# Patient Record
Sex: Female | Born: 1955
Health system: Southern US, Community
[De-identification: ages and names within clinical notes are randomized; demographics above are authoritative.]

## PROBLEM LIST (undated history)

## (undated) DIAGNOSIS — E119 Type 2 diabetes mellitus without complications: Secondary | ICD-10-CM

## (undated) DIAGNOSIS — R112 Nausea with vomiting, unspecified: Secondary | ICD-10-CM

## (undated) DIAGNOSIS — Z9889 Other specified postprocedural states: Secondary | ICD-10-CM

## (undated) DIAGNOSIS — I10 Essential (primary) hypertension: Secondary | ICD-10-CM

## (undated) DIAGNOSIS — D229 Melanocytic nevi, unspecified: Secondary | ICD-10-CM

## (undated) DIAGNOSIS — C801 Malignant (primary) neoplasm, unspecified: Secondary | ICD-10-CM

## (undated) DIAGNOSIS — E785 Hyperlipidemia, unspecified: Secondary | ICD-10-CM

## (undated) DIAGNOSIS — H269 Unspecified cataract: Secondary | ICD-10-CM

## (undated) DIAGNOSIS — E079 Disorder of thyroid, unspecified: Secondary | ICD-10-CM

## (undated) DIAGNOSIS — T7840XA Allergy, unspecified, initial encounter: Secondary | ICD-10-CM

## (undated) HISTORY — DX: Unspecified cataract: H26.9

## (undated) HISTORY — DX: Allergy, unspecified, initial encounter: T78.40XA

## (undated) HISTORY — DX: Essential (primary) hypertension: I10

## (undated) HISTORY — DX: Malignant (primary) neoplasm, unspecified: C80.1

## (undated) HISTORY — DX: Disorder of thyroid, unspecified: E07.9

## (undated) HISTORY — PX: EYE SURGERY: SHX253

---
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
## 1978-04-01 HISTORY — PX: OOPHORECTOMY: SHX86

## 2000-02-05 ENCOUNTER — Other Ambulatory Visit: Admission: RE | Admit: 2000-02-05 | Discharge: 2000-02-05 | Payer: Self-pay | Admitting: Obstetrics & Gynecology

## 2001-04-13 ENCOUNTER — Other Ambulatory Visit: Admission: RE | Admit: 2001-04-13 | Discharge: 2001-04-13 | Payer: Self-pay | Admitting: Obstetrics & Gynecology

## 2002-08-03 ENCOUNTER — Other Ambulatory Visit: Admission: RE | Admit: 2002-08-03 | Discharge: 2002-08-03 | Payer: Self-pay | Admitting: Obstetrics & Gynecology

## 2003-09-07 ENCOUNTER — Other Ambulatory Visit: Admission: RE | Admit: 2003-09-07 | Discharge: 2003-09-07 | Payer: Self-pay | Admitting: Obstetrics & Gynecology

## 2004-02-21 ENCOUNTER — Ambulatory Visit: Payer: Self-pay | Admitting: Family Medicine

## 2004-05-28 ENCOUNTER — Ambulatory Visit: Payer: Self-pay | Admitting: Family Medicine

## 2004-08-07 ENCOUNTER — Ambulatory Visit: Payer: Self-pay | Admitting: Family Medicine

## 2004-11-19 ENCOUNTER — Ambulatory Visit: Payer: Self-pay | Admitting: Family Medicine

## 2004-11-22 ENCOUNTER — Ambulatory Visit: Payer: Self-pay | Admitting: Family Medicine

## 2005-11-08 ENCOUNTER — Ambulatory Visit: Payer: Self-pay | Admitting: Family Medicine

## 2005-12-05 ENCOUNTER — Ambulatory Visit: Payer: Self-pay | Admitting: Gastroenterology

## 2005-12-16 ENCOUNTER — Ambulatory Visit: Payer: Self-pay | Admitting: Gastroenterology

## 2005-12-16 LAB — HM COLONOSCOPY: HM Colonoscopy: NORMAL

## 2006-01-15 LAB — HM MAMMOGRAPHY: HM Mammogram: NORMAL

## 2006-10-20 ENCOUNTER — Ambulatory Visit: Payer: Self-pay | Admitting: Family Medicine

## 2006-10-20 DIAGNOSIS — E039 Hypothyroidism, unspecified: Secondary | ICD-10-CM | POA: Insufficient documentation

## 2006-10-20 DIAGNOSIS — J209 Acute bronchitis, unspecified: Secondary | ICD-10-CM | POA: Insufficient documentation

## 2006-10-27 ENCOUNTER — Telehealth (INDEPENDENT_AMBULATORY_CARE_PROVIDER_SITE_OTHER): Payer: Self-pay | Admitting: *Deleted

## 2006-11-25 ENCOUNTER — Telehealth (INDEPENDENT_AMBULATORY_CARE_PROVIDER_SITE_OTHER): Payer: Self-pay | Admitting: *Deleted

## 2006-12-04 ENCOUNTER — Ambulatory Visit: Payer: Self-pay | Admitting: Family Medicine

## 2006-12-04 ENCOUNTER — Telehealth (INDEPENDENT_AMBULATORY_CARE_PROVIDER_SITE_OTHER): Payer: Self-pay | Admitting: *Deleted

## 2006-12-05 ENCOUNTER — Encounter (INDEPENDENT_AMBULATORY_CARE_PROVIDER_SITE_OTHER): Payer: Self-pay | Admitting: *Deleted

## 2006-12-08 ENCOUNTER — Ambulatory Visit: Payer: Self-pay | Admitting: Family Medicine

## 2006-12-08 DIAGNOSIS — R21 Rash and other nonspecific skin eruption: Secondary | ICD-10-CM | POA: Insufficient documentation

## 2006-12-08 DIAGNOSIS — G43009 Migraine without aura, not intractable, without status migrainosus: Secondary | ICD-10-CM | POA: Insufficient documentation

## 2006-12-11 ENCOUNTER — Encounter (INDEPENDENT_AMBULATORY_CARE_PROVIDER_SITE_OTHER): Payer: Self-pay | Admitting: *Deleted

## 2006-12-16 ENCOUNTER — Encounter (INDEPENDENT_AMBULATORY_CARE_PROVIDER_SITE_OTHER): Payer: Self-pay | Admitting: *Deleted

## 2006-12-16 LAB — CONVERTED CEMR LAB
Alkaline Phosphatase: 74 units/L (ref 39–117)
BUN: 8 mg/dL (ref 6–23)
Basophils Relative: 0.3 % (ref 0.0–1.0)
Bilirubin, Direct: 0.1 mg/dL (ref 0.0–0.3)
CO2: 34 meq/L — ABNORMAL HIGH (ref 19–32)
Cholesterol: 176 mg/dL (ref 0–200)
GFR calc Af Amer: 97 mL/min
HDL: 47.1 mg/dL (ref >39.0)
Hemoglobin: 13.6 g/dL (ref 12.0–15.0)
Lymphocytes Relative: 27.1 % (ref 12.0–46.0)
MCHC: 34.8 g/dL (ref 30.0–36.0)
MCV: 92.8 fL (ref 78.0–100.0)
Monocytes Absolute: 0.8 10*3/uL — ABNORMAL HIGH (ref 0.2–0.7)
Monocytes Relative: 6.5 % (ref 3.0–11.0)
Neutro Abs: 7.7 10*3/uL (ref 1.4–7.7)
Potassium: 3.3 meq/L — ABNORMAL LOW (ref 3.5–5.1)
Total Protein: 7.3 g/dL (ref 6.0–8.3)

## 2006-12-29 ENCOUNTER — Ambulatory Visit: Payer: Self-pay | Admitting: Family Medicine

## 2006-12-31 ENCOUNTER — Encounter (INDEPENDENT_AMBULATORY_CARE_PROVIDER_SITE_OTHER): Payer: Self-pay | Admitting: *Deleted

## 2006-12-31 LAB — CONVERTED CEMR LAB
Basophils Relative: 0 % (ref 0.0–1.0)
Eosinophils Relative: 4.6 % (ref 0.0–5.0)
HCT: 38.6 % (ref 36.0–46.0)
MCV: 91.8 fL (ref 78.0–100.0)
Neutrophils Relative %: 62.4 % (ref 43.0–77.0)
RBC: 4.21 M/uL (ref 3.87–5.11)
RDW: 12.6 % (ref 11.5–14.6)
WBC: 9.6 10*3/uL (ref 4.5–10.5)

## 2007-01-13 LAB — CONVERTED CEMR LAB: Pap Smear: NORMAL

## 2007-02-24 ENCOUNTER — Ambulatory Visit: Payer: Self-pay | Admitting: Internal Medicine

## 2007-02-24 DIAGNOSIS — J019 Acute sinusitis, unspecified: Secondary | ICD-10-CM

## 2007-11-12 ENCOUNTER — Ambulatory Visit: Payer: Self-pay | Admitting: Family Medicine

## 2007-11-12 DIAGNOSIS — J309 Allergic rhinitis, unspecified: Secondary | ICD-10-CM | POA: Insufficient documentation

## 2007-11-13 ENCOUNTER — Encounter (INDEPENDENT_AMBULATORY_CARE_PROVIDER_SITE_OTHER): Payer: Self-pay | Admitting: *Deleted

## 2007-11-13 LAB — CONVERTED CEMR LAB: TSH: 5.11 microintl units/mL (ref 0.35–5.50)

## 2008-11-16 ENCOUNTER — Ambulatory Visit: Payer: Self-pay | Admitting: Family Medicine

## 2008-11-16 LAB — CONVERTED CEMR LAB
Bilirubin Urine: NEGATIVE
Blood in Urine, dipstick: NEGATIVE
Glucose, Urine, Semiquant: NEGATIVE
Ketones, urine, test strip: NEGATIVE
Specific Gravity, Urine: 1.015
pH: 6

## 2008-11-23 ENCOUNTER — Encounter (INDEPENDENT_AMBULATORY_CARE_PROVIDER_SITE_OTHER): Payer: Self-pay | Admitting: *Deleted

## 2008-11-23 LAB — CONVERTED CEMR LAB
AST: 22 units/L (ref 0–37)
Albumin: 4.4 g/dL (ref 3.5–5.2)
BUN: 9 mg/dL (ref 6–23)
Basophils Absolute: 0 10*3/uL (ref 0.0–0.1)
CO2: 32 meq/L (ref 19–32)
Calcium: 9.1 mg/dL (ref 8.4–10.5)
Direct LDL: 117.9 mg/dL
Eosinophils Absolute: 0.4 10*3/uL (ref 0.0–0.7)
Free T4: 0.8 ng/dL (ref 0.6–1.6)
GFR calc non Af Amer: 110.44 mL/min (ref >60)
Glucose, Bld: 91 mg/dL (ref 70–99)
HCT: 41.2 % (ref 36.0–46.0)
HDL: 38.4 mg/dL — ABNORMAL LOW (ref >39.00)
Lymphocytes Relative: 44.9 % (ref 12.0–46.0)
Lymphs Abs: 3 10*3/uL (ref 0.7–4.0)
MCHC: 34.3 g/dL (ref 30.0–36.0)
Monocytes Relative: 6.2 % (ref 3.0–12.0)
Platelets: 194 10*3/uL (ref 150.0–400.0)
RDW: 12.4 % (ref 11.5–14.6)
T3, Free: 2.7 pg/mL (ref 2.3–4.2)
TSH: 4.82 microintl units/mL (ref 0.35–5.50)
Total Bilirubin: 0.9 mg/dL (ref 0.3–1.2)
Triglycerides: 204 mg/dL — ABNORMAL HIGH (ref 0.0–149.0)

## 2008-12-26 ENCOUNTER — Ambulatory Visit: Payer: Self-pay | Admitting: Family Medicine

## 2008-12-26 DIAGNOSIS — I10 Essential (primary) hypertension: Secondary | ICD-10-CM

## 2009-01-19 ENCOUNTER — Ambulatory Visit: Payer: Self-pay | Admitting: Family Medicine

## 2009-02-05 IMAGING — CR DG CHEST 2V
2 series · 2 of 2 positions shown · non-contrast
Comparison: None.

CLINICAL DATA: Bronchitis.

CHEST - 2 VIEW  12/04/2006:

[view not recorded (1 of 2)]
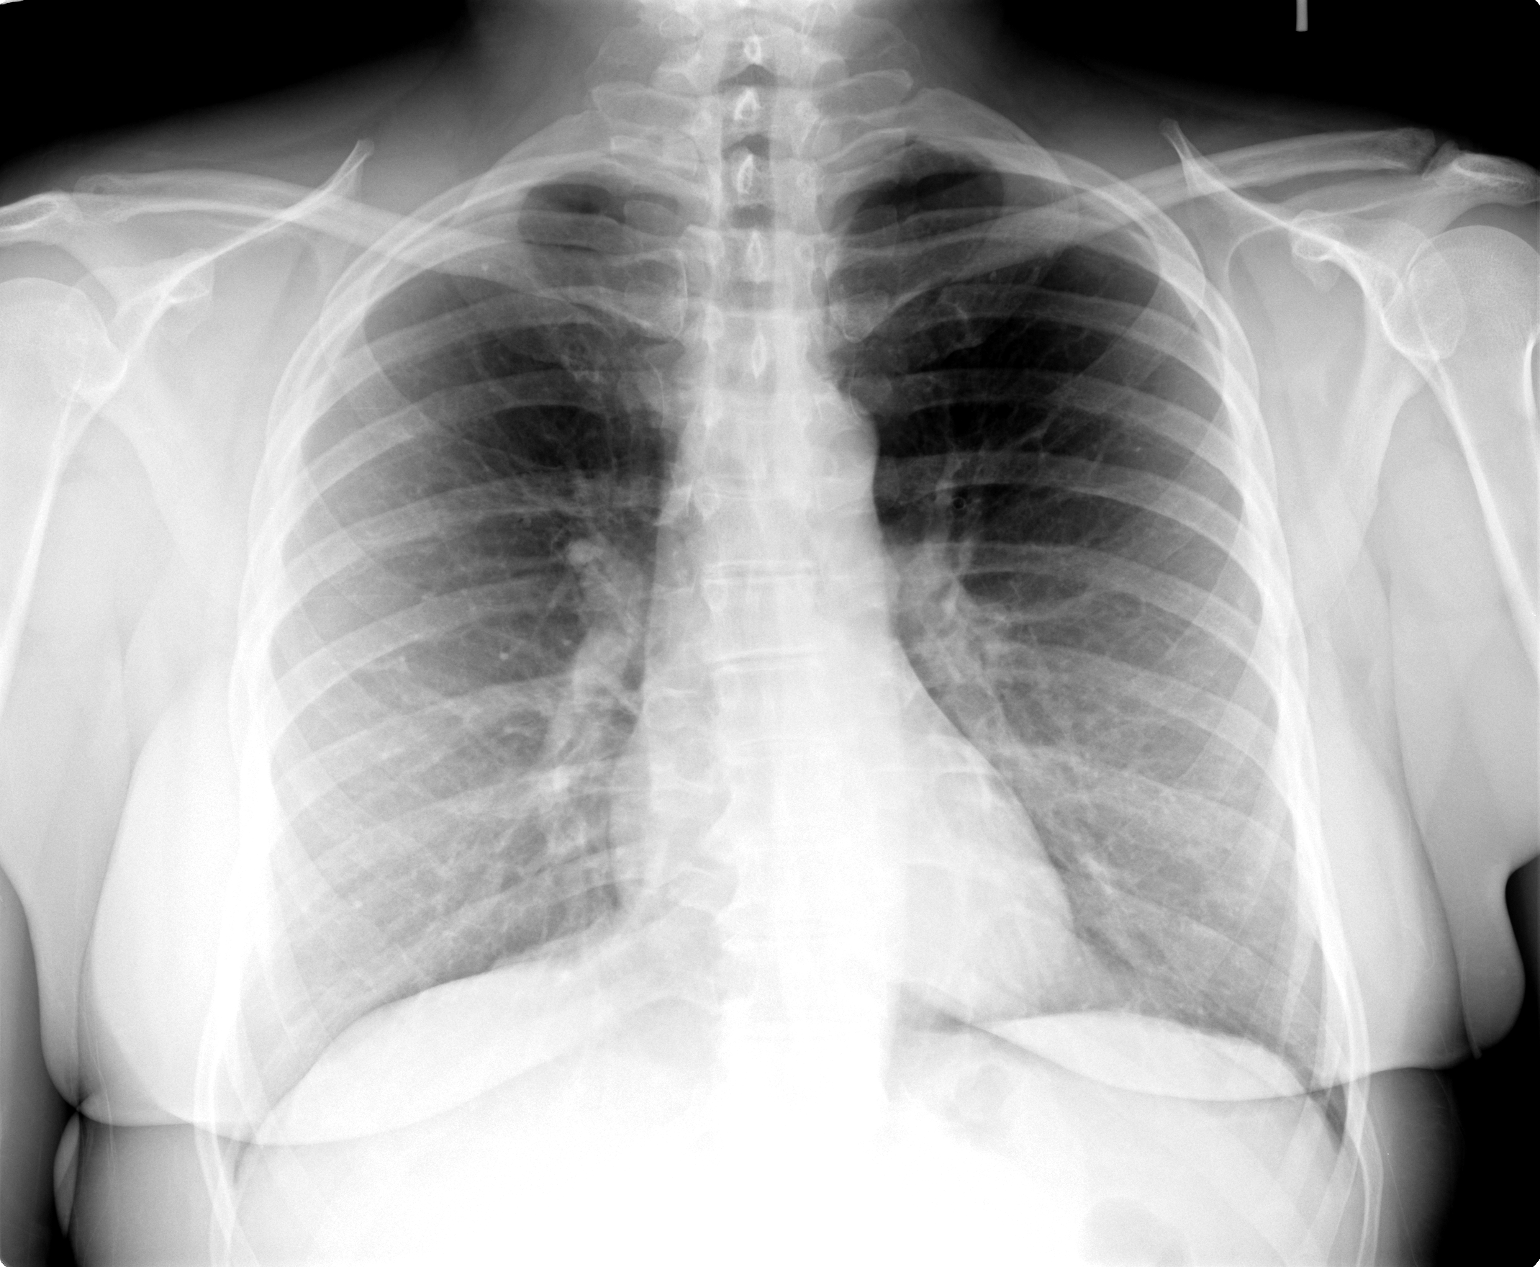

[view not recorded (2 of 2)]
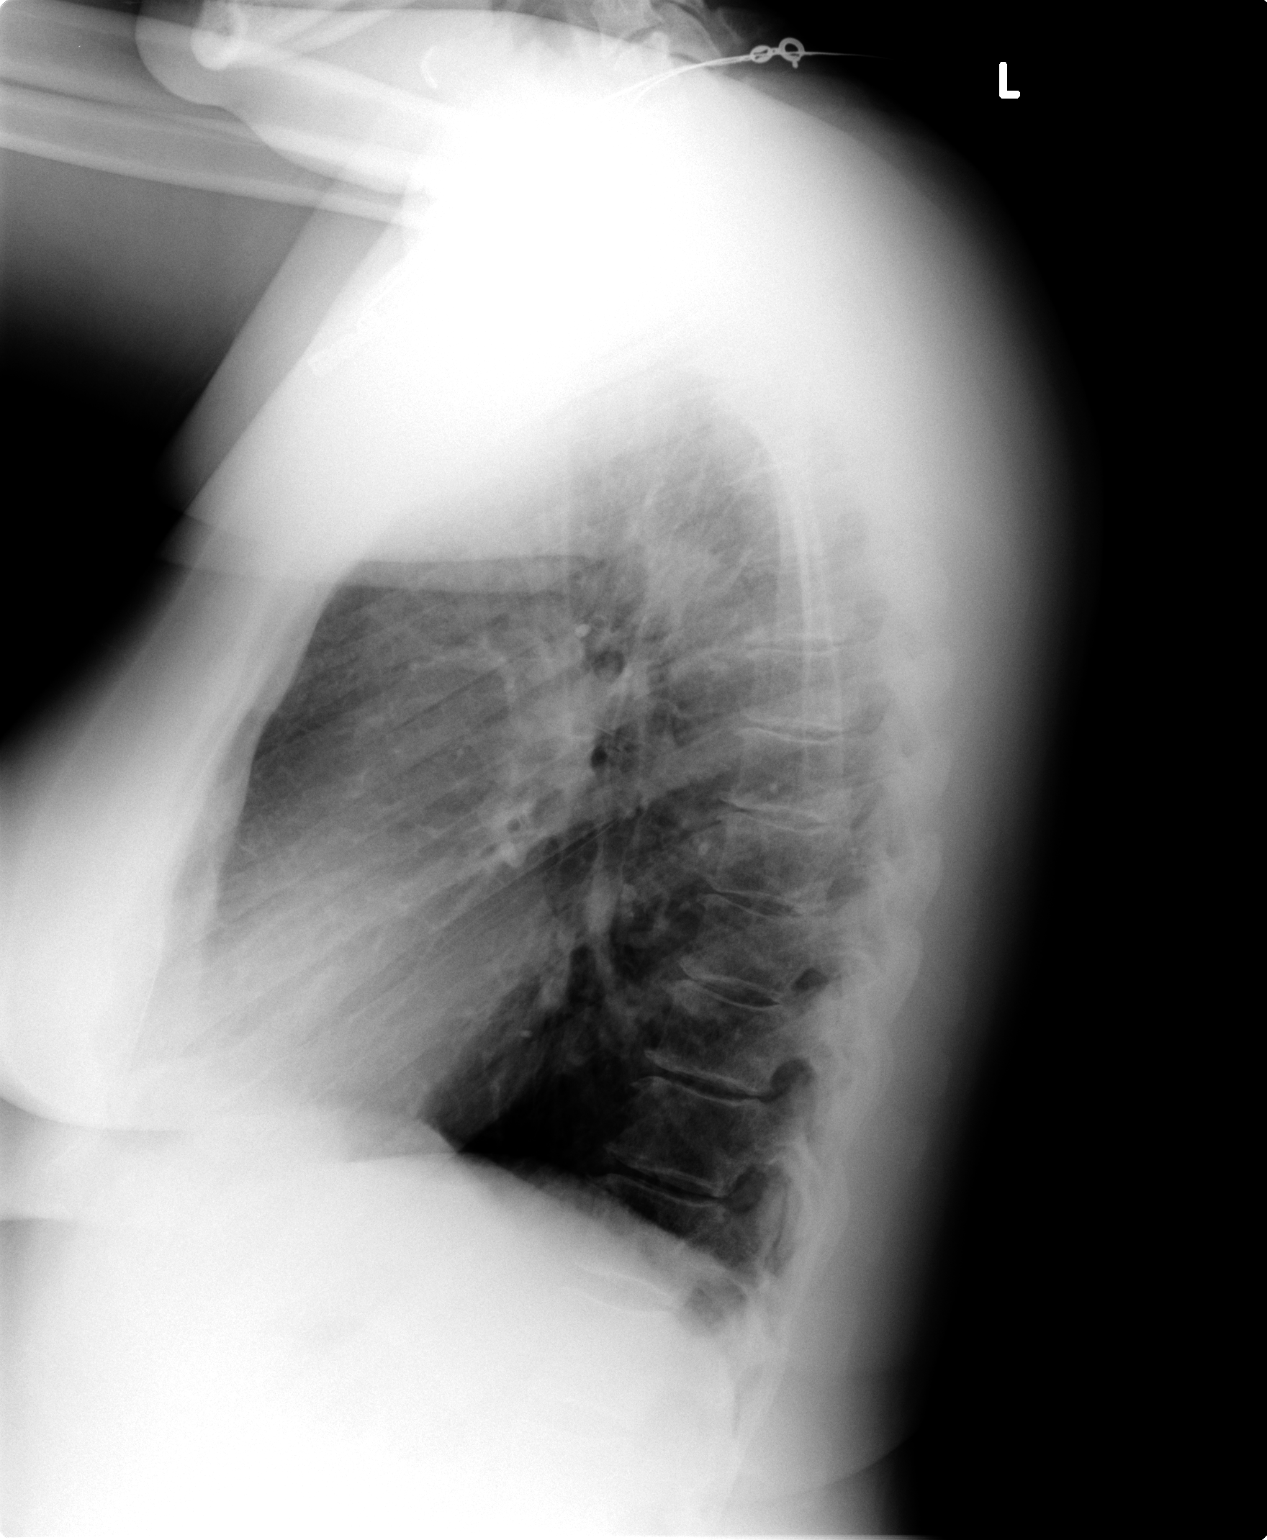

[2 of 2 positions shown; findings below may reference images not displayed]

FINDINGS: Cardiomediastinal silhouette unremarkable. Lungs clear.
Bronchovascular markings normal. No pleural effusions. Mild degenerative changes
throughout the thoracic spine.
IMPRESSION: No acute cardiopulmonary disease.

## 2009-02-17 ENCOUNTER — Ambulatory Visit: Payer: Self-pay | Admitting: Family Medicine

## 2009-03-01 ENCOUNTER — Telehealth (INDEPENDENT_AMBULATORY_CARE_PROVIDER_SITE_OTHER): Payer: Self-pay | Admitting: *Deleted

## 2009-03-01 LAB — CONVERTED CEMR LAB
AST: 20 units/L (ref 0–37)
Albumin: 4.4 g/dL (ref 3.5–5.2)
Cholesterol: 192 mg/dL (ref 0–200)
HDL: 38 mg/dL — ABNORMAL LOW (ref >39.00)
Total Bilirubin: 0.8 mg/dL (ref 0.3–1.2)
Total CHOL/HDL Ratio: 5
Triglycerides: 291 mg/dL — ABNORMAL HIGH (ref 0.0–149.0)
VLDL: 58.2 mg/dL — ABNORMAL HIGH (ref 0.0–40.0)

## 2009-07-05 ENCOUNTER — Telehealth (INDEPENDENT_AMBULATORY_CARE_PROVIDER_SITE_OTHER): Payer: Self-pay | Admitting: *Deleted

## 2009-10-16 ENCOUNTER — Ambulatory Visit: Payer: Self-pay | Admitting: Family Medicine

## 2009-10-16 DIAGNOSIS — E785 Hyperlipidemia, unspecified: Secondary | ICD-10-CM

## 2009-10-23 ENCOUNTER — Ambulatory Visit: Payer: Self-pay | Admitting: Family Medicine

## 2009-10-23 LAB — CONVERTED CEMR LAB
AST: 22 units/L (ref 0–37)
Albumin: 4.6 g/dL (ref 3.5–5.2)
Alkaline Phosphatase: 45 units/L (ref 39–117)
Bilirubin, Direct: 0.1 mg/dL (ref 0.0–0.3)
LDL Cholesterol: 81 mg/dL (ref 0–99)
Total Bilirubin: 0.3 mg/dL (ref 0.3–1.2)
Total CHOL/HDL Ratio: 4

## 2009-10-31 ENCOUNTER — Telehealth: Payer: Self-pay | Admitting: Family Medicine

## 2009-10-31 LAB — CONVERTED CEMR LAB: Free T4: 0.87 ng/dL (ref 0.60–1.60)

## 2009-12-26 ENCOUNTER — Telehealth (INDEPENDENT_AMBULATORY_CARE_PROVIDER_SITE_OTHER): Payer: Self-pay | Admitting: *Deleted

## 2010-02-05 ENCOUNTER — Ambulatory Visit: Payer: Self-pay | Admitting: Family Medicine

## 2010-02-07 ENCOUNTER — Ambulatory Visit: Payer: Self-pay | Admitting: Family Medicine

## 2010-02-13 LAB — CONVERTED CEMR LAB
ALT: 24 units/L (ref 0–35)
AST: 22 units/L (ref 0–37)
Alkaline Phosphatase: 45 units/L (ref 39–117)
BUN: 11 mg/dL (ref 6–23)
Chloride: 107 meq/L (ref 96–112)
Cholesterol: 130 mg/dL (ref 0–200)
GFR calc non Af Amer: 81.22 mL/min (ref >60)
Glucose, Bld: 114 mg/dL — ABNORMAL HIGH (ref 70–99)
LDL Cholesterol: 69 mg/dL (ref 0–99)
Potassium: 4.3 meq/L (ref 3.5–5.1)
Sodium: 142 meq/L (ref 135–145)
TSH: 3.44 microintl units/mL (ref 0.35–5.50)
Total Bilirubin: 0.7 mg/dL (ref 0.3–1.2)

## 2010-05-01 NOTE — Progress Notes (Signed)
Summary: BYSTOLIC REFILL  Phone Note Refill Request Message from:  Patient on December 26, 2009 9:55 AM  Refills Requested: Medication #1:  BYSTOLIC 10 MG TABS 1 by mouth once daily [facility]     HER DAYTIME PHONE IS [phone]  Initial call taken by: Jerolyn Shin,  December 26, 2009 9:55 AM  Follow-up for Phone Call        spoke with pharmacy rx not received on 10-23-09. rx verbally given over the phone per rx on 10-23-09..........Marland KitchenFelecia Deloach CMA  December 26, 2009 10:10 AM     Prescriptions: BYSTOLIC 10 MG TABS (NEBIVOLOL HCL) 1 by mouth once daily  #30 Tablet x 11   Entered by:   Jeremy Johann CMA   Authorized by:   Loreen Freud DO   Signed by:   Jeremy Johann CMA on 12/26/2009   Method used:   Telephoned to ...       [facility] [phone]* (retail)       [address]       [place], [place]  [address]       Ph: [phone]       Fax: [phone]   RxID:   [id number]

## 2010-05-01 NOTE — Progress Notes (Signed)
Summary: Synthroid increase  Phone Note Outgoing Call   Summary of Call: synthroid increase. Initial call taken by: Lucious Groves CMA,  October 31, 2009 10:52 AM    New/Updated Medications: LEVOTHROID 75 MCG TABS (LEVOTHYROXINE SODIUM) 1 by mouth qAM Prescriptions: LEVOTHROID 75 MCG TABS (LEVOTHYROXINE SODIUM) 1 by mouth qAM  #30 x 2   Entered by:   Lucious Groves CMA   Authorized by:   Loreen Freud DO   Signed by:   Lucious Groves CMA on 10/31/2009   Method used:   Electronically to        [facility] [phone]* (retail)       [address]       [place], [place]  [address]       Ph: [phone]       Fax: [phone]   RxID:   [id number]

## 2010-05-01 NOTE — Assessment & Plan Note (Signed)
Summary: follow-up BP,labs//fd   Vital Signs:  Patient profile:   55 year old female Weight:      206.4 pounds Pulse rate:   76 / minute Pulse rhythm:   regular BP sitting:   118 / 70  (right arm) Cuff size:   large  Vitals Entered By: Almeta Monas CMA Duncan Dull) (February 05, 2010 11:05 AM) CC: f/u on BP and meds-- No concerns   History of Present Illness:  Hypertension follow-up      This is a 55 year old woman who presents for Hypertension follow-up.  The patient denies lightheadedness, urinary frequency, headaches, edema, impotence, rash, and fatigue.  The patient denies the following associated symptoms: chest pain, chest pressure, exercise intolerance, dyspnea, palpitations, syncope, leg edema, and pedal edema.  Compliance with medications (by patient report) has been near 100%.  The patient reports that dietary compliance has been good.  The patient reports no exercise.  Adjunctive measures currently used by the patient include salt restriction.    Current Medications (verified): 1)  Bystolic 10 Mg Tabs (Nebivolol Hcl) .Marland Kitchen.. 1 By Mouth Once Daily 2)  Antara 130 Mg Caps (Fenofibrate Micronized) .... Take One At Bedtime 3)  Dexedrin Ephedra Free 4)  Essential Women 5)  Synthroid 75 Mcg Tabs (Levothyroxine Sodium) .Marland Kitchen.. 1 By Mouth Once Daily ** Labs Are Due**  Allergies (verified): 1)  ! Zithromax  Past History:  Past medical, surgical, family and social histories (including risk factors) reviewed for relevance to current acute and chronic problems.  Past Medical History: Reviewed history from 12/26/2008 and no changes required. Hypothyroidism Hypertension  Past Surgical History: Reviewed history from 12/08/2006 and no changes required. Oophorectomy (1980), R  Family History: Reviewed history from 12/08/2006 and no changes required. Family History Hypertension Family History Thyroid disease  Social History: Reviewed history from 12/08/2006 and no changes  required. 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 Lungs are clear to auscultation, no crackles or wheezes. Heart:  normal rate and no murmur.   Extremities:  trace left pedal edema and trace right pedal edema.   Psych:  Cognition and judgment appear intact. Alert and cooperative with normal attention span and concentration. No apparent delusions, illusions, hallucinations   Impression & Recommendations:  Problem # 1:  HYPERLIPIDEMIA (ICD-272.4)  Her updated medication list for this problem includes:    Antara 130 Mg Caps (Fenofibrate micronized) .Marland Kitchen... Take one at bedtime  Labs Reviewed: SGOT: 22 (10/16/2009)   SGPT: 27 (10/16/2009)   HDL:40.30 (10/16/2009), 38.00 (02/17/2009)  LDL:81 (10/16/2009), 93 (12/08/2006)  Chol:155 (10/16/2009), 192 (02/17/2009)  Trig:167.0 (10/16/2009), 291.0 (02/17/2009)  Problem # 2:  HYPERTENSION (ICD-401.9)  Her updated medication list for this problem includes:    Bystolic 10 Mg Tabs (Nebivolol hcl) .Marland Kitchen... 1 by mouth once daily  BP today: 118/70 Prior BP: 130/70 (10/23/2009)  Labs Reviewed: K+: 3.4 (11/16/2008) Creat: : 0.6 (11/16/2008)   Chol: 155 (10/16/2009)   HDL: 40.30 (10/16/2009)   LDL: 81 (10/16/2009)   TG: 167.0 (10/16/2009)  Problem # 3:  HYPOTHYROIDISM (ICD-244.9)  Her updated medication list for this problem includes:    Synthroid 75 Mcg Tabs (Levothyroxine sodium) .Marland Kitchen... 1 by mouth once daily ** labs are due**  Labs Reviewed: TSH: 6.10 (10/23/2009)  Chol: 155 (10/16/2009)   HDL: 40.30 (10/16/2009)   LDL: 81 (10/16/2009)   TG: 167.0 (10/16/2009)  Complete Medication List: 1)  Bystolic 10 Mg Tabs (Nebivolol hcl) .Marland Kitchen.. 1 by  mouth once daily 2)  Antara 130 Mg Caps (Fenofibrate micronized) .... Take one at bedtime 3)  Dexedrin Ephedra Free  4)  Essential Women  5)  Synthroid 75 Mcg Tabs (Levothyroxine sodium) .Marland Kitchen.. 1 by mouth once daily ** labs are due**  Other Orders: Venipuncture ([address]242) TLB-Lipid Panel (80061-LIPID) TLB-BMP (Basic Metabolic Panel-BMET) (80048-METABOL) TLB-Hepatic/Liver Function Pnl (80076-HEPATIC) TLB-TSH (Thyroid Stimulating Hormone) (84443-TSH)  Patient Instructions: 1)  272.4  401.9  244.9   TSH, lipid, bmp, hep-----fasting labs 2)  Please schedule a follow-up appointment in 6 months .  Prescriptions: SYNTHROID 75 MCG TABS (LEVOTHYROXINE SODIUM) 1 by mouth once daily ** Labs are due**  #30 x 11   Entered and Authorized by:   Loreen Freud DO   Signed by:   Loreen Freud DO on 02/05/2010   Method used:   Electronically to        [facility] [phone]* (retail)       [address]       [place], [place]  [address]       Ph: [phone]       Fax: [phone]   RxID:   [id number] Lestine Mount 130 MG CAPS (FENOFIBRATE MICRONIZED) take one at bedtime  #30 x 5   Entered and Authorized by:   Loreen Freud DO   Signed by:   Loreen Freud DO on 02/05/2010   Method used:   Electronically to        [facility] [phone]* (retail)       [address]       [place], [place]  [address]       Ph: [phone]       Fax: [phone]   RxID:   [id number] BYSTOLIC 10 MG TABS (NEBIVOLOL HCL) 1 by mouth once daily  #90 x 3   Entered and Authorized by:   Loreen Freud DO   Signed by:   Loreen Freud DO on 02/05/2010   Method used:   Electronically to        [facility] [phone]* (retail)       [address]       [place], [place]  [address]       Ph: [phone]       Fax: [phone]   RxID:   [id number]    Orders Added: 1)  Venipuncture [70962] 2)  TLB-Lipid Panel [80061-LIPID] 3)  TLB-BMP  (Basic Metabolic Panel-BMET) [80048-METABOL] 4)  TLB-Hepatic/Liver Function Pnl [80076-HEPATIC] 5)  TLB-TSH (Thyroid Stimulating Hormone) [84443-TSH] 6)  Est. Patient Level III [83662]     Flu Vaccine Next Due:  Refused

## 2010-05-01 NOTE — Assessment & Plan Note (Signed)
Summary: followup on lab and bp/alr   Vital Signs:  Patient profile:   55 year old female Height:      65.75 inches Weight:      208 pounds BMI:     33.95 Temp:     98.2 degrees F oral Pulse rate:   76 / minute BP sitting:   130 / 70  (left arm)  Vitals Entered By: Jeremy Johann CMA (October 23, 2009 9:51 AM) CC: F/U LABS, BP CHECK   History of Present Illness:  Hypertension follow-up      This is a 55 year old woman who presents for Hypertension follow-up.  The patient denies lightheadedness, urinary frequency, headaches, edema, impotence, rash, and fatigue.  The patient denies the following associated symptoms: chest pain, chest pressure, exercise intolerance, dyspnea, palpitations, syncope, leg edema, and pedal edema.  Compliance with medications (by patient report) has been near 100%.  The patient reports that dietary compliance has been good.  Adjunctive measures currently used by the patient include salt restriction.    Hyperlipidemia follow-up      The patient also presents for Hyperlipidemia follow-up.  The patient denies muscle aches, GI upset, abdominal pain, flushing, itching, constipation, diarrhea, and fatigue.  The patient denies the following symptoms: chest pain/pressure, exercise intolerance, dypsnea, palpitations, syncope, and pedal edema.  Compliance with medications (by patient report) has been near 100%.  Dietary compliance has been good.  The patient reports no exercise.    Current Medications (verified): 1)  Levothroid 50 Mcg  Tabs (Levothyroxine Sodium) .Marland Kitchen.. 1 By Mouth Once Daily 2)  Bystolic 10 Mg Tabs (Nebivolol Hcl) .Marland Kitchen.. 1 By Mouth Once Daily 3)  Antara 130 Mg Caps (Fenofibrate Micronized) .... Take One At Bedtime 4)  Dexedrin Ephedra Free 5)  Essential Women  Allergies (verified): 1)  ! Zithromax  Past History:  Past medical, surgical, family and social histories (including risk factors) reviewed for relevance to current acute and chronic  problems.  Past Medical History: Reviewed history from 12/26/2008 and no changes required. Hypothyroidism Hypertension  Past Surgical History: Reviewed history from 12/08/2006 and no changes required. Oophorectomy (1980), R  Family History: Reviewed history from 12/08/2006 and no changes required. Family History Hypertension Family History Thyroid disease  Social History: Reviewed history from 12/08/2006 and no changes required. 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 Lungs:  Normal respiratory effort, chest expands symmetrically. Lungs are clear to auscultation, no crackles or wheezes. Heart:  normal rate and no murmur.   Extremities:  No clubbing, cyanosis, edema, or deformity noted with normal full range of motion of all joints.   Psych:  Cognition and judgment appear intact. Alert and cooperative with normal attention span and concentration. No apparent delusions, illusions, hallucinations   Impression & Recommendations:  Problem # 1:  HYPERLIPIDEMIA (ICD-272.4)  Her updated medication list for this problem includes:    Antara 130 Mg Caps (Fenofibrate micronized) .Marland Kitchen... Take one at bedtime  Labs Reviewed: SGOT: 20 (02/17/2009)   SGPT: 24 (02/17/2009)   HDL:38.00 (02/17/2009), 38.40 (11/16/2008)  LDL:93 (12/08/2006)  Chol:192 (02/17/2009), 186 (11/16/2008)  Trig:291.0 (02/17/2009), 204.0 (11/16/2008)  Problem # 2:  HYPERTENSION (ICD-401.9)  Her updated medication list for this problem includes:    Bystolic 10 Mg  Tabs (Nebivolol hcl) .Marland Kitchen... 1 by mouth once daily  BP today: 130/70 Prior BP: 126/86 (02/17/2009)  Labs Reviewed: K+: 3.4 (11/16/2008) Creat: : 0.6 (11/16/2008)    Chol: 192 (02/17/2009)   HDL: 38.00 (02/17/2009)   LDL: 93 (12/08/2006)   TG: 291.0 (02/17/2009)  Problem # 3:  HYPOTHYROIDISM (ICD-244.9)  Her updated medication list for this problem includes:    Levothroid 50 Mcg Tabs (Levothyroxine sodium) .Marland Kitchen... 1 by mouth once daily  Orders: Venipuncture (81191) TLB-TSH (Thyroid Stimulating Hormone) (84443-TSH) TLB-T3, Free (Triiodothyronine) (84481-T3FREE) TLB-T4 (Thyrox), Free [phone]) Specimen Handling (30865)  Labs Reviewed: TSH: 4.82 (11/16/2008)    Chol: 192 (02/17/2009)   HDL: 38.00 (02/17/2009)   LDL: 93 (12/08/2006)   TG: 291.0 (02/17/2009)  Complete Medication List: 1)  Levothroid 50 Mcg Tabs (Levothyroxine sodium) .Marland Kitchen.. 1 by mouth once daily 2)  Bystolic 10 Mg Tabs (Nebivolol hcl) .Marland Kitchen.. 1 by mouth once daily 3)  Antara 130 Mg Caps (Fenofibrate micronized) .... Take one at bedtime 4)  Dexedrin Ephedra Free  5)  Essential Women

## 2010-05-01 NOTE — Progress Notes (Signed)
Summary: WHEN IS PT DUE FOR OV?  /Dr Laury Axon see  Phone Note Call from Patient   Caller: Patient Summary of Call: pt called says she is almost out of her BP med Bystolic. -Mentioned to pt due for 3mos followup on her triglycerides, pt never started her rx for Antara, after finishing her samples never got rx Explained to pt triglycerides were high and needed to be on med, [facility] along with rx for Bystolic DR Lowne since pt did not start her Antara and will pickup today. WHEN DO YOU WANT PT TO COME IN FOR LAB AND FOLLOWUP ON BP? Initial call taken by: Kandice Hams,  July 05, 2009 3:39 PM  Follow-up for Phone Call        3 months --- labs 1 week before ov Follow-up by: Loreen Freud DO,  July 06, 2009 10:52 AM  Additional Follow-up for Phone Call Additional follow up Details #1::        pt informed and lab and ov scheduled .Kandice Hams  July 06, 2009 11:40 AM  Additional Follow-up by: Kandice Hams,  July 06, 2009 11:40 AM    Prescriptions: ANTARA 130 MG CAPS (FENOFIBRATE MICRONIZED) take one at bedtime  #30 x 2   Entered by:   Kandice Hams   Authorized by:   Loreen Freud DO   Signed by:   Kandice Hams on 07/05/2009   Method used:   Faxed to ...       [facility] [phone]* (retail)       [address]       [place], [place]  [address]       Ph: [phone]       Fax: [phone]   RxID:   [id number] BYSTOLIC 10 MG TABS (NEBIVOLOL HCL) 1 by mouth once daily  #30 x 2   Entered by:   Kandice Hams   Authorized by:   Loreen Freud DO   Signed by:   Kandice Hams on 07/05/2009   Method used:   Faxed to ...       [facility] [phone]* (retail)       [address]       [place], [place]  [address]       Ph: [phone]       Fax: [phone]   RxID:   [id number]

## 2010-09-10 ENCOUNTER — Other Ambulatory Visit: Payer: Self-pay | Admitting: Family Medicine

## 2010-09-11 MED ORDER — FENOFIBRATE MICRONIZED 130 MG PO CAPS: 130.0000 mg | ORAL_CAPSULE | Freq: Every day | ORAL | Status: DC

## 2010-10-10 ENCOUNTER — Encounter: Payer: Self-pay | Admitting: Family Medicine

## 2010-10-12 ENCOUNTER — Ambulatory Visit (INDEPENDENT_AMBULATORY_CARE_PROVIDER_SITE_OTHER): Payer: Managed Care, Other (non HMO) | Admitting: Family Medicine

## 2010-10-12 ENCOUNTER — Encounter: Payer: Self-pay | Admitting: Family Medicine

## 2010-10-12 VITALS — BP 124/82 | HR 69 | Temp 98.7°F | Ht 65.5 in | Wt 209.2 lb

## 2010-10-12 DIAGNOSIS — E039 Hypothyroidism, unspecified: Secondary | ICD-10-CM

## 2010-10-12 DIAGNOSIS — I10 Essential (primary) hypertension: Secondary | ICD-10-CM

## 2010-10-12 DIAGNOSIS — E785 Hyperlipidemia, unspecified: Secondary | ICD-10-CM

## 2010-10-12 DIAGNOSIS — Z0000 Encounter for general adult medical examination without abnormal findings: Secondary | ICD-10-CM

## 2010-10-12 MED ORDER — LEVOTHYROXINE SODIUM 75 MCG PO TABS: 75.0000 ug | ORAL_TABLET | Freq: Every day | ORAL | Status: DC

## 2010-10-12 MED ORDER — NEBIVOLOL HCL 10 MG PO TABS: 10.0000 mg | ORAL_TABLET | Freq: Every day | ORAL | Status: DC

## 2010-10-12 MED ORDER — FENOFIBRATE MICRONIZED 130 MG PO CAPS: 130.0000 mg | ORAL_CAPSULE | Freq: Every day | ORAL | Status: DC

## 2010-10-12 NOTE — Patient Instructions (Signed)

## 2010-10-12 NOTE — Progress Notes (Signed)
  Subjective:     Diane Hodges is a 55 y.o. female and is here for a comprehensive physical exam. The patient reports incontinence--stress  History   Social History  . Marital Status: Married    Spouse Name: N/A    Number of Children: N/A  . Years of Education: N/A   Occupational History  . Not on file.   Social History Main Topics  . Smoking status: Never Smoker   . Smokeless tobacco: Never Used  . Alcohol Use: No  . Drug Use: No  . Sexually Active: Yes -- Female partner(s)   Other Topics Concern  . Not on file   Social History Narrative  . No narrative on file   Health Maintenance  Topic Date Due  . Influenza Vaccine  12/31/2010  . Mammogram  04/14/2011  . Tetanus/tdap  08/13/2011  . Pap Smear  04/13/2012  . Colonoscopy  12/17/2015    The following portions of the patient's history were reviewed and updated as appropriate: allergies, current medications, past family history, past medical history, past social history, past surgical history and problem list.  Review of Systems Review of Systems  Constitutional: Negative for activity change, appetite change and fatigue.  HENT: Negative for hearing loss, congestion, tinnitus and ear discharge.  dentist q60m Eyes: Negative for visual disturbance (see optho q1y -- vision corrected to 20/20 with glasses).  Respiratory: Negative for cough, chest tightness and shortness of breath.   Cardiovascular: Negative for chest pain, palpitations and leg swelling.  Gastrointestinal: Negative for abdominal pain, diarrhea, constipation and abdominal distention.  Genitourinary: Negative for urgency, frequency, decreased urine volume and difficulty urinating.  Musculoskeletal: Negative for back pain, arthralgias and gait problem.  Skin: Negative for color change, pallor and rash.  Neurological: Negative for dizziness, light-headedness, numbness and headaches.  Hematological: Negative for adenopathy. Does not bruise/bleed easily.    Psychiatric/Behavioral: Negative for suicidal ideas, confusion, sleep disturbance, self-injury, dysphoric mood, decreased concentration and agitation.       Objective:    BP 124/82  Pulse 69  Temp(Src) 98.7 F (37.1 C) (Oral)  Ht 5' 5.5" (1.664 m)  Wt 209 lb 3.2 oz (94.892 kg)  BMI 34.28 kg/m2  SpO2 98% General appearance: alert, cooperative, appears stated age and no distress Head: Normocephalic, without obvious abnormality, atraumatic Eyes: conjunctivae/corneas clear. PERRL, EOM's intact. Fundi benign. Ears: normal TM's and external ear canals both ears Nose: Nares normal. Septum midline. Mucosa normal. No drainage or sinus tenderness. Throat: lips, mucosa, and tongue normal; teeth and gums normal Neck: no adenopathy, no carotid bruit, no JVD, supple, symmetrical, trachea midline and thyroid not enlarged, symmetric, no tenderness/mass/nodules Lungs: clear to auscultation bilaterally Breasts: gyn Heart: regular rate and rhythm, S1, S2 normal, no murmur, click, rub or gallop Abdomen: soft, non-tender; bowel sounds normal; no masses,  no organomegaly Pelvic: gyn Extremities: extremities normal, atraumatic, no cyanosis or edema Pulses: 2+ and symmetric Skin: Skin color, texture, turgor normal. No rashes or lesions Lymph nodes: Cervical, supraclavicular, and axillary nodes normal. Neurologic: Alert and oriented X 3, normal strength and tone. Normal symmetric reflexes. Normal coordination and gait psych-- no depression, no anxiety    Assessment:    Healthy female exam.  HTN Hypothyroidism hypertriglyceridemia   Plan:     ghm utd Check fasting labs con't med See After Visit Summary for Counseling Recommendations

## 2010-11-12 ENCOUNTER — Other Ambulatory Visit: Payer: Self-pay | Admitting: Family Medicine

## 2010-11-12 DIAGNOSIS — I10 Essential (primary) hypertension: Secondary | ICD-10-CM

## 2010-11-12 DIAGNOSIS — E785 Hyperlipidemia, unspecified: Secondary | ICD-10-CM

## 2010-11-12 DIAGNOSIS — E039 Hypothyroidism, unspecified: Secondary | ICD-10-CM

## 2010-11-12 DIAGNOSIS — Z0000 Encounter for general adult medical examination without abnormal findings: Secondary | ICD-10-CM

## 2010-11-13 ENCOUNTER — Other Ambulatory Visit (INDEPENDENT_AMBULATORY_CARE_PROVIDER_SITE_OTHER): Payer: Managed Care, Other (non HMO)

## 2010-11-13 DIAGNOSIS — I10 Essential (primary) hypertension: Secondary | ICD-10-CM

## 2010-11-13 DIAGNOSIS — Z0000 Encounter for general adult medical examination without abnormal findings: Secondary | ICD-10-CM

## 2010-11-13 DIAGNOSIS — E785 Hyperlipidemia, unspecified: Secondary | ICD-10-CM

## 2010-11-13 DIAGNOSIS — R319 Hematuria, unspecified: Secondary | ICD-10-CM

## 2010-11-13 DIAGNOSIS — E039 Hypothyroidism, unspecified: Secondary | ICD-10-CM

## 2010-11-13 DIAGNOSIS — N390 Urinary tract infection, site not specified: Secondary | ICD-10-CM

## 2010-11-13 LAB — POCT URINALYSIS DIPSTICK
Bilirubin, UA: NEGATIVE
Glucose, UA: NEGATIVE
Ketones, UA: NEGATIVE
Nitrite, UA: NEGATIVE
Protein, UA: NEGATIVE
Spec Grav, UA: 1.015
Urobilinogen, UA: 0.2
pH, UA: 6.5

## 2010-11-13 LAB — CBC WITH DIFFERENTIAL/PLATELET
Basophils Absolute: 0 K/uL (ref 0.0–0.1)
Basophils Relative: 0.6 % (ref 0.0–3.0)
Eosinophils Absolute: 0.2 K/uL (ref 0.0–0.7)
Eosinophils Relative: 2.8 % (ref 0.0–5.0)
HCT: 38.9 % (ref 36.0–46.0)
Hemoglobin: 13.1 g/dL (ref 12.0–15.0)
Lymphocytes Relative: 39.2 % (ref 12.0–46.0)
Lymphs Abs: 2.4 K/uL (ref 0.7–4.0)
MCHC: 33.6 g/dL (ref 30.0–36.0)
MCV: 95.1 fl (ref 78.0–100.0)
Monocytes Absolute: 0.4 K/uL (ref 0.1–1.0)
Monocytes Relative: 6.3 % (ref 3.0–12.0)
Neutro Abs: 3.1 K/uL (ref 1.4–7.7)
Neutrophils Relative %: 51.1 % (ref 43.0–77.0)
Platelets: 188 K/uL (ref 150.0–400.0)
RBC: 4.09 Mil/uL (ref 3.87–5.11)
RDW: 13.2 % (ref 11.5–14.6)
WBC: 6 K/uL (ref 4.5–10.5)

## 2010-11-13 LAB — HEPATIC FUNCTION PANEL
ALT: 23 U/L (ref 0–35)
AST: 21 U/L (ref 0–37)
Albumin: 4.4 g/dL (ref 3.5–5.2)
Alkaline Phosphatase: 40 U/L (ref 39–117)
Bilirubin, Direct: 0 mg/dL (ref 0.0–0.3)
Total Bilirubin: 0.4 mg/dL (ref 0.3–1.2)
Total Protein: 7 g/dL (ref 6.0–8.3)

## 2010-11-13 LAB — BASIC METABOLIC PANEL WITH GFR
BUN: 11 mg/dL (ref 6–23)
CO2: 27 meq/L (ref 19–32)
Calcium: 9.1 mg/dL (ref 8.4–10.5)
Chloride: 107 meq/L (ref 96–112)
Creatinine, Ser: 0.7 mg/dL (ref 0.4–1.2)
GFR: 88.84 mL/min
Glucose, Bld: 122 mg/dL — ABNORMAL HIGH (ref 70–99)
Potassium: 3.8 meq/L (ref 3.5–5.1)
Sodium: 142 meq/L (ref 135–145)

## 2010-11-13 LAB — LIPID PANEL
HDL: 44.3 mg/dL (ref >39.00)
Total CHOL/HDL Ratio: 3

## 2010-11-13 NOTE — Progress Notes (Signed)
Labs only

## 2010-11-13 NOTE — Progress Notes (Signed)
12  

## 2010-11-15 ENCOUNTER — Encounter: Payer: Self-pay | Admitting: Family Medicine

## 2010-11-15 LAB — URINE CULTURE: Colony Count: 40000

## 2011-01-04 ENCOUNTER — Other Ambulatory Visit: Payer: Self-pay | Admitting: Family Medicine

## 2011-01-04 DIAGNOSIS — R7989 Other specified abnormal findings of blood chemistry: Secondary | ICD-10-CM

## 2011-01-07 ENCOUNTER — Other Ambulatory Visit (INDEPENDENT_AMBULATORY_CARE_PROVIDER_SITE_OTHER): Payer: Managed Care, Other (non HMO)

## 2011-01-07 DIAGNOSIS — R7989 Other specified abnormal findings of blood chemistry: Secondary | ICD-10-CM

## 2011-01-07 LAB — HEMOGLOBIN A1C: Hgb A1c MFr Bld: 6.2 % (ref 4.6–6.5)

## 2011-01-07 NOTE — Progress Notes (Signed)
12  

## 2011-02-27 ENCOUNTER — Other Ambulatory Visit: Payer: Self-pay | Admitting: Family Medicine

## 2011-02-27 DIAGNOSIS — E785 Hyperlipidemia, unspecified: Secondary | ICD-10-CM

## 2011-02-27 DIAGNOSIS — R7989 Other specified abnormal findings of blood chemistry: Secondary | ICD-10-CM

## 2011-02-27 DIAGNOSIS — I10 Essential (primary) hypertension: Secondary | ICD-10-CM

## 2011-03-01 ENCOUNTER — Other Ambulatory Visit (INDEPENDENT_AMBULATORY_CARE_PROVIDER_SITE_OTHER): Payer: Managed Care, Other (non HMO)

## 2011-03-01 DIAGNOSIS — R7989 Other specified abnormal findings of blood chemistry: Secondary | ICD-10-CM

## 2011-03-01 DIAGNOSIS — E785 Hyperlipidemia, unspecified: Secondary | ICD-10-CM

## 2011-03-01 DIAGNOSIS — I10 Essential (primary) hypertension: Secondary | ICD-10-CM

## 2011-03-01 LAB — HEPATIC FUNCTION PANEL
Alkaline Phosphatase: 47 U/L (ref 39–117)
Bilirubin, Direct: 0 mg/dL (ref 0.0–0.3)
Total Bilirubin: 0.4 mg/dL (ref 0.3–1.2)

## 2011-03-01 LAB — BASIC METABOLIC PANEL
BUN: 14 mg/dL (ref 6–23)
CO2: 28 mEq/L (ref 19–32)
Chloride: 106 mEq/L (ref 96–112)
Creatinine, Ser: 0.9 mg/dL (ref 0.4–1.2)

## 2011-03-01 LAB — LIPID PANEL
LDL Cholesterol: 74 mg/dL (ref 0–99)
Total CHOL/HDL Ratio: 3

## 2011-03-01 NOTE — Progress Notes (Signed)
12  

## 2011-03-06 ENCOUNTER — Other Ambulatory Visit: Payer: Self-pay

## 2011-03-06 DIAGNOSIS — E039 Hypothyroidism, unspecified: Secondary | ICD-10-CM

## 2011-03-06 DIAGNOSIS — I10 Essential (primary) hypertension: Secondary | ICD-10-CM

## 2011-03-06 DIAGNOSIS — E785 Hyperlipidemia, unspecified: Secondary | ICD-10-CM

## 2011-03-06 MED ORDER — FENOFIBRATE MICRONIZED 130 MG PO CAPS: 130.0000 mg | ORAL_CAPSULE | Freq: Every day | ORAL | Status: DC

## 2011-03-06 MED ORDER — LEVOTHYROXINE SODIUM 75 MCG PO TABS: 75.0000 ug | ORAL_TABLET | Freq: Every day | ORAL | Status: DC

## 2011-03-06 MED ORDER — NEBIVOLOL HCL 10 MG PO TABS: 10.0000 mg | ORAL_TABLET | Freq: Every day | ORAL | Status: DC

## 2011-04-10 LAB — HM MAMMOGRAPHY

## 2011-04-22 ENCOUNTER — Other Ambulatory Visit: Payer: Self-pay | Admitting: Family Medicine

## 2011-04-22 DIAGNOSIS — I10 Essential (primary) hypertension: Secondary | ICD-10-CM

## 2011-04-22 DIAGNOSIS — E785 Hyperlipidemia, unspecified: Secondary | ICD-10-CM

## 2011-04-22 DIAGNOSIS — E039 Hypothyroidism, unspecified: Secondary | ICD-10-CM

## 2011-04-22 MED ORDER — FENOFIBRATE MICRONIZED 130 MG PO CAPS: 130.0000 mg | ORAL_CAPSULE | Freq: Every day | ORAL | Status: DC

## 2011-04-22 MED ORDER — NEBIVOLOL HCL 10 MG PO TABS: 10.0000 mg | ORAL_TABLET | Freq: Every day | ORAL | Status: DC

## 2011-04-22 MED ORDER — LEVOTHYROXINE SODIUM 75 MCG PO TABS: 75.0000 ug | ORAL_TABLET | Freq: Every day | ORAL | Status: DC

## 2011-04-22 NOTE — Telephone Encounter (Signed)
Faxed.   KP 

## 2011-10-23 ENCOUNTER — Other Ambulatory Visit: Payer: Self-pay | Admitting: Family Medicine

## 2011-10-23 DIAGNOSIS — E039 Hypothyroidism, unspecified: Secondary | ICD-10-CM

## 2011-10-23 MED ORDER — LEVOTHYROXINE SODIUM 75 MCG PO TABS: 75.0000 ug | ORAL_TABLET | Freq: Every day | ORAL | Status: DC

## 2011-10-23 NOTE — Telephone Encounter (Signed)
Refill Levothyroxine (Tab) 75 MCG Take 1 tablet (75 mcg total) by mouth daily #90 Last fill 4.23.13 Last OV 7.13.12 (CPX) future appt for CPE 8.15.13

## 2011-11-14 ENCOUNTER — Encounter: Payer: Self-pay | Admitting: Family Medicine

## 2011-11-14 ENCOUNTER — Ambulatory Visit (INDEPENDENT_AMBULATORY_CARE_PROVIDER_SITE_OTHER): Payer: Managed Care, Other (non HMO) | Admitting: Family Medicine

## 2011-11-14 VITALS — BP 124/76 | HR 74 | Temp 98.4°F | Ht 65.25 in | Wt 207.8 lb

## 2011-11-14 DIAGNOSIS — Z23 Encounter for immunization: Secondary | ICD-10-CM

## 2011-11-14 DIAGNOSIS — N390 Urinary tract infection, site not specified: Secondary | ICD-10-CM

## 2011-11-14 DIAGNOSIS — Z0000 Encounter for general adult medical examination without abnormal findings: Secondary | ICD-10-CM

## 2011-11-14 DIAGNOSIS — I10 Essential (primary) hypertension: Secondary | ICD-10-CM

## 2011-11-14 DIAGNOSIS — E039 Hypothyroidism, unspecified: Secondary | ICD-10-CM

## 2011-11-14 DIAGNOSIS — E785 Hyperlipidemia, unspecified: Secondary | ICD-10-CM

## 2011-11-14 LAB — CBC WITH DIFFERENTIAL/PLATELET
Basophils Absolute: 0 10*3/uL (ref 0.0–0.1)
HCT: 39.4 % (ref 36.0–46.0)
Lymphocytes Relative: 40.3 % (ref 12.0–46.0)
Lymphs Abs: 2.4 10*3/uL (ref 0.7–4.0)
Monocytes Relative: 6.2 % (ref 3.0–12.0)
Neutrophils Relative %: 48.3 % (ref 43.0–77.0)
Platelets: 190 10*3/uL (ref 150.0–400.0)
RDW: 13 % (ref 11.5–14.6)

## 2011-11-14 LAB — LIPID PANEL
Cholesterol: 140 mg/dL (ref 0–200)
LDL Cholesterol: 71 mg/dL (ref 0–99)
VLDL: 28.4 mg/dL (ref 0.0–40.0)

## 2011-11-14 LAB — BASIC METABOLIC PANEL
BUN: 11 mg/dL (ref 6–23)
Calcium: 9.2 mg/dL (ref 8.4–10.5)
Creatinine, Ser: 0.7 mg/dL (ref 0.4–1.2)
GFR: 94.56 mL/min (ref >60.00)
Glucose, Bld: 123 mg/dL — ABNORMAL HIGH (ref 70–99)

## 2011-11-14 LAB — POCT URINALYSIS DIPSTICK
Protein, UA: NEGATIVE
Spec Grav, UA: >=1.03
Urobilinogen, UA: 0.2

## 2011-11-14 LAB — HEPATIC FUNCTION PANEL
AST: 22 U/L (ref 0–37)
Total Bilirubin: 0.5 mg/dL (ref 0.3–1.2)

## 2011-11-14 LAB — TSH: TSH: 3.85 u[IU]/mL (ref 0.35–5.50)

## 2011-11-14 NOTE — Patient Instructions (Addendum)
Preventive Care for Adults, Female A healthy lifestyle and preventive care can promote health and wellness. Preventive health guidelines for women include the following key practices.  A routine yearly physical is a good way to check with your caregiver about your health and preventive screening. It is a chance to share any concerns and updates on your health, and to receive a thorough exam.   Visit your dentist for a routine exam and preventive care every 6 months. Brush your teeth twice a day and floss once a day. Good oral hygiene prevents tooth decay and gum disease.   The frequency of eye exams is based on your age, health, family medical history, use of contact lenses, and other factors. Follow your caregiver's recommendations for frequency of eye exams.   Eat a healthy diet. Foods like vegetables, fruits, whole grains, low-fat dairy products, and lean protein foods contain the nutrients you need without too many calories. Decrease your intake of foods high in solid fats, added sugars, and salt. Eat the right amount of calories for you.Get information about a proper diet from your caregiver, if necessary.   Regular physical exercise is one of the most important things you can do for your health. Most adults should get at least 150 minutes of moderate-intensity exercise (any activity that increases your heart rate and causes you to sweat) each week. In addition, most adults need muscle-strengthening exercises on 2 or more days a week.   Maintain a healthy weight. The body mass index (BMI) is a screening tool to identify possible weight problems. It provides an estimate of body fat based on height and weight. Your caregiver can help determine your BMI, and can help you achieve or maintain a healthy weight.For adults 20 years and older:   A BMI below 18.5 is considered underweight.   A BMI of 18.5 to 24.9 is normal.   A BMI of 25 to 29.9 is considered overweight.   A BMI of 30 and above is  considered obese.   Maintain normal blood lipids and cholesterol levels by exercising and minimizing your intake of saturated fat. Eat a balanced diet with plenty of fruit and vegetables. Blood tests for lipids and cholesterol should begin at age 20 and be repeated every 5 years. If your lipid or cholesterol levels are high, you are over 50, or you are at high risk for heart disease, you may need your cholesterol levels checked more frequently.Ongoing high lipid and cholesterol levels should be treated with medicines if diet and exercise are not effective.   If you smoke, find out from your caregiver how to quit. If you do not use tobacco, do not start.   If you are pregnant, do not drink alcohol. If you are breastfeeding, be very cautious about drinking alcohol. If you are not pregnant and choose to drink alcohol, do not exceed 1 drink per day. One drink is considered to be 12 ounces (355 mL) of beer, 5 ounces (148 mL) of wine, or 1.5 ounces (44 mL) of liquor.   Avoid use of street drugs. Do not share needles with anyone. Ask for help if you need support or instructions about stopping the use of drugs.   High blood pressure causes heart disease and increases the risk of stroke. Your blood pressure should be checked at least every 1 to 2 years. Ongoing high blood pressure should be treated with medicines if weight loss and exercise are not effective.   If you are 55 to 58   years old, ask your caregiver if you should take aspirin to prevent strokes.   Diabetes screening involves taking a blood sample to check your fasting blood sugar level. This should be done once every 3 years, after age 45, if you are within normal weight and without risk factors for diabetes. Testing should be considered at a younger age or be carried out more frequently if you are overweight and have at least 1 risk factor for diabetes.   Breast cancer screening is essential preventive care for women. You should practice "breast  self-awareness." This means understanding the normal appearance and feel of your breasts and may include breast self-examination. Any changes detected, no matter how small, should be reported to a caregiver. Women in their 20s and 30s should have a clinical breast exam (CBE) by a caregiver as part of a regular health exam every 1 to 3 years. After age 40, women should have a CBE every year. Starting at age 40, women should consider having a mammography (breast X-ray test) every year. Women who have a family history of breast cancer should talk to their caregiver about genetic screening. Women at a high risk of breast cancer should talk to their caregivers about having magnetic resonance imaging (MRI) and a mammography every year.   The Pap test is a screening test for cervical cancer. A Pap test can show cell changes on the cervix that might become cervical cancer if left untreated. A Pap test is a procedure in which cells are obtained and examined from the lower end of the uterus (cervix).   Women should have a Pap test starting at age 21.   Between ages 21 and 29, Pap tests should be repeated every 2 years.   Beginning at age 30, you should have a Pap test every 3 years as long as the past 3 Pap tests have been normal.   Some women have medical problems that increase the chance of getting cervical cancer. Talk to your caregiver about these problems. It is especially important to talk to your caregiver if a new problem develops soon after your last Pap test. In these cases, your caregiver may recommend more frequent screening and Pap tests.   The above recommendations are the same for women who have or have not gotten the vaccine for human papillomavirus (HPV).   If you had a hysterectomy for a problem that was not cancer or a condition that could lead to cancer, then you no longer need Pap tests. Even if you no longer need a Pap test, a regular exam is a good idea to make sure no other problems are  starting.   If you are between ages 65 and 70, and you have had normal Pap tests going back 10 years, you no longer need Pap tests. Even if you no longer need a Pap test, a regular exam is a good idea to make sure no other problems are starting.   If you have had past treatment for cervical cancer or a condition that could lead to cancer, you need Pap tests and screening for cancer for at least 20 years after your treatment.   If Pap tests have been discontinued, risk factors (such as a new sexual partner) need to be reassessed to determine if screening should be resumed.   The HPV test is an additional test that may be used for cervical cancer screening. The HPV test looks for the virus that can cause the cell changes on the cervix.   The cells collected during the Pap test can be tested for HPV. The HPV test could be used to screen women aged 30 years and older, and should be used in women of any age who have unclear Pap test results. After the age of 30, women should have HPV testing at the same frequency as a Pap test.   Colorectal cancer can be detected and often prevented. Most routine colorectal cancer screening begins at the age of 50 and continues through age 75. However, your caregiver may recommend screening at an earlier age if you have risk factors for colon cancer. On a yearly basis, your caregiver may provide home test kits to check for hidden blood in the stool. Use of a small camera at the end of a tube, to directly examine the colon (sigmoidoscopy or colonoscopy), can detect the earliest forms of colorectal cancer. Talk to your caregiver about this at age 50, when routine screening begins. Direct examination of the colon should be repeated every 5 to 10 years through age 75, unless early forms of pre-cancerous polyps or small growths are found.   Hepatitis C blood testing is recommended for all people born from 1945 through 1965 and any individual with known risks for hepatitis C.    Practice safe sex. Use condoms and avoid high-risk sexual practices to reduce the spread of sexually transmitted infections (STIs). STIs include gonorrhea, chlamydia, syphilis, trichomonas, herpes, HPV, and human immunodeficiency virus (HIV). Herpes, HIV, and HPV are viral illnesses that have no cure. They can result in disability, cancer, and death. Sexually active women aged 25 and younger should be checked for chlamydia. Older women with new or multiple partners should also be tested for chlamydia. Testing for other STIs is recommended if you are sexually active and at increased risk.   Osteoporosis is a disease in which the bones lose minerals and strength with aging. This can result in serious bone fractures. The risk of osteoporosis can be identified using a bone density scan. Women ages 65 and over and women at risk for fractures or osteoporosis should discuss screening with their caregivers. Ask your caregiver whether you should take a calcium supplement or vitamin D to reduce the rate of osteoporosis.   Menopause can be associated with physical symptoms and risks. Hormone replacement therapy is available to decrease symptoms and risks. You should talk to your caregiver about whether hormone replacement therapy is right for you.   Use sunscreen with sun protection factor (SPF) of 30 or more. Apply sunscreen liberally and repeatedly throughout the day. You should seek shade when your shadow is shorter than you. Protect yourself by wearing long sleeves, pants, a wide-brimmed hat, and sunglasses year round, whenever you are outdoors.   Once a month, do a whole body skin exam, using a mirror to look at the skin on your back. Notify your caregiver of new moles, moles that have irregular borders, moles that are larger than a pencil eraser, or moles that have changed in shape or color.   Stay current with required immunizations.   Influenza. You need a dose every fall (or winter). The composition of  the flu vaccine changes each year, so being vaccinated once is not enough.   Pneumococcal polysaccharide. You need 1 to 2 doses if you smoke cigarettes or if you have certain chronic medical conditions. You need 1 dose at age 65 (or older) if you have never been vaccinated.   Tetanus, diphtheria, pertussis (Tdap, Td). Get 1 dose of   Tdap vaccine if you are younger than age 65, are over 65 and have contact with an infant, are a healthcare worker, are pregnant, or simply want to be protected from whooping cough. After that, you need a Td booster dose every 10 years. Consult your caregiver if you have not had at least 3 tetanus and diphtheria-containing shots sometime in your life or have a deep or dirty wound.   HPV. You need this vaccine if you are a woman age 26 or younger. The vaccine is given in 3 doses over 6 months.   Measles, mumps, rubella (MMR). You need at least 1 dose of MMR if you were born in 1957 or later. You may also need a second dose.   Meningococcal. If you are age 19 to 21 and a first-year college student living in a residence hall, or have one of several medical conditions, you need to get vaccinated against meningococcal disease. You may also need additional booster doses.   Zoster (shingles). If you are age 60 or older, you should get this vaccine.   Varicella (chickenpox). If you have never had chickenpox or you were vaccinated but received only 1 dose, talk to your caregiver to find out if you need this vaccine.   Hepatitis A. You need this vaccine if you have a specific risk factor for hepatitis A virus infection or you simply wish to be protected from this disease. The vaccine is usually given as 2 doses, 6 to 18 months apart.   Hepatitis B. You need this vaccine if you have a specific risk factor for hepatitis B virus infection or you simply wish to be protected from this disease. The vaccine is given in 3 doses, usually over 6 months.  Preventive Services /  Frequency Ages 19 to 39  Blood pressure check.** / Every 1 to 2 years.   Lipid and cholesterol check.** / Every 5 years beginning at age 20.   Clinical breast exam.** / Every 3 years for women in their 20s and 30s.   Pap test.** / Every 2 years from ages 21 through 29. Every 3 years starting at age 30 through age 65 or 70 with a history of 3 consecutive normal Pap tests.   HPV screening.** / Every 3 years from ages 30 through ages 65 to 70 with a history of 3 consecutive normal Pap tests.   Hepatitis C blood test.** / For any individual with known risks for hepatitis C.   Skin self-exam. / Monthly.   Influenza immunization.** / Every year.   Pneumococcal polysaccharide immunization.** / 1 to 2 doses if you smoke cigarettes or if you have certain chronic medical conditions.   Tetanus, diphtheria, pertussis (Tdap, Td) immunization. / A one-time dose of Tdap vaccine. After that, you need a Td booster dose every 10 years.   HPV immunization. / 3 doses over 6 months, if you are 26 and younger.   Measles, mumps, rubella (MMR) immunization. / You need at least 1 dose of MMR if you were born in 1957 or later. You may also need a second dose.   Meningococcal immunization. / 1 dose if you are age 19 to 21 and a first-year college student living in a residence hall, or have one of several medical conditions, you need to get vaccinated against meningococcal disease. You may also need additional booster doses.   Varicella immunization.** / Consult your caregiver.   Hepatitis A immunization.** / Consult your caregiver. 2 doses, 6 to 18 months   apart.   Hepatitis B immunization.** / Consult your caregiver. 3 doses usually over 6 months.  Ages 40 to 64  Blood pressure check.** / Every 1 to 2 years.   Lipid and cholesterol check.** / Every 5 years beginning at age 20.   Clinical breast exam.** / Every year after age 40.   Mammogram.** / Every year beginning at age 40 and continuing for as  long as you are in good health. Consult with your caregiver.   Pap test.** / Every 3 years starting at age 30 through age 65 or 70 with a history of 3 consecutive normal Pap tests.   HPV screening.** / Every 3 years from ages 30 through ages 65 to 70 with a history of 3 consecutive normal Pap tests.   Fecal occult blood test (FOBT) of stool. / Every year beginning at age 50 and continuing until age 75. You may not need to do this test if you get a colonoscopy every 10 years.   Flexible sigmoidoscopy or colonoscopy.** / Every 5 years for a flexible sigmoidoscopy or every 10 years for a colonoscopy beginning at age 50 and continuing until age 75.   Hepatitis C blood test.** / For all people born from 1945 through 1965 and any individual with known risks for hepatitis C.   Skin self-exam. / Monthly.   Influenza immunization.** / Every year.   Pneumococcal polysaccharide immunization.** / 1 to 2 doses if you smoke cigarettes or if you have certain chronic medical conditions.   Tetanus, diphtheria, pertussis (Tdap, Td) immunization.** / A one-time dose of Tdap vaccine. After that, you need a Td booster dose every 10 years.   Measles, mumps, rubella (MMR) immunization. / You need at least 1 dose of MMR if you were born in 1957 or later. You may also need a second dose.   Varicella immunization.** / Consult your caregiver.   Meningococcal immunization.** / Consult your caregiver.   Hepatitis A immunization.** / Consult your caregiver. 2 doses, 6 to 18 months apart.   Hepatitis B immunization.** / Consult your caregiver. 3 doses, usually over 6 months.  Ages 65 and over  Blood pressure check.** / Every 1 to 2 years.   Lipid and cholesterol check.** / Every 5 years beginning at age 20.   Clinical breast exam.** / Every year after age 40.   Mammogram.** / Every year beginning at age 40 and continuing for as long as you are in good health. Consult with your caregiver.   Pap test.** /  Every 3 years starting at age 30 through age 65 or 70 with a 3 consecutive normal Pap tests. Testing can be stopped between 65 and 70 with 3 consecutive normal Pap tests and no abnormal Pap or HPV tests in the past 10 years.   HPV screening.** / Every 3 years from ages 30 through ages 65 or 70 with a history of 3 consecutive normal Pap tests. Testing can be stopped between 65 and 70 with 3 consecutive normal Pap tests and no abnormal Pap or HPV tests in the past 10 years.   Fecal occult blood test (FOBT) of stool. / Every year beginning at age 50 and continuing until age 75. You may not need to do this test if you get a colonoscopy every 10 years.   Flexible sigmoidoscopy or colonoscopy.** / Every 5 years for a flexible sigmoidoscopy or every 10 years for a colonoscopy beginning at age 50 and continuing until age 75.   Hepatitis   C blood test.** / For all people born from 1945 through 1965 and any individual with known risks for hepatitis C.   Osteoporosis screening.** / A one-time screening for women ages 65 and over and women at risk for fractures or osteoporosis.   Skin self-exam. / Monthly.   Influenza immunization.** / Every year.   Pneumococcal polysaccharide immunization.** / 1 dose at age 65 (or older) if you have never been vaccinated.   Tetanus, diphtheria, pertussis (Tdap, Td) immunization. / A one-time dose of Tdap vaccine if you are over 65 and have contact with an infant, are a healthcare worker, or simply want to be protected from whooping cough. After that, you need a Td booster dose every 10 years.   Varicella immunization.** / Consult your caregiver.   Meningococcal immunization.** / Consult your caregiver.   Hepatitis A immunization.** / Consult your caregiver. 2 doses, 6 to 18 months apart.   Hepatitis B immunization.** / Check with your caregiver. 3 doses, usually over 6 months.  ** Family history and personal history of risk and conditions may change your caregiver's  recommendations. Document Released: 05/14/2001 Document Revised: 03/07/2011 Document Reviewed: 08/13/2010 [employer] Patient Information 2012 [employer]. 

## 2011-11-14 NOTE — Assessment & Plan Note (Signed)
Check labs  con't meds stable 

## 2011-11-14 NOTE — Assessment & Plan Note (Signed)
Check labs 

## 2011-11-14 NOTE — Progress Notes (Signed)
  Subjective:     Diane Hodges is a 56 y.o. female and is here for a comprehensive physical exam. The patient reports no problems.  History   Social History  . Marital Status: Married    Spouse Name: N/A    Number of Children: N/A  . Years of Education: N/A   Occupational History  . Not on file.   Social History Main Topics  . Smoking status: Never Smoker   . Smokeless tobacco: Never Used  . Alcohol Use: No  . Drug Use: No  . Sexually Active: Yes -- Female partner(s)   Other Topics Concern  . Not on file   Social History Narrative   Exercise-- walking 2days a week   Health Maintenance  Topic Date Due  . Tetanus/tdap  08/13/2011  . Influenza Vaccine  12/31/2011  . Mammogram  04/09/2013  . Pap Smear  04/08/2014  . Colonoscopy  12/17/2015    The following portions of the patient's history were reviewed and updated as appropriate: allergies, current medications, past family history, past medical history, past social history, past surgical history and problem list.  Review of Systems Review of Systems  Constitutional: Negative for activity change, appetite change and fatigue.  HENT: Negative for hearing loss, congestion, tinnitus and ear discharge.  dentist q43m Eyes: Negative for visual disturbance (see optho q1y -- vision corrected to 20/20 with glasses).  Respiratory: Negative for cough, chest tightness and shortness of breath.   Cardiovascular: Negative for chest pain, palpitations and leg swelling.  Gastrointestinal: Negative for abdominal pain, diarrhea, constipation and abdominal distention.  Genitourinary: Negative for urgency, frequency, decreased urine volume and difficulty urinating.  Musculoskeletal: Negative for back pain, arthralgias and gait problem.  Skin: Negative for color change, pallor and rash.  Neurological: Negative for dizziness, light-headedness, numbness and headaches.  Hematological: Negative for adenopathy. Does not bruise/bleed easily.    Psychiatric/Behavioral: Negative for suicidal ideas, confusion, sleep disturbance, self-injury, dysphoric mood, decreased concentration and agitation.       Objective:    BP 124/76  Pulse 74  Temp 98.4 F (36.9 C) (Oral)  Ht 5' 5.25" (1.657 m)  Wt 207 lb 12.8 oz (94.257 kg)  BMI 34.32 kg/m2  SpO2 96% General appearance: alert, cooperative, appears stated age and no distress Head: Normocephalic, without obvious abnormality, atraumatic Eyes: conjunctivae/corneas clear. PERRL, EOM&#39;s intact. Fundi benign. Ears: normal TM's and external ear canals both ears Nose: Nares normal. Septum midline. Mucosa normal. No drainage or sinus tenderness. Throat: lips, mucosa, and tongue normal; teeth and gums normal Neck: no adenopathy, no carotid bruit, no JVD, supple, symmetrical, trachea midline and thyroid not enlarged, symmetric, no tenderness/mass/nodules Back: symmetric, no curvature. ROM normal. No CVA tenderness. Lungs: clear to auscultation bilaterally Breasts: gyn Heart: regular rate and rhythm, S1, S2 normal, no murmur, click, rub or gallop Abdomen: soft, non-tender; bowel sounds normal; no masses,  no organomegaly Pelvic: deferred--gyn Extremities: extremities normal, atraumatic, no cyanosis or edema--- pain in R heel on weekends when she walks a lot Pulses: 2+ and symmetric Skin: Skin color, texture, turgor normal. No rashes or lesions Lymph nodes: Cervical, supraclavicular, and axillary nodes normal. Neurologic: Alert and oriented X 3, normal strength and tone. Normal symmetric reflexes. Normal coordination and gait psych---  no depression, anxiety    Assessment:    Healthy female exam.      Plan:    ghm utd Check labs See After Visit Summary for Counseling Recommendations

## 2011-11-16 LAB — URINE CULTURE: Colony Count: NO GROWTH

## 2011-11-29 ENCOUNTER — Telehealth: Payer: Self-pay | Admitting: Family Medicine

## 2011-11-29 DIAGNOSIS — I10 Essential (primary) hypertension: Secondary | ICD-10-CM

## 2011-11-29 MED ORDER — NEBIVOLOL HCL 10 MG PO TABS: 10.0000 mg | ORAL_TABLET | Freq: Every day | ORAL | Status: DC

## 2011-11-29 NOTE — Telephone Encounter (Signed)
Refill: Bystolic 10mg  tablet. Take 1 tablet by mouth daily. Qty 90. Last fill 07-23-11

## 2012-01-20 ENCOUNTER — Telehealth: Payer: Self-pay

## 2012-01-20 DIAGNOSIS — E039 Hypothyroidism, unspecified: Secondary | ICD-10-CM

## 2012-01-20 MED ORDER — FENOFIBRATE MICRONIZED 130 MG PO CAPS: 130.0000 mg | ORAL_CAPSULE | Freq: Every day | ORAL | Status: DC

## 2012-01-20 MED ORDER — LEVOTHYROXINE SODIUM 75 MCG PO TABS: 75.0000 ug | ORAL_TABLET | Freq: Every day | ORAL | Status: DC

## 2012-01-20 NOTE — Telephone Encounter (Signed)
Rx sent pt aware.   MW  

## 2012-04-20 ENCOUNTER — Other Ambulatory Visit (INDEPENDENT_AMBULATORY_CARE_PROVIDER_SITE_OTHER): Payer: Managed Care, Other (non HMO)

## 2012-04-20 DIAGNOSIS — R7989 Other specified abnormal findings of blood chemistry: Secondary | ICD-10-CM

## 2012-04-20 DIAGNOSIS — E785 Hyperlipidemia, unspecified: Secondary | ICD-10-CM

## 2012-04-20 LAB — LIPID PANEL
Cholesterol: 142 mg/dL (ref 0–200)
HDL: 36.8 mg/dL — ABNORMAL LOW (ref >39.00)
LDL Cholesterol: 80 mg/dL (ref 0–99)
Triglycerides: 125 mg/dL (ref 0.0–149.0)

## 2012-04-20 LAB — BASIC METABOLIC PANEL
BUN: 15 mg/dL (ref 6–23)
Calcium: 9.4 mg/dL (ref 8.4–10.5)
Chloride: 102 mEq/L (ref 96–112)
Creatinine, Ser: 0.9 mg/dL (ref 0.4–1.2)
GFR: 68.32 mL/min (ref >60.00)

## 2012-04-20 LAB — HEPATIC FUNCTION PANEL
ALT: 19 U/L (ref 0–35)
Total Bilirubin: 0.3 mg/dL (ref 0.3–1.2)

## 2012-04-24 ENCOUNTER — Encounter: Payer: Self-pay | Admitting: Family Medicine

## 2012-04-24 ENCOUNTER — Ambulatory Visit (INDEPENDENT_AMBULATORY_CARE_PROVIDER_SITE_OTHER): Payer: Managed Care, Other (non HMO) | Admitting: Family Medicine

## 2012-04-24 VITALS — BP 122/70 | HR 74 | Temp 98.1°F | Wt 209.0 lb

## 2012-04-24 DIAGNOSIS — R739 Hyperglycemia, unspecified: Secondary | ICD-10-CM

## 2012-04-24 DIAGNOSIS — E785 Hyperlipidemia, unspecified: Secondary | ICD-10-CM

## 2012-04-24 DIAGNOSIS — E039 Hypothyroidism, unspecified: Secondary | ICD-10-CM

## 2012-04-24 DIAGNOSIS — E119 Type 2 diabetes mellitus without complications: Secondary | ICD-10-CM

## 2012-04-24 MED ORDER — GLUCOSE BLOOD VI STRP: ORAL_STRIP | Status: AC

## 2012-04-24 MED ORDER — LEVOTHYROXINE SODIUM 75 MCG PO TABS: 75.0000 ug | ORAL_TABLET | Freq: Every day | ORAL | Status: DC

## 2012-04-24 MED ORDER — ONETOUCH DELICA LANCETS FINE MISC: 1.0000 | Freq: Every day | Status: AC

## 2012-04-24 NOTE — Patient Instructions (Addendum)

## 2012-04-24 NOTE — Progress Notes (Signed)
  Subjective:    Patient here for follow-up of elevated blood pressure.  She is not exercising and is adherent to a low-salt diet.  Blood pressure is well controlled at home. Cardiac symptoms: none. Patient denies: chest pain, chest pressure/discomfort, claudication, dyspnea, exertional chest pressure/discomfort, fatigue, irregular heart beat, lower extremity edema, near-syncope, orthopnea, palpitations, paroxysmal nocturnal dyspnea, syncope and tachypnea. Cardiovascular risk factors: hypertension, obesity (BMI >= 30 kg/m2) and sedentary lifestyle. Use of agents associated with hypertension: none. History of target organ damage: none.  The following portions of the patient's history were reviewed and updated as appropriate: allergies, current medications, past family history, past medical history, past social history, past surgical history and problem list.  Review of Systems Pertinent items are noted in HPI.     Objective:    BP 122/70  Pulse 74  Temp 98.1 F (36.7 C) (Oral)  Wt 209 lb (94.802 kg)  SpO2 96% General appearance: alert, cooperative, appears stated age and no distress Lungs: clear to auscultation bilaterally Heart: S1, S2 normal    Assessment:    Hypertension, normal blood pressure . Evidence of target organ damage: none.   hyperglycemia--- check glucose 2-3 x a week,  Pt given diet info                                Recheck 3 months Plan:    Medication: no change. Dietary sodium restriction. Regular aerobic exercise. Check blood pressures 2-3 times weekly and record. Follow up: 3 months and as needed.

## 2012-05-06 ENCOUNTER — Telehealth: Payer: Self-pay | Admitting: Family Medicine

## 2012-05-06 NOTE — Telephone Encounter (Signed)
Refill: Fenofibrate 130 mg capsule. Take 1 capsule by mouth daily before breakfast. Qty 90. Last fill 01-20-12

## 2012-05-07 MED ORDER — FENOFIBRATE MICRONIZED 130 MG PO CAPS: 130.0000 mg | ORAL_CAPSULE | Freq: Every day | ORAL | Status: DC

## 2012-06-12 ENCOUNTER — Telehealth: Payer: Self-pay | Admitting: Family Medicine

## 2012-06-12 MED ORDER — NEBIVOLOL HCL 10 MG PO TABS: 10.0000 mg | ORAL_TABLET | Freq: Every day | ORAL | Status: DC

## 2012-06-12 NOTE — Telephone Encounter (Signed)
refill Bystolic 10MG  Tablet #90 wt/1-refill Take 1 tablet by mouth daily last fill 11.25.13

## 2012-08-13 ENCOUNTER — Other Ambulatory Visit (INDEPENDENT_AMBULATORY_CARE_PROVIDER_SITE_OTHER): Payer: Managed Care, Other (non HMO)

## 2012-08-13 DIAGNOSIS — R7309 Other abnormal glucose: Secondary | ICD-10-CM

## 2012-08-13 DIAGNOSIS — R739 Hyperglycemia, unspecified: Secondary | ICD-10-CM

## 2012-08-13 DIAGNOSIS — E785 Hyperlipidemia, unspecified: Secondary | ICD-10-CM

## 2012-08-13 LAB — HEPATIC FUNCTION PANEL
ALT: 19 U/L (ref 0–35)
AST: 18 U/L (ref 0–37)
Albumin: 4.3 g/dL (ref 3.5–5.2)

## 2012-08-13 LAB — LIPID PANEL
HDL: 37.7 mg/dL — ABNORMAL LOW (ref >39.00)
Total CHOL/HDL Ratio: 4
Triglycerides: 117 mg/dL (ref 0.0–149.0)
VLDL: 23.4 mg/dL (ref 0.0–40.0)

## 2012-08-13 LAB — MICROALBUMIN / CREATININE URINE RATIO
Creatinine,U: 112.8 mg/dL
Microalb Creat Ratio: 0.8 mg/g (ref 0.0–30.0)

## 2012-08-13 LAB — BASIC METABOLIC PANEL
Calcium: 9 mg/dL (ref 8.4–10.5)
GFR: 81.71 mL/min (ref >60.00)
Potassium: 3.6 mEq/L (ref 3.5–5.1)
Sodium: 141 mEq/L (ref 135–145)

## 2012-10-20 ENCOUNTER — Other Ambulatory Visit: Payer: Self-pay | Admitting: Family Medicine

## 2012-11-11 ENCOUNTER — Other Ambulatory Visit: Payer: Self-pay | Admitting: *Deleted

## 2012-11-11 MED ORDER — FENOFIBRATE MICRONIZED 130 MG PO CAPS: 130.0000 mg | ORAL_CAPSULE | Freq: Every day | ORAL | Status: DC

## 2012-11-11 NOTE — Telephone Encounter (Signed)
Rx was refilled for fenofibrate 130 mg.  Ag cma

## 2013-01-14 ENCOUNTER — Other Ambulatory Visit: Payer: Self-pay | Admitting: Family Medicine

## 2013-01-16 ENCOUNTER — Other Ambulatory Visit: Payer: Self-pay | Admitting: Family Medicine

## 2013-02-19 ENCOUNTER — Ambulatory Visit (INDEPENDENT_AMBULATORY_CARE_PROVIDER_SITE_OTHER): Payer: Managed Care, Other (non HMO) | Admitting: Family Medicine

## 2013-02-19 ENCOUNTER — Encounter: Payer: Self-pay | Admitting: Family Medicine

## 2013-02-19 VITALS — BP 122/82 | HR 71 | Temp 98.4°F | Wt 205.0 lb

## 2013-02-19 DIAGNOSIS — R7309 Other abnormal glucose: Secondary | ICD-10-CM

## 2013-02-19 DIAGNOSIS — R739 Hyperglycemia, unspecified: Secondary | ICD-10-CM

## 2013-02-19 DIAGNOSIS — F411 Generalized anxiety disorder: Secondary | ICD-10-CM

## 2013-02-19 DIAGNOSIS — E785 Hyperlipidemia, unspecified: Secondary | ICD-10-CM

## 2013-02-19 DIAGNOSIS — F419 Anxiety disorder, unspecified: Secondary | ICD-10-CM

## 2013-02-19 DIAGNOSIS — E669 Obesity, unspecified: Secondary | ICD-10-CM | POA: Insufficient documentation

## 2013-02-19 DIAGNOSIS — E039 Hypothyroidism, unspecified: Secondary | ICD-10-CM

## 2013-02-19 DIAGNOSIS — E781 Pure hyperglyceridemia: Secondary | ICD-10-CM

## 2013-02-19 DIAGNOSIS — N390 Urinary tract infection, site not specified: Secondary | ICD-10-CM

## 2013-02-19 DIAGNOSIS — I10 Essential (primary) hypertension: Secondary | ICD-10-CM

## 2013-02-19 LAB — LIPID PANEL
Cholesterol: 131 mg/dL (ref 0–200)
LDL Cholesterol: 66 mg/dL (ref 0–99)
Total CHOL/HDL Ratio: 3
Triglycerides: 118 mg/dL (ref 0.0–149.0)
VLDL: 23.6 mg/dL (ref 0.0–40.0)

## 2013-02-19 LAB — POCT URINALYSIS DIPSTICK
Bilirubin, UA: NEGATIVE
Glucose, UA: NEGATIVE
Leukocytes, UA: NEGATIVE
Nitrite, UA: NEGATIVE
Spec Grav, UA: 1.015
Urobilinogen, UA: 0.2

## 2013-02-19 LAB — HEPATIC FUNCTION PANEL
ALT: 22 U/L (ref 0–35)
Bilirubin, Direct: 0 mg/dL (ref 0.0–0.3)
Total Bilirubin: 0.5 mg/dL (ref 0.3–1.2)

## 2013-02-19 LAB — MICROALBUMIN / CREATININE URINE RATIO
Creatinine,U: 114.5 mg/dL
Microalb, Ur: 0.4 mg/dL (ref 0.0–1.9)

## 2013-02-19 LAB — CBC WITH DIFFERENTIAL/PLATELET
Basophils Absolute: 0 10*3/uL (ref 0.0–0.1)
Eosinophils Absolute: 0.1 10*3/uL (ref 0.0–0.7)
Eosinophils Relative: 2.2 % (ref 0.0–5.0)
Lymphocytes Relative: 41.2 % (ref 12.0–46.0)
Lymphs Abs: 2.3 10*3/uL (ref 0.7–4.0)
Monocytes Relative: 6.7 % (ref 3.0–12.0)
Neutrophils Relative %: 49.4 % (ref 43.0–77.0)
Platelets: 204 10*3/uL (ref 150.0–400.0)
RDW: 12.8 % (ref 11.5–14.6)
WBC: 5.5 10*3/uL (ref 4.5–10.5)

## 2013-02-19 LAB — BASIC METABOLIC PANEL
BUN: 10 mg/dL (ref 6–23)
Calcium: 9.1 mg/dL (ref 8.4–10.5)
Creatinine, Ser: 0.8 mg/dL (ref 0.4–1.2)
GFR: 76.93 mL/min (ref >60.00)
Glucose, Bld: 111 mg/dL — ABNORMAL HIGH (ref 70–99)
Potassium: 3.9 mEq/L (ref 3.5–5.1)

## 2013-02-19 LAB — TSH: TSH: 3.83 u[IU]/mL (ref 0.35–5.50)

## 2013-02-19 LAB — HEMOGLOBIN A1C: Hgb A1c MFr Bld: 6.5 % (ref 4.6–6.5)

## 2013-02-19 MED ORDER — NEBIVOLOL HCL 10 MG PO TABS: ORAL_TABLET | ORAL | Status: DC

## 2013-02-19 NOTE — Progress Notes (Signed)
  Subjective:    Patient ID: Diane Hodges, female    DOB: 1955/04/02, 57 y.o.   MRN: [id number]  HPI  HPI Pt here for f/u and c/o increased stress at work and inc anxiety.  Pt does not want to go on anything at this time and does not think counseling will help. She also needs her thyroid checked and refills.  HYPERTENSION  Blood pressure range-not checking  Chest pain- no      Dyspnea- no Lightheadedness- no   Edema- no Other side effects - no   Medication compliance: good Low salt diet- yes  DIABETES  Blood Sugar ranges-not checking  Polyuria- no New Visual problems- no Hypoglycemic symptoms- no Other side effects-no Medication compliance - not on any Last eye exam- 2013   HYPERLIPIDEMIA  Medication compliance- good RUQ pain- no  Muscle aches- no Other side effects-no  ROS See HPI above   PMH Smoking Status noted       Review of Systems As above     Objective:   Physical Exam BP 122/82  Pulse 71  Temp(Src) 98.4 F (36.9 C) (Oral)  Wt 205 lb (92.987 kg)  SpO2 96% General appearance: alert, cooperative, appears stated age and no distress Neck: no adenopathy, no carotid bruit, no JVD, supple, symmetrical, trachea midline and thyroid not enlarged, symmetric, no tenderness/mass/nodules Lungs: clear to auscultation bilaterally Heart: S1, S2 normal Extremities: extremities normal, atraumatic, no cyanosis or edema Neurologic: Alert and oriented X 3, normal strength and tone. Normal symmetric reflexes. Normal coordination and gait Psych- no depression, no anxiety       Assessment & Plan:

## 2013-02-19 NOTE — Patient Instructions (Addendum)
rto cpe Hypertriglyceridemia  Diet for High blood levels of Triglycerides Most fats in food are triglycerides. Triglycerides in your blood are stored as fat in your body. High levels of triglycerides in your blood may put you at a greater risk for heart disease and stroke.  Normal triglyceride levels are less than 150 mg/dL. Borderline high levels are 150-199 mg/dl. High levels are 200 - 499 mg/dL, and very high triglyceride levels are greater than 500 mg/dL. The decision to treat high triglycerides is generally based on the level. For people with borderline or high triglyceride levels, treatment includes weight loss and exercise. Drugs are recommended for people with very high triglyceride levels. Many people who need treatment for high triglyceride levels have metabolic syndrome. This syndrome is a collection of disorders that often include: insulin resistance, high blood pressure, blood clotting problems, high cholesterol and triglycerides. TESTING PROCEDURE FOR TRIGLYCERIDES  You should not eat 4 hours before getting your triglycerides measured. The normal range of triglycerides is between 10 and 250 milligrams per deciliter (mg/dl). Some people may have extreme levels (1000 or above), but your triglyceride level may be too high if it is above 150 mg/dl, depending on what other risk factors you have for heart disease.  People with high blood triglycerides may also have high blood cholesterol levels. If you have high blood cholesterol as well as high blood triglycerides, your risk for heart disease is probably greater than if you only had high triglycerides. High blood cholesterol is one of the main risk factors for heart disease. CHANGING YOUR DIET  Your weight can affect your blood triglyceride level. If you are more than 20% above your ideal body weight, you may be able to lower your blood triglycerides by losing weight. Eating less and exercising regularly is the best way to combat this. Fat  provides more calories than any other food. The best way to lose weight is to eat less fat. Only 30% of your total calories should come from fat. Less than 7% of your diet should come from saturated fat. A diet low in fat and saturated fat is the same as a diet to decrease blood cholesterol. By eating a diet lower in fat, you may lose weight, lower your blood cholesterol, and lower your blood triglyceride level.  Eating a diet low in fat, especially saturated fat, may also help you lower your blood triglyceride level. Ask your dietitian to help you figure how much fat you can eat based on the number of calories your caregiver has prescribed for you.  Exercise, in addition to helping with weight loss may also help lower triglyceride levels.   Alcohol can increase blood triglycerides. You may need to stop drinking alcoholic beverages.  Too much carbohydrate in your diet may also increase your blood triglycerides. Some complex carbohydrates are necessary in your diet. These may include bread, rice, potatoes, other starchy vegetables and cereals.  Reduce "simple" carbohydrates. These may include pure sugars, candy, honey, and jelly without losing other nutrients. If you have the kind of high blood triglycerides that is affected by the amount of carbohydrates in your diet, you will need to eat less sugar and less high-sugar foods. Your caregiver can help you with this.  Adding 2-4 grams of fish oil (EPA+ DHA) may also help lower triglycerides. Speak with your caregiver before adding any supplements to your regimen. Following the Diet  Maintain your ideal weight. Your caregivers can help you with a diet. Generally, eating less food and  getting more exercise will help you lose weight. Joining a weight control group may also help. Ask your caregivers for a good weight control group in your area.  Eat low-fat foods instead of high-fat foods. This can help you lose weight too.  These foods are lower in fat. Eat  MORE of these:   Dried beans, peas, and lentils.  Egg whites.  Low-fat cottage cheese.  Fish.  Lean cuts of meat, such as round, sirloin, rump, and flank (cut extra fat off meat you fix).  Whole grain breads, cereals and pasta.  Skim and nonfat dry milk.  Low-fat yogurt.  Poultry without the skin.  Cheese made with skim or part-skim milk, such as mozzarella, parmesan, farmers', ricotta, or pot cheese. These are higher fat foods. Eat LESS of these:   Whole milk and foods made from whole milk, such as American, blue, cheddar, monterey jack, and swiss cheese  High-fat meats, such as luncheon meats, sausages, knockwurst, bratwurst, hot dogs, ribs, corned beef, ground pork, and regular ground beef.  Fried foods. Limit saturated fats in your diet. Substituting unsaturated fat for saturated fat may decrease your blood triglyceride level. You will need to read package labels to know which products contain saturated fats.  These foods are high in saturated fat. Eat LESS of these:   Fried pork skins.  Whole milk.  Skin and fat from poultry.  Palm oil.  Butter.  Shortening.  Cream cheese.  Tomasa Blase.  Margarines and baked goods made from listed oils.  Vegetable shortenings.  Chitterlings.  Fat from meats.  Coconut oil.  Palm kernel oil.  Lard.  Cream.  Sour cream.  Fatback.  Coffee whiteners and non-dairy creamers made with these oils.  Cheese made from whole milk. Use unsaturated fats (both polyunsaturated and monounsaturated) moderately. Remember, even though unsaturated fats are better than saturated fats; you still want a diet low in total fat.  These foods are high in unsaturated fat:   Canola oil.  Sunflower oil.  Mayonnaise.  Almonds.  Peanuts.  Pine nuts.  Margarines made with these oils.  Safflower oil.  Olive oil.  Avocados.  Cashews.  Peanut butter.  Sunflower seeds.  Soybean oil.  Peanut  oil.  Olives.  Pecans.  Walnuts.  Pumpkin seeds. Avoid sugar and other high-sugar foods. This will decrease carbohydrates without decreasing other nutrients. Sugar in your food goes rapidly to your blood. When there is excess sugar in your blood, your liver may use it to make more triglycerides. Sugar also contains calories without other important nutrients.  Eat LESS of these:   Sugar, brown sugar, powdered sugar, jam, jelly, preserves, honey, syrup, molasses, pies, candy, cakes, cookies, frosting, pastries, colas, soft drinks, punches, fruit drinks, and regular gelatin.  Avoid alcohol. Alcohol, even more than sugar, may increase blood triglycerides. In addition, alcohol is high in calories and low in nutrients. Ask for sparkling water, or a diet soft drink instead of an alcoholic beverage. Suggestions for planning and preparing meals   Bake, broil, grill or roast meats instead of frying.  Remove fat from meats and skin from poultry before cooking.  Add spices, herbs, lemon juice or vinegar to vegetables instead of salt, rich sauces or gravies.  Use a non-stick skillet without fat or use no-stick sprays.  Cool and refrigerate stews and broth. Then remove the hardened fat floating on the surface before serving.  Refrigerate meat drippings and skim off fat to make low-fat gravies.  Serve more fish.  Use  less butter, margarine and other high-fat spreads on bread or vegetables.  Use skim or reconstituted non-fat dry milk for cooking.  Cook with low-fat cheeses.  Substitute low-fat yogurt or cottage cheese for all or part of the sour cream in recipes for sauces, dips or congealed salads.  Use half yogurt/half mayonnaise in salad recipes.  Substitute evaporated skim milk for cream. Evaporated skim milk or reconstituted non-fat dry milk can be whipped and substituted for whipped cream in certain recipes.  Choose fresh fruits for dessert instead of high-fat foods such as pies or  cakes. Fruits are naturally low in fat. When Dining Out   Order low-fat appetizers such as fruit or vegetable juice, pasta with vegetables or tomato sauce.  Select clear, rather than cream soups.  Ask that dressings and gravies be served on the side. Then use less of them.  Order foods that are baked, broiled, poached, steamed, stir-fried, or roasted.  Ask for margarine instead of butter, and use only a small amount.  Drink sparkling water, unsweetened tea or coffee, or diet soft drinks instead of alcohol or other sweet beverages. QUESTIONS AND ANSWERS ABOUT OTHER FATS IN THE BLOOD: SATURATED FAT, TRANS FAT, AND CHOLESTEROL What is trans fat? Trans fat is a type of fat that is formed when vegetable oil is hardened through a process called hydrogenation. This process helps makes foods more solid, gives them shape, and prolongs their shelf life. Trans fats are also called hydrogenated or partially hydrogenated oils.  What do saturated fat, trans fat, and cholesterol in foods have to do with heart disease? Saturated fat, trans fat, and cholesterol in the diet all raise the level of LDL "bad" cholesterol in the blood. The higher the LDL cholesterol, the greater the risk for coronary heart disease (CHD). Saturated fat and trans fat raise LDL similarly.  What foods contain saturated fat, trans fat, and cholesterol? High amounts of saturated fat are found in animal products, such as fatty cuts of meat, chicken skin, and full-fat dairy products like butter, whole milk, cream, and cheese, and in tropical vegetable oils such as palm, palm kernel, and coconut oil. Trans fat is found in some of the same foods as saturated fat, such as vegetable shortening, some margarines (especially hard or stick margarine), crackers, cookies, baked goods, fried foods, salad dressings, and other processed foods made with partially hydrogenated vegetable oils. Small amounts of trans fat also occur naturally in some animal  products, such as milk products, beef, and lamb. Foods high in cholesterol include liver, other organ meats, egg yolks, shrimp, and full-fat dairy products. How can I use the new food label to make heart-healthy food choices? Check the Nutrition Facts panel of the food label. Choose foods lower in saturated fat, trans fat, and cholesterol. For saturated fat and cholesterol, you can also use the Percent Daily Value (%DV): 5% DV or less is low, and 20% DV or more is high. (There is no %DV for trans fat.) Use the Nutrition Facts panel to choose foods low in saturated fat and cholesterol, and if the trans fat is not listed, read the ingredients and limit products that list shortening or hydrogenated or partially hydrogenated vegetable oil, which tend to be high in trans fat. POINTS TO REMEMBER:   Discuss your risk for heart disease with your caregivers, and take steps to reduce risk factors.  Change your diet. Choose foods that are low in saturated fat, trans fat, and cholesterol.  Add exercise to your daily  routine if it is not already being done. Participate in physical activity of moderate intensity, like brisk walking, for at least 30 minutes on most, and preferably all days of the week. No time? Break the 30 minutes into three, 10-minute segments during the day.  Stop smoking. If you do smoke, contact your caregiver to discuss ways in which they can help you quit.  Do not use street drugs.  Maintain a normal weight.  Maintain a healthy blood pressure.  Keep up with your blood work for checking the fats in your blood as directed by your caregiver. Document Released: 01/04/2004 Document Revised: 09/17/2011 Document Reviewed: 08/01/2008 [facility] Patient Information 2014 [place], [place].

## 2013-02-19 NOTE — Progress Notes (Signed)
Pre visit review using our clinic review tool, if applicable. No additional management support is needed unless otherwise documented below in the visit note. 

## 2013-02-20 DIAGNOSIS — F419 Anxiety disorder, unspecified: Secondary | ICD-10-CM | POA: Insufficient documentation

## 2013-02-20 NOTE — Assessment & Plan Note (Signed)
See meds and orders 

## 2013-02-20 NOTE — Assessment & Plan Note (Signed)
Check labs con't meds 

## 2013-02-20 NOTE — Assessment & Plan Note (Signed)
Check labs  con't meds stable 

## 2013-02-20 NOTE — Assessment & Plan Note (Addendum)
Check labs con't meds 

## 2013-02-22 LAB — URINE CULTURE

## 2013-04-01 HISTORY — PX: CATARACT EXTRACTION: SUR2

## 2013-04-16 ENCOUNTER — Other Ambulatory Visit: Payer: Self-pay | Admitting: Family Medicine

## 2013-05-12 ENCOUNTER — Telehealth: Payer: Self-pay

## 2013-05-12 NOTE — Telephone Encounter (Signed)
Left a message for call back. Identifiable.  PAP- 04/09/11 [facility]- 12/16/05 [facility]- Flu Tdap- 11/14/11

## 2013-05-13 ENCOUNTER — Other Ambulatory Visit: Payer: Self-pay | Admitting: Family Medicine

## 2013-05-13 ENCOUNTER — Encounter: Payer: Self-pay | Admitting: Family Medicine

## 2013-05-13 ENCOUNTER — Ambulatory Visit (INDEPENDENT_AMBULATORY_CARE_PROVIDER_SITE_OTHER): Payer: Managed Care, Other (non HMO) | Admitting: Family Medicine

## 2013-05-13 VITALS — BP 140/81 | HR 65 | Temp 98.6°F | Ht 65.5 in | Wt 205.8 lb

## 2013-05-13 DIAGNOSIS — Z0000 Encounter for general adult medical examination without abnormal findings: Secondary | ICD-10-CM

## 2013-05-13 DIAGNOSIS — E785 Hyperlipidemia, unspecified: Secondary | ICD-10-CM

## 2013-05-13 DIAGNOSIS — E039 Hypothyroidism, unspecified: Secondary | ICD-10-CM

## 2013-05-13 DIAGNOSIS — R7309 Other abnormal glucose: Secondary | ICD-10-CM

## 2013-05-13 DIAGNOSIS — R739 Hyperglycemia, unspecified: Secondary | ICD-10-CM

## 2013-05-13 DIAGNOSIS — E781 Pure hyperglyceridemia: Secondary | ICD-10-CM

## 2013-05-13 DIAGNOSIS — I10 Essential (primary) hypertension: Secondary | ICD-10-CM

## 2013-05-13 MED ORDER — FENOFIBRATE MICRONIZED 130 MG PO CAPS: 130.0000 mg | ORAL_CAPSULE | Freq: Every day | ORAL | Status: DC

## 2013-05-13 NOTE — Assessment & Plan Note (Signed)
Stable con't meds 

## 2013-05-13 NOTE — Patient Instructions (Signed)

## 2013-05-13 NOTE — Progress Notes (Signed)
Subjective:     Diane Hodges is a 58 y.o. female and is here for a comprehensive physical exam. The patient reports no problems.  History   Social History  . Marital Status: Married    Spouse Name: N/A    Number of Children: N/A  . Years of Education: N/A   Occupational History  . Not on file.   Social History Main Topics  . Smoking status: Never Smoker   . Smokeless tobacco: Never Used  . Alcohol Use: No  . Drug Use: No  . Sexual Activity: Yes    Partners: Male   Other Topics Concern  . Not on file   Social History Narrative   Exercise-- none   Health Maintenance  Topic Date Due  . Mammogram  04/09/2013  . Influenza Vaccine  02/19/2014 (Originally 10/30/2012)  . Pap Smear  04/08/2014  . Colonoscopy  12/17/2015  . Tetanus/tdap  11/13/2021    The following portions of the patient's history were reviewed and updated as appropriate:  She  has a past medical history of Thyroid disease and Hypertension. She  does not have any pertinent problems on file. She  has past surgical history that includes Oophorectomy (1980). Her family history includes Hypertension in her father and mother; Thyroid disease in an other family member. She  reports that she has never smoked. She has never used smokeless tobacco. She reports that she does not drink alcohol or use illicit drugs. She has a current medication list which includes the following prescription(s): fenofibrate micronized, glucose blood, levothyroxine, nebivolol, and onetouch delica lancets fine. Current Outpatient Prescriptions on File Prior to Visit  Medication Sig Dispense Refill  . glucose blood test strip Onetouch verio test strips. Check blood sugar once a day  100 each  0  . levothyroxine (SYNTHROID, LEVOTHROID) 75 MCG tablet Take 1 tablet (75 mcg total) by mouth daily before breakfast.  90 tablet  3  . nebivolol (BYSTOLIC) 10 MG tablet Take 1 tablet (10 mg total) by mouth daily.  90 tablet  1  . ONETOUCH DELICA  LANCETS FINE MISC 1 Device by Does not apply route daily.  100 each  0   No current facility-administered medications on file prior to visit.   She is allergic to azithromycin..  Review of Systems Review of Systems  Constitutional: Negative for activity change, appetite change and fatigue.  HENT: Negative for hearing loss, congestion, tinnitus and ear discharge.  dentist q57m Eyes: Negative for visual disturbance (see optho q1y -- vision corrected to 20/20 with glasses).  Respiratory: Negative for cough, chest tightness and shortness of breath.   Cardiovascular: Negative for chest pain, palpitations and leg swelling.  Gastrointestinal: Negative for abdominal pain, diarrhea, constipation and abdominal distention.  Genitourinary: Negative for urgency, frequency, decreased urine volume and difficulty urinating.  Musculoskeletal: Negative for back pain, arthralgias and gait problem.  Skin: Negative for color change, pallor and rash.  Neurological: Negative for dizziness, light-headedness, numbness and headaches.  Hematological: Negative for adenopathy. Does not bruise/bleed easily.  Psychiatric/Behavioral: Negative for suicidal ideas, confusion, sleep disturbance, self-injury, dysphoric mood, decreased concentration and agitation.       Objective:    BP 140/81  Pulse 65  Temp(Src) 98.6 F (37 C) (Oral)  Ht 5' 5.5" (1.664 m)  Wt 205 lb 12.8 oz (93.35 kg)  BMI 33.71 kg/m2  SpO2 97% General appearance: alert, cooperative, appears stated age and no distress Head: Normocephalic, without obvious abnormality, atraumatic Eyes: conjunctivae/corneas clear. PERRL, EOM's  intact. Fundi benign. Ears: normal TM's and external ear canals both ears Nose: Nares normal. Septum midline. Mucosa normal. No drainage or sinus tenderness. Throat: lips, mucosa, and tongue normal; teeth and gums normal Neck: no adenopathy, no carotid bruit, no JVD, supple, symmetrical, trachea midline and thyroid not  enlarged, symmetric, no tenderness/mass/nodules Back: symmetric, no curvature. ROM normal. No CVA tenderness. Lungs: clear to auscultation bilaterally Breasts: gyn Heart: S1, S2 normal Abdomen: soft, non-tender; bowel sounds normal; no masses,  no organomegaly Pelvic: deferred--gyn Extremities: extremities normal, atraumatic, no cyanosis or edema Pulses: 2+ and symmetric Skin: Skin color, texture, turgor normal. No rashes or lesions Lymph nodes: Cervical, supraclavicular, and axillary nodes normal. Neurologic: Alert and oriented X 3, normal strength and tone. Normal symmetric reflexes. Normal coordination and gait Psych---no depression, no anxiety      Assessment:    Healthy female exam.      Plan:    ghm utd Check labs See After Visit Summary for Counseling Recommendations

## 2013-05-13 NOTE — Assessment & Plan Note (Signed)
Lab Results  Component Value Date   TSH 3.83 02/19/2013

## 2013-05-13 NOTE — Assessment & Plan Note (Addendum)
Lab Results  Component Value Date   CHOL 131 02/19/2013   HDL 41.80 02/19/2013   LDLCALC 66 02/19/2013   LDLDIRECT 98.1 02/17/2009   TRIG 118.0 02/19/2013   CHOLHDL 3 02/19/2013  con't meds

## 2013-05-14 ENCOUNTER — Telehealth: Payer: Self-pay | Admitting: Family Medicine

## 2013-05-14 NOTE — Telephone Encounter (Signed)
Relevant patient education mailed to patient.  

## 2013-05-14 NOTE — Telephone Encounter (Signed)
Did you want this patient to take Antara 90 or the 130. She has a discount card but it is for the 90 her Rx on the system says 130. Please advise     KP

## 2013-05-20 ENCOUNTER — Telehealth: Payer: Self-pay

## 2013-05-20 MED ORDER — FENOFIBRATE MICRONIZED 90 MG PO CAPS: 1.0000 | ORAL_CAPSULE | Freq: Every day | ORAL | Status: DC

## 2013-05-20 NOTE — Telephone Encounter (Signed)
Letter from the pharmacy advised Rx sent for Antara 130 but if patient is to use coupon we will need to send in the 90 mg. Ok per [employer].    KP

## 2013-10-23 ENCOUNTER — Other Ambulatory Visit: Payer: Self-pay | Admitting: Family Medicine

## 2013-11-24 ENCOUNTER — Other Ambulatory Visit: Payer: Self-pay | Admitting: Family Medicine

## 2013-11-25 NOTE — Telephone Encounter (Signed)
Please schedule a lab apt. Future orders are in the system.     KP

## 2013-11-25 NOTE — Telephone Encounter (Signed)
Diane Hodges [phone] okay to leave message [facility] called to check on refills for fenofibrate micronized (ANTARA) 130 MG capsule and Fenofibrate Micronized 90 MG CAPS She was wondering if she needed to come in for labs first before getting refills

## 2014-02-27 ENCOUNTER — Other Ambulatory Visit: Payer: Self-pay | Admitting: Family Medicine

## 2014-02-28 NOTE — Telephone Encounter (Signed)
Left detailed message informing patient to schedule a fasting follow up.

## 2014-02-28 NOTE — Telephone Encounter (Signed)
Please scheduled this patient a follow up, she needs to be fasting.       KP

## 2014-03-04 ENCOUNTER — Telehealth: Payer: Self-pay

## 2014-03-04 MED ORDER — ANTARA 90 MG PO CAPS: 1.0000 | ORAL_CAPSULE | Freq: Every day | ORAL | Status: DC

## 2014-03-04 NOTE — Telephone Encounter (Signed)
Diane Hodges [phone] [facility] called to check on prescription for ANTARA 90 MG CAPS, she needs this to be wrote for 90 days supply because the Insurance will not pay for anything less. The approval went in on the 30 for 30 day but she needs 90 day

## 2014-03-07 ENCOUNTER — Telehealth: Payer: Self-pay

## 2014-03-07 NOTE — Telephone Encounter (Signed)
Prescription filled.

## 2014-03-07 NOTE — Telephone Encounter (Signed)
Pharmacy:  [facility]  Request: Antara 90 mg capsule---Take one capsule by mouth once daily   Last OV:  05/13/13

## 2014-03-10 ENCOUNTER — Other Ambulatory Visit (INDEPENDENT_AMBULATORY_CARE_PROVIDER_SITE_OTHER): Payer: Managed Care, Other (non HMO)

## 2014-03-10 DIAGNOSIS — R739 Hyperglycemia, unspecified: Secondary | ICD-10-CM

## 2014-03-10 DIAGNOSIS — E781 Pure hyperglyceridemia: Secondary | ICD-10-CM

## 2014-03-10 DIAGNOSIS — Z0000 Encounter for general adult medical examination without abnormal findings: Secondary | ICD-10-CM

## 2014-03-10 LAB — CBC WITH DIFFERENTIAL/PLATELET
BASOS ABS: 0 10*3/uL (ref 0.0–0.1)
Basophils Relative: 0.6 % (ref 0.0–3.0)
EOS ABS: 0.2 10*3/uL (ref 0.0–0.7)
Eosinophils Relative: 2.8 % (ref 0.0–5.0)
HEMATOCRIT: 40.3 % (ref 36.0–46.0)
HEMOGLOBIN: 13.6 g/dL (ref 12.0–15.0)
LYMPHS ABS: 2.6 10*3/uL (ref 0.7–4.0)
Lymphocytes Relative: 44.8 % (ref 12.0–46.0)
MCHC: 33.8 g/dL (ref 30.0–36.0)
MCV: 93.3 fl (ref 78.0–100.0)
MONOS PCT: 6.7 % (ref 3.0–12.0)
Monocytes Absolute: 0.4 10*3/uL (ref 0.1–1.0)
Neutro Abs: 2.6 10*3/uL (ref 1.4–7.7)
Neutrophils Relative %: 45.1 % (ref 43.0–77.0)
PLATELETS: 223 10*3/uL (ref 150.0–400.0)
RBC: 4.32 Mil/uL (ref 3.87–5.11)
RDW: 13.1 % (ref 11.5–15.5)
WBC: 5.9 10*3/uL (ref 4.0–10.5)

## 2014-03-10 LAB — LIPID PANEL
CHOL/HDL RATIO: 3
Cholesterol: 134 mg/dL (ref 0–200)
HDL: 41.1 mg/dL (ref >39.00)
LDL Cholesterol: 65 mg/dL (ref 0–99)
NONHDL: 92.9
Triglycerides: 138 mg/dL (ref 0.0–149.0)
VLDL: 27.6 mg/dL (ref 0.0–40.0)

## 2014-03-10 LAB — BASIC METABOLIC PANEL
BUN: 11 mg/dL (ref 6–23)
CO2: 27 meq/L (ref 19–32)
Calcium: 9.2 mg/dL (ref 8.4–10.5)
Chloride: 105 mEq/L (ref 96–112)
Creatinine, Ser: 0.7 mg/dL (ref 0.4–1.2)
GFR: 89.25 mL/min (ref >60.00)
GLUCOSE: 114 mg/dL — AB (ref 70–99)
POTASSIUM: 4.2 meq/L (ref 3.5–5.1)
Sodium: 139 mEq/L (ref 135–145)

## 2014-03-10 LAB — HEPATIC FUNCTION PANEL
ALBUMIN: 4.4 g/dL (ref 3.5–5.2)
ALK PHOS: 44 U/L (ref 39–117)
ALT: 23 U/L (ref 0–35)
AST: 20 U/L (ref 0–37)
Bilirubin, Direct: 0 mg/dL (ref 0.0–0.3)
Total Bilirubin: 0.6 mg/dL (ref 0.2–1.2)
Total Protein: 7 g/dL (ref 6.0–8.3)

## 2014-03-10 LAB — HEMOGLOBIN A1C: Hgb A1c MFr Bld: 6.6 % — ABNORMAL HIGH (ref 4.6–6.5)

## 2014-04-12 DIAGNOSIS — D229 Melanocytic nevi, unspecified: Secondary | ICD-10-CM

## 2014-04-12 HISTORY — DX: Melanocytic nevi, unspecified: D22.9

## 2014-04-15 ENCOUNTER — Other Ambulatory Visit: Payer: Self-pay | Admitting: Family Medicine

## 2014-04-15 NOTE — Telephone Encounter (Signed)
Patient due for office visit with TSH.  Patient scheduled for 05/16/14.  Rx sent to last until this time.   eal

## 2014-04-29 ENCOUNTER — Other Ambulatory Visit: Payer: Self-pay | Admitting: Family Medicine

## 2014-05-16 ENCOUNTER — Encounter: Payer: Managed Care, Other (non HMO) | Admitting: Family Medicine

## 2014-05-29 ENCOUNTER — Other Ambulatory Visit: Payer: Self-pay | Admitting: Family Medicine

## 2014-06-22 ENCOUNTER — Other Ambulatory Visit (INDEPENDENT_AMBULATORY_CARE_PROVIDER_SITE_OTHER): Payer: BLUE CROSS/BLUE SHIELD

## 2014-06-22 DIAGNOSIS — E785 Hyperlipidemia, unspecified: Secondary | ICD-10-CM | POA: Diagnosis not present

## 2014-06-22 LAB — BASIC METABOLIC PANEL
BUN: 12 mg/dL (ref 6–23)
CO2: 26 mEq/L (ref 19–32)
Calcium: 9.2 mg/dL (ref 8.4–10.5)
Chloride: 105 mEq/L (ref 96–112)
Creatinine, Ser: 0.83 mg/dL (ref 0.40–1.20)
GFR: 74.46 mL/min (ref >60.00)
Glucose, Bld: 123 mg/dL — ABNORMAL HIGH (ref 70–99)
POTASSIUM: 4 meq/L (ref 3.5–5.1)
Sodium: 138 mEq/L (ref 135–145)

## 2014-06-22 LAB — HEPATIC FUNCTION PANEL
ALBUMIN: 4.5 g/dL (ref 3.5–5.2)
ALT: 19 U/L (ref 0–35)
AST: 19 U/L (ref 0–37)
Alkaline Phosphatase: 43 U/L (ref 39–117)
BILIRUBIN TOTAL: 0.5 mg/dL (ref 0.2–1.2)
Bilirubin, Direct: 0.1 mg/dL (ref 0.0–0.3)
TOTAL PROTEIN: 7.1 g/dL (ref 6.0–8.3)

## 2014-06-22 LAB — LIPID PANEL
CHOLESTEROL: 150 mg/dL (ref 0–200)
HDL: 42.9 mg/dL (ref >39.00)
LDL Cholesterol: 79 mg/dL (ref 0–99)
NonHDL: 107.1
TRIGLYCERIDES: 143 mg/dL (ref 0.0–149.0)
Total CHOL/HDL Ratio: 3
VLDL: 28.6 mg/dL (ref 0.0–40.0)

## 2014-06-22 LAB — HEMOGLOBIN A1C: Hgb A1c MFr Bld: 6.5 % (ref 4.6–6.5)

## 2014-07-19 ENCOUNTER — Other Ambulatory Visit: Payer: Self-pay | Admitting: Family Medicine

## 2014-08-25 ENCOUNTER — Other Ambulatory Visit: Payer: Self-pay | Admitting: Family Medicine

## 2014-08-28 ENCOUNTER — Other Ambulatory Visit: Payer: Self-pay | Admitting: Family Medicine

## 2014-10-18 ENCOUNTER — Other Ambulatory Visit: Payer: Self-pay | Admitting: Family Medicine

## 2014-10-18 NOTE — Telephone Encounter (Signed)
Please schedule this patient an apt before I can send her med's. She will need to be fasting. Thanks     KP

## 2014-10-18 NOTE — Telephone Encounter (Signed)
Patient scheduled fasting appointment for 10/25/14

## 2014-10-21 ENCOUNTER — Other Ambulatory Visit: Payer: Self-pay

## 2014-10-21 MED ORDER — LEVOTHYROXINE SODIUM 75 MCG PO TABS: ORAL_TABLET | ORAL | Status: DC

## 2014-10-25 ENCOUNTER — Ambulatory Visit (INDEPENDENT_AMBULATORY_CARE_PROVIDER_SITE_OTHER): Payer: BLUE CROSS/BLUE SHIELD | Admitting: Family Medicine

## 2014-10-25 ENCOUNTER — Encounter: Payer: Self-pay | Admitting: Family Medicine

## 2014-10-25 VITALS — BP 120/70 | HR 64 | Temp 98.4°F | Ht 66.0 in | Wt 209.6 lb

## 2014-10-25 DIAGNOSIS — E781 Pure hyperglyceridemia: Secondary | ICD-10-CM

## 2014-10-25 DIAGNOSIS — I10 Essential (primary) hypertension: Secondary | ICD-10-CM | POA: Diagnosis not present

## 2014-10-25 DIAGNOSIS — R739 Hyperglycemia, unspecified: Secondary | ICD-10-CM

## 2014-10-25 DIAGNOSIS — E039 Hypothyroidism, unspecified: Secondary | ICD-10-CM

## 2014-10-25 LAB — LIPID PANEL
CHOL/HDL RATIO: 4
CHOLESTEROL: 151 mg/dL (ref 0–200)
HDL: 41.5 mg/dL (ref >39.00)
LDL CALC: 77 mg/dL (ref 0–99)
NONHDL: 109.5
Triglycerides: 162 mg/dL — ABNORMAL HIGH (ref 0.0–149.0)
VLDL: 32.4 mg/dL (ref 0.0–40.0)

## 2014-10-25 LAB — BASIC METABOLIC PANEL
BUN: 10 mg/dL (ref 6–23)
CALCIUM: 9.7 mg/dL (ref 8.4–10.5)
CO2: 29 mEq/L (ref 19–32)
Chloride: 102 mEq/L (ref 96–112)
Creatinine, Ser: 0.87 mg/dL (ref 0.40–1.20)
GFR: 70.44 mL/min (ref >60.00)
Glucose, Bld: 112 mg/dL — ABNORMAL HIGH (ref 70–99)
POTASSIUM: 3.8 meq/L (ref 3.5–5.1)
SODIUM: 139 meq/L (ref 135–145)

## 2014-10-25 LAB — HEPATIC FUNCTION PANEL
ALT: 17 U/L (ref 0–35)
AST: 17 U/L (ref 0–37)
Albumin: 4.6 g/dL (ref 3.5–5.2)
Alkaline Phosphatase: 45 U/L (ref 39–117)
BILIRUBIN TOTAL: 0.6 mg/dL (ref 0.2–1.2)
Bilirubin, Direct: 0.1 mg/dL (ref 0.0–0.3)
Total Protein: 7.3 g/dL (ref 6.0–8.3)

## 2014-10-25 LAB — TSH: TSH: 8.43 u[IU]/mL — ABNORMAL HIGH (ref 0.35–4.50)

## 2014-10-25 LAB — HEMOGLOBIN A1C: HEMOGLOBIN A1C: 6.4 % (ref 4.6–6.5)

## 2014-10-25 MED ORDER — LEVOTHYROXINE SODIUM 75 MCG PO TABS: ORAL_TABLET | ORAL | Status: DC

## 2014-10-25 MED ORDER — FENOFIBRATE MICRONIZED 90 MG PO CAPS: 1.0000 | ORAL_CAPSULE | Freq: Every day | ORAL | Status: DC

## 2014-10-25 MED ORDER — NEBIVOLOL HCL 10 MG PO TABS: ORAL_TABLET | ORAL | Status: DC

## 2014-10-25 NOTE — Progress Notes (Signed)
Patient ID: Diane Hodges, female    DOB: 04/01/1956  Age: 59 y.o. MRN: [id number]    Subjective:  Subjective HPI Diane Hodges presents for f/u bp , tryglycerides and thyroid.  Pt with no complaints.  No cp, sob, paliptations.  No muscle aches.  No symptoms of thyroid abnormality.   Pt has been watching her sugar and is using stevia and sugar sub.  No more sodas.    Review of Systems  Constitutional: Negative for diaphoresis, appetite change, fatigue and unexpected weight change.  Eyes: Negative for pain, redness and visual disturbance.  Respiratory: Negative for cough, chest tightness, shortness of breath and wheezing.   Cardiovascular: Negative for chest pain, palpitations and leg swelling.  Endocrine: Negative for cold intolerance, heat intolerance, polydipsia, polyphagia and polyuria.  Genitourinary: Negative for dysuria, frequency and difficulty urinating.  Neurological: Negative for dizziness, light-headedness, numbness and headaches.  Psychiatric/Behavioral: Negative for behavioral problems and dysphoric mood. The patient is not nervous/anxious.     History Past Medical History  Diagnosis Date  . Thyroid disease     Hypothyridism  . Hypertension     She has past surgical history that includes Oophorectomy (1980) and Cataract extraction (Right, 2015).   Her family history includes Hypertension in her father and mother; Thyroid disease in an other family member.She reports that she has never smoked. She has never used smokeless tobacco. She reports that she does not drink alcohol or use illicit drugs.  Current Outpatient Prescriptions on File Prior to Visit  Medication Sig Dispense Refill  . glucose blood test strip Onetouch verio test strips. Check blood sugar once a day 100 each 0  . ONETOUCH DELICA LANCETS FINE MISC 1 Device by Does not apply route daily. 100 each 0   No current facility-administered medications on file prior to visit.     Objective:    Objective Physical Exam  Constitutional: She is oriented to person, place, and time. She appears well-developed and well-nourished.  HENT:  Head: Normocephalic and atraumatic.  Eyes: Conjunctivae and EOM are normal.  Neck: Normal range of motion. Neck supple. No JVD present. Carotid bruit is not present. No thyromegaly present.  Cardiovascular: Normal rate, regular rhythm and normal heart sounds.   No murmur heard. Pulmonary/Chest: Effort normal and breath sounds normal. No respiratory distress. She has no wheezes. She has no rales. She exhibits no tenderness.  Musculoskeletal: She exhibits no edema or tenderness.  Neurological: She is alert and oriented to person, place, and time.  Psychiatric: She has a normal mood and affect. Her behavior is normal. Judgment and thought content normal.   BP 120/70 mmHg  Pulse 64  Temp(Src) 98.4 F (36.9 C) (Oral)  Ht 5\' 6"  (1.676 m)  Wt 209 lb 9.6 oz (95.074 kg)  BMI 33.85 kg/m2  SpO2 98% Wt Readings from Last 3 Encounters:  10/25/14 209 lb 9.6 oz (95.074 kg)  05/13/13 205 lb 12.8 oz (93.35 kg)  02/19/13 205 lb (92.987 kg)     Lab Results  Component Value Date   WBC 5.9 03/10/2014   HGB 13.6 03/10/2014   HCT 40.3 03/10/2014   PLT 223.0 03/10/2014   GLUCOSE 112* 10/25/2014   CHOL 151 10/25/2014   TRIG 162.0* 10/25/2014   HDL 41.50 10/25/2014   LDLDIRECT 98.1 02/17/2009   LDLCALC 77 10/25/2014   ALT 17 10/25/2014   AST 17 10/25/2014   NA 139 10/25/2014   K 3.8 10/25/2014   CL 102 10/25/2014   CREATININE  0.87 10/25/2014   BUN 10 10/25/2014   CO2 29 10/25/2014   TSH 8.43* 10/25/2014   HGBA1C 6.4 10/25/2014   MICROALBUR 0.4 02/19/2013    No results found.   Assessment & Plan:  Plan I have discontinued Ms. Rundquist's levothyroxine. I have also changed her BYSTOLIC to nebivolol. Additionally, I am having her maintain her glucose blood, ONETOUCH DELICA LANCETS FINE, and Fenofibrate Micronized.  Meds ordered this encounter   Medications  . nebivolol (BYSTOLIC) 10 MG tablet    Sig: TAKE 1 TABLET (10 MG TOTAL) BY MOUTH DAILY.    Dispense:  90 tablet    Refill:  1    D/C PREVIOUS SCRIPTS FOR THIS MEDICATION  . Fenofibrate Micronized (ANTARA) 90 MG CAPS    Sig: Take 1 capsule by mouth daily.    Dispense:  90 capsule    Refill:  1    D/C PREVIOUS SCRIPTS FOR THIS MEDICATION  . DISCONTD: levothyroxine (SYNTHROID, LEVOTHROID) 75 MCG tablet    Sig: TAKE 1 TABLET (75 MCG TOTAL) BY MOUTH DAILY BEFORE BREAKFAST.    Dispense:  90 tablet    Refill:  3    Problem List Items Addressed This Visit    None    Visit Diagnoses    Hypothyroidism, unspecified hypothyroidism type    -  Primary    Relevant Medications    nebivolol (BYSTOLIC) 10 MG tablet    Other Relevant Orders    TSH (Completed)    Hypertriglyceridemia        Relevant Medications    nebivolol (BYSTOLIC) 10 MG tablet    Fenofibrate Micronized (ANTARA) 90 MG CAPS    Other Relevant Orders    Hepatic function panel (Completed)    Lipid panel (Completed)    Essential hypertension        Relevant Medications    nebivolol (BYSTOLIC) 10 MG tablet    Fenofibrate Micronized (ANTARA) 90 MG CAPS    Other Relevant Orders    Basic metabolic panel (Completed)    Hyperglycemia        Relevant Orders    Hemoglobin A1c (Completed)       Follow-up: Return in about 6 months (around 04/27/2015) for annual exam, fasting.  Garnet Koyanagi, DO

## 2014-10-25 NOTE — Patient Instructions (Signed)
Hypertension Hypertension, commonly called high blood pressure, is when the force of blood pumping through your arteries is too strong. Your arteries are the blood vessels that carry blood from your heart throughout your body. A blood pressure reading consists of a higher number over a lower number, such as 110/72. The higher number (systolic) is the pressure inside your arteries when your heart pumps. The lower number (diastolic) is the pressure inside your arteries when your heart relaxes. Ideally you want your blood pressure below 120/80. Hypertension forces your heart to work harder to pump blood. Your arteries may become narrow or stiff. Having hypertension puts you at risk for heart disease, stroke, and other problems.  RISK FACTORS Some risk factors for high blood pressure are controllable. Others are not.  Risk factors you cannot control include:   Race. You may be at higher risk if you are African American.  Age. Risk increases with age.  Gender. Men are at higher risk than women before age 48 years. After age 46, women are at higher risk than men. Risk factors you can control include:  Not getting enough exercise or physical activity.  Being overweight.  Getting                                                                          Hypertension Hypertension, commonly called high blood pressure, is when the force of blood pumping through your arteries is too strong. Your arteries are the blood vessels that carry blood from your heart throughout your body. A blood pressure reading consists of a higher number over a lower number, such as 110/72. The higher number (systolic) is the pressure inside your arteries when your heart pumps. The lower number (diastolic) is the pressure inside your arteries when your heart relaxes. Ideally you want your blood pressure below 120/80. Hypertension forces your heart to work  harder to pump blood. Your arteries may become narrow or stiff. Having hypertension puts you at risk for heart disease, stroke, and other problems.  RISK FACTORS Some risk factors for high blood pressure are controllable. Others are not.  Risk factors you cannot control include:   Race. You may be at higher risk if you are African American.  Age. Risk increases with age.  Gender. Men are at higher risk than women before age 84 years. After age 6, women are at higher risk than men. Risk factors you can control include:  Not getting enough exercise or physical activity.  Being overweight.  Getting too much fat, sugar, calories, or salt in your diet.  Drinking too much alcohol. SIGNS AND SYMPTOMS Hypertension does not usually cause signs or symptoms. Extremely high blood pressure (hypertensive crisis) may cause headache, anxiety, shortness of breath, and nosebleed. DIAGNOSIS  To check if you have hypertension, your health care provider will measure your blood pressure while you are seated, with your arm held at the level of your heart. It should be measured at least twice using the same arm. Certain conditions can cause a difference in blood pressure between your right and left arms. A blood pressure reading that is higher than normal on one occasion does not mean that you need treatment. If one blood pressure reading is high,  ask your health care provider about having it checked again. TREATMENT  Treating high blood pressure includes making lifestyle changes and possibly taking medicine. Living a healthy lifestyle can help lower high blood pressure. You may need to change some of your habits. Lifestyle changes may include:  Following the DASH diet. This diet is high in fruits, vegetables, and whole grains. It is low in salt, red meat, and added sugars.  Getting at least 2 hours of brisk physical activity every week.  Losing weight if necessary.  Not smoking.  Limiting alcoholic  beverages.  Learning ways to reduce stress. If lifestyle changes are not enough to get your blood pressure under control, your health care provider may prescribe medicine. You may need to take more than one. Work closely with your health care provider to understand the risks and benefits. HOME CARE INSTRUCTIONS  Have your blood pressure rechecked as directed by your health care provider.   Take medicines only as directed by your health care provider. Follow the directions carefully. Blood pressure medicines must be taken as prescribed. The medicine does not work as well when you skip doses. Skipping doses also puts you at risk for problems.   Do not smoke.   Monitor your blood pressure at home as directed by your health care provider. SEEK MEDICAL CARE IF:   You think you are having a reaction to medicines taken.  You have recurrent headaches or feel dizzy.  You have swelling in your ankles.  You have trouble with your vision. SEEK IMMEDIATE MEDICAL CARE IF:  You develop a severe headache or confusion.  You have unusual weakness, numbness, or feel faint.  You have severe chest or abdominal pain.  You vomit repeatedly.  You have trouble breathing. MAKE SURE YOU:   Understand these instructions.  Will watch your condition.  Will get help right away if you are not doing well or get worse. Document Released: 03/18/2005 Document Revised: 08/02/2013 Document Reviewed: 01/08/2013 [facility] Patient Information 2015 [place], [place]. This information is not intended to replace advice given to you by your health care provider. Make sure you discuss any questions you have with your health care provider.   g too much fat, sugar, calories, or salt in your diet.  Drinking too much alcohol. SIGNS AND SYMPTOMS Hypertension does not usually cause signs or symptoms. Extremely high blood pressure (hypertensive crisis) may cause headache, anxiety, shortness of breath, and  nosebleed. DIAGNOSIS  To check if you have hypertension, your health care provider will measure your blood pressure while you are seated, with your arm held at the level of your heart. It should be measured at least twice using the same arm. Certain conditions can cause a difference in blood pressure between your right and left arms. A blood pressure reading that is higher than normal on one occasion does not mean that you need treatment. If one blood pressure reading is high, ask your health care provider about having it checked again. TREATMENT  Treating high blood pressure includes making lifestyle changes and possibly taking medicine. Living a healthy lifestyle can help lower high blood pressure. You may need to change some of your habits. Lifestyle changes may include:  Following the DASH diet. This diet is high in fruits, vegetables, and whole grains. It is low in salt, red meat, and added sugars.  Getting at least 2 hours of brisk physical activity every week.  Losing weight if necessary.  Not smoking.  Limiting alcoholic beverages.  Learning ways  to reduce stress. If lifestyle changes are not enough to get your blood pressure under control, your health care provider may prescribe medicine. You may need to take more than one. Work closely with your health care provider to understand the risks and benefits. HOME CARE INSTRUCTIONS  Have your blood pressure rechecked as directed by your health care provider.   Take medicines only as directed by your health care provider. Follow the directions carefully. Blood pressure medicines must be taken as prescribed. The medicine does not work as well when you skip doses. Skipping doses also puts you at risk for problems.   Do not smoke.   Monitor your blood pressure at home as directed by your health care provider. SEEK MEDICAL CARE IF:   You think you are having a reaction to medicines taken.  You have recurrent headaches or feel  dizzy.  You have swelling in your ankles.  You have trouble with your vision. SEEK IMMEDIATE MEDICAL CARE IF:  You develop a severe headache or confusion.  You have unusual weakness, numbness, or feel faint.  You have severe chest or abdominal pain.  You vomit repeatedly.  You have trouble breathing. MAKE SURE YOU:   Understand these instructions.  Will watch your condition.  Will get help right away if you are not doing well or get worse. Document Released: 03/18/2005 Document Revised: 08/02/2013 Document Reviewed: 01/08/2013 [facility] Patient Information 2015 [place], [place]. This information is not intended to replace advice given to you by your health care provider. Make sure you discuss any questions you have with your health care provider.

## 2014-10-25 NOTE — Progress Notes (Signed)
Pre visit review using our clinic review tool, if applicable. No additional management support is needed unless otherwise documented below in the visit note. 

## 2014-10-26 ENCOUNTER — Telehealth: Payer: Self-pay | Admitting: Family Medicine

## 2014-10-26 DIAGNOSIS — E039 Hypothyroidism, unspecified: Secondary | ICD-10-CM

## 2014-10-26 DIAGNOSIS — I10 Essential (primary) hypertension: Secondary | ICD-10-CM

## 2014-10-26 DIAGNOSIS — E785 Hyperlipidemia, unspecified: Secondary | ICD-10-CM

## 2014-10-26 MED ORDER — LEVOTHYROXINE SODIUM 100 MCG PO TABS: 100.0000 ug | ORAL_TABLET | Freq: Every day | ORAL | Status: DC

## 2014-10-26 NOTE — Telephone Encounter (Signed)
Pt returning call. Please call back at [phone]. Takes lunch from 1:30pm-2:00pm.

## 2014-10-26 NOTE — Telephone Encounter (Signed)
Notified pt of 10/26/14 lab results and scheduled lab appt for 12/27/14 at 9am. Future lab orders entered. Rx sent.

## 2014-10-26 NOTE — Telephone Encounter (Signed)
See other phone note

## 2014-10-26 NOTE — Telephone Encounter (Addendum)
Relation to pt: self Call back number: best # [phone]   Reason for call:  Patient inquiring about lab results taken 10/25/2014

## 2014-12-27 ENCOUNTER — Other Ambulatory Visit (INDEPENDENT_AMBULATORY_CARE_PROVIDER_SITE_OTHER): Payer: BLUE CROSS/BLUE SHIELD

## 2014-12-27 DIAGNOSIS — E785 Hyperlipidemia, unspecified: Secondary | ICD-10-CM | POA: Diagnosis not present

## 2014-12-27 DIAGNOSIS — E039 Hypothyroidism, unspecified: Secondary | ICD-10-CM | POA: Diagnosis not present

## 2014-12-27 DIAGNOSIS — I10 Essential (primary) hypertension: Secondary | ICD-10-CM | POA: Diagnosis not present

## 2014-12-27 LAB — BASIC METABOLIC PANEL
BUN: 12 mg/dL (ref 6–23)
CHLORIDE: 105 meq/L (ref 96–112)
CO2: 28 mEq/L (ref 19–32)
CREATININE: 0.8 mg/dL (ref 0.40–1.20)
Calcium: 9.4 mg/dL (ref 8.4–10.5)
GFR: 77.56 mL/min (ref >60.00)
Glucose, Bld: 124 mg/dL — ABNORMAL HIGH (ref 70–99)
POTASSIUM: 3.9 meq/L (ref 3.5–5.1)
Sodium: 141 mEq/L (ref 135–145)

## 2014-12-27 LAB — HEPATIC FUNCTION PANEL
ALT: 17 U/L (ref 0–35)
AST: 17 U/L (ref 0–37)
Albumin: 4.7 g/dL (ref 3.5–5.2)
Alkaline Phosphatase: 48 U/L (ref 39–117)
BILIRUBIN DIRECT: 0.1 mg/dL (ref 0.0–0.3)
BILIRUBIN TOTAL: 0.4 mg/dL (ref 0.2–1.2)
Total Protein: 7.3 g/dL (ref 6.0–8.3)

## 2014-12-27 LAB — LIPID PANEL
CHOL/HDL RATIO: 3
CHOLESTEROL: 138 mg/dL (ref 0–200)
HDL: 41.8 mg/dL (ref >39.00)
LDL CALC: 71 mg/dL (ref 0–99)
NonHDL: 96.44
TRIGLYCERIDES: 125 mg/dL (ref 0.0–149.0)
VLDL: 25 mg/dL (ref 0.0–40.0)

## 2014-12-27 LAB — TSH: TSH: 2.55 u[IU]/mL (ref 0.35–4.50)

## 2015-01-09 ENCOUNTER — Telehealth: Payer: Self-pay

## 2015-01-09 DIAGNOSIS — R739 Hyperglycemia, unspecified: Secondary | ICD-10-CM

## 2015-01-09 NOTE — Telephone Encounter (Signed)
Lab appointment scheduled.  Future labs ordered.

## 2015-01-09 NOTE — Telephone Encounter (Signed)
-----   Message from Rosalita Chessman, DO sent at 01/05/2015  8:52 PM EDT ----- Glucose elevated--- repeat fasting with hgba1c  Bmp, hgba1c ---hyperglycemia

## 2015-01-17 ENCOUNTER — Other Ambulatory Visit (INDEPENDENT_AMBULATORY_CARE_PROVIDER_SITE_OTHER): Payer: BLUE CROSS/BLUE SHIELD

## 2015-01-17 DIAGNOSIS — R739 Hyperglycemia, unspecified: Secondary | ICD-10-CM

## 2015-01-17 LAB — HEMOGLOBIN A1C: Hgb A1c MFr Bld: 6.4 % (ref 4.6–6.5)

## 2015-01-17 LAB — BASIC METABOLIC PANEL
BUN: 12 mg/dL (ref 6–23)
CO2: 29 meq/L (ref 19–32)
Calcium: 9.7 mg/dL (ref 8.4–10.5)
Chloride: 103 mEq/L (ref 96–112)
Creatinine, Ser: 0.85 mg/dL (ref 0.40–1.20)
GFR: 72.3 mL/min (ref >60.00)
Glucose, Bld: 129 mg/dL — ABNORMAL HIGH (ref 70–99)
Potassium: 4 mEq/L (ref 3.5–5.1)
SODIUM: 139 meq/L (ref 135–145)

## 2015-01-23 NOTE — Addendum Note (Signed)
Addended by: Tasia Catchings on: 01/23/2015 09:24 AM   Modules accepted: Orders

## 2015-01-27 ENCOUNTER — Other Ambulatory Visit: Payer: Self-pay

## 2015-01-27 MED ORDER — LEVOTHYROXINE SODIUM 100 MCG PO TABS: 100.0000 ug | ORAL_TABLET | Freq: Every day | ORAL | Status: DC

## 2015-04-26 ENCOUNTER — Telehealth: Payer: Self-pay | Admitting: Family Medicine

## 2015-04-26 NOTE — Telephone Encounter (Signed)
Left msg to schedule CPE (due now) and update info on flu shot

## 2015-05-05 ENCOUNTER — Telehealth: Payer: Self-pay | Admitting: Family Medicine

## 2015-05-05 NOTE — Telephone Encounter (Signed)
LM to schedule flu shot and CPE (last 05/13/13)

## 2015-06-13 ENCOUNTER — Encounter: Payer: BLUE CROSS/BLUE SHIELD | Admitting: Family Medicine

## 2015-06-29 ENCOUNTER — Ambulatory Visit (INDEPENDENT_AMBULATORY_CARE_PROVIDER_SITE_OTHER): Payer: BLUE CROSS/BLUE SHIELD | Admitting: Family Medicine

## 2015-06-29 ENCOUNTER — Encounter: Payer: Self-pay | Admitting: Family Medicine

## 2015-06-29 VITALS — BP 116/78 | HR 64 | Temp 98.9°F | Wt 208.2 lb

## 2015-06-29 DIAGNOSIS — Z1159 Encounter for screening for other viral diseases: Secondary | ICD-10-CM

## 2015-06-29 DIAGNOSIS — E785 Hyperlipidemia, unspecified: Secondary | ICD-10-CM

## 2015-06-29 DIAGNOSIS — E039 Hypothyroidism, unspecified: Secondary | ICD-10-CM | POA: Diagnosis not present

## 2015-06-29 DIAGNOSIS — I10 Essential (primary) hypertension: Secondary | ICD-10-CM | POA: Diagnosis not present

## 2015-06-29 DIAGNOSIS — E781 Pure hyperglyceridemia: Secondary | ICD-10-CM

## 2015-06-29 DIAGNOSIS — Z114 Encounter for screening for human immunodeficiency virus [HIV]: Secondary | ICD-10-CM

## 2015-06-29 LAB — HIV ANTIBODY (ROUTINE TESTING W REFLEX): HIV 1&2 Ab, 4th Generation: NONREACTIVE

## 2015-06-29 LAB — LIPID PANEL
CHOLESTEROL: 148 mg/dL (ref 0–200)
HDL: 43.8 mg/dL (ref >39.00)
LDL CALC: 77 mg/dL (ref 0–99)
NonHDL: 103.77
TRIGLYCERIDES: 133 mg/dL (ref 0.0–149.0)
Total CHOL/HDL Ratio: 3
VLDL: 26.6 mg/dL (ref 0.0–40.0)

## 2015-06-29 LAB — COMPREHENSIVE METABOLIC PANEL
ALBUMIN: 4.7 g/dL (ref 3.5–5.2)
ALK PHOS: 49 U/L (ref 39–117)
ALT: 18 U/L (ref 0–35)
AST: 18 U/L (ref 0–37)
BILIRUBIN TOTAL: 0.5 mg/dL (ref 0.2–1.2)
BUN: 12 mg/dL (ref 6–23)
CALCIUM: 9.4 mg/dL (ref 8.4–10.5)
CHLORIDE: 104 meq/L (ref 96–112)
CO2: 29 mEq/L (ref 19–32)
CREATININE: 0.82 mg/dL (ref 0.40–1.20)
GFR: 75.25 mL/min (ref >60.00)
Glucose, Bld: 129 mg/dL — ABNORMAL HIGH (ref 70–99)
Potassium: 4 mEq/L (ref 3.5–5.1)
Sodium: 140 mEq/L (ref 135–145)
TOTAL PROTEIN: 7.6 g/dL (ref 6.0–8.3)

## 2015-06-29 LAB — HEPATITIS C ANTIBODY: HCV Ab: NEGATIVE

## 2015-06-29 LAB — TSH: TSH: 2.26 u[IU]/mL (ref 0.35–4.50)

## 2015-06-29 MED ORDER — FENOFIBRATE MICRONIZED 90 MG PO CAPS: 1.0000 | ORAL_CAPSULE | Freq: Every day | ORAL | Status: DC

## 2015-06-29 MED ORDER — NEBIVOLOL HCL 10 MG PO TABS: ORAL_TABLET | ORAL | Status: DC

## 2015-06-29 NOTE — Patient Instructions (Signed)
Hypertension Hypertension, commonly called high blood pressure, is when the force of blood pumping through your arteries is too strong. Your arteries are the blood vessels that carry blood from your heart throughout your body. A blood pressure reading consists of a higher number over a lower number, such as 110/72. The higher number (systolic) is the pressure inside your arteries when your heart pumps. The lower number (diastolic) is the pressure inside your arteries when your heart relaxes. Ideally you want your blood pressure below 120/80. Hypertension forces your heart to work harder to pump blood. Your arteries may become narrow or stiff. Having untreated or uncontrolled hypertension can cause heart attack, stroke, kidney disease, and other problems. RISK FACTORS Some risk factors for high blood pressure are controllable. Others are not.  Risk factors you cannot control include:   Race. You may be at higher risk if you are African American.  Age. Risk increases with age.  Gender. Men are at higher risk than women before age 45 years. After age 65, women are at higher risk than men. Risk factors you can control include:  Not getting enough exercise or physical activity.  Being overweight.  Getting too much fat, sugar, calories, or salt in your diet.  Drinking too much alcohol. SIGNS AND SYMPTOMS Hypertension does not usually cause signs or symptoms. Extremely high blood pressure (hypertensive crisis) may cause headache, anxiety, shortness of breath, and nosebleed. DIAGNOSIS To check if you have hypertension, your health care provider will measure your blood pressure while you are seated, with your arm held at the level of your heart. It should be measured at least twice using the same arm. Certain conditions can cause a difference in blood pressure between your right and left arms. A blood pressure reading that is higher than normal on one occasion does not mean that you need treatment. If  it is not clear whether you have high blood pressure, you may be asked to return on a different day to have your blood pressure checked again. Or, you may be asked to monitor your blood pressure at home for 1 or more weeks. TREATMENT Treating high blood pressure includes making lifestyle changes and possibly taking medicine. Living a healthy lifestyle can help lower high blood pressure. You may need to change some of your habits. Lifestyle changes may include:  Following the DASH diet. This diet is high in fruits, vegetables, and whole grains. It is low in salt, red meat, and added sugars.  Keep your sodium intake below 2,300 mg per day.  Getting at least 30-45 minutes of aerobic exercise at least 4 times per week.  Losing weight if necessary.  Not smoking.  Limiting alcoholic beverages.  Learning ways to reduce stress. Your health care provider may prescribe medicine if lifestyle changes are not enough to get your blood pressure under control, and if one of the following is true:  You are 18-59 years of age and your systolic blood pressure is above 140.  You are 60 years of age or older, and your systolic blood pressure is above 150.  Your diastolic blood pressure is above 90.  You have diabetes, and your systolic blood pressure is over 140 or your diastolic blood pressure is over 90.  You have kidney disease and your blood pressure is above 140/90.  You have heart disease and your blood pressure is above 140/90. Your personal target blood pressure may vary depending on your medical conditions, your age, and other factors. HOME CARE INSTRUCTIONS    Have your blood pressure rechecked as directed by your health care provider.   Take medicines only as directed by your health care provider. Follow the directions carefully. Blood pressure medicines must be taken as prescribed. The medicine does not work as well when you skip doses. Skipping doses also puts you at risk for  problems.  Do not smoke.   Monitor your blood pressure at home as directed by your health care provider. SEEK MEDICAL CARE IF:   You think you are having a reaction to medicines taken.  You have recurrent headaches or feel dizzy.  You have swelling in your ankles.  You have trouble with your vision. SEEK IMMEDIATE MEDICAL CARE IF:  You develop a severe headache or confusion.  You have unusual weakness, numbness, or feel faint.  You have severe chest or abdominal pain.  You vomit repeatedly.  You have trouble breathing. MAKE SURE YOU:   Understand these instructions.  Will watch your condition.  Will get help right away if you are not doing well or get worse.   This information is not intended to replace advice given to you by your health care provider. Make sure you discuss any questions you have with your health care provider.   Document Released: 03/18/2005 Document Revised: 08/02/2014 Document Reviewed: 01/08/2013 [employer] Interactive Patient Education 2016 [employer]  

## 2015-06-29 NOTE — Progress Notes (Signed)
Patient ID: Diane Hodges, female    DOB: Mar 30, 1956  Age: 60 y.o. MRN: [id number]    Subjective:  Subjective HPI Diane Hodges presents for f/u bp , cholesterol and thyroid.  No complaints.    Review of Systems  Constitutional: Negative for diaphoresis, appetite change, fatigue and unexpected weight change.  Eyes: Negative for pain, redness and visual disturbance.  Respiratory: Negative for cough, chest tightness, shortness of breath and wheezing.   Cardiovascular: Negative for chest pain, palpitations and leg swelling.  Endocrine: Negative for cold intolerance, heat intolerance, polydipsia, polyphagia and polyuria.  Genitourinary: Negative for dysuria, frequency and difficulty urinating.  Neurological: Negative for dizziness, light-headedness, numbness and headaches.    History Past Medical History  Diagnosis Date  . Thyroid disease     Hypothyridism  . Hypertension     She has past surgical history that includes Oophorectomy (1980) and Cataract extraction (Right, 2015).   Her family history includes Hypertension in her father and mother.She reports that she has never smoked. She has never used smokeless tobacco. She reports that she does not drink alcohol or use illicit drugs.  Current Outpatient Prescriptions on File Prior to Visit  Medication Sig Dispense Refill  . glucose blood test strip Onetouch verio test strips. Check blood sugar once a day 100 each 0  . levothyroxine (SYNTHROID, LEVOTHROID) 100 MCG tablet Take 1 tablet (100 mcg total) by mouth daily. 90 tablet 2  . ONETOUCH DELICA LANCETS FINE MISC 1 Device by Does not apply route daily. 100 each 0   No current facility-administered medications on file prior to visit.     Objective:  Objective Physical Exam  Constitutional: She is oriented to person, place, and time. She appears well-developed and well-nourished.  HENT:  Head: Normocephalic and atraumatic.  Eyes: Conjunctivae and EOM are normal.    Neck: Normal range of motion. Neck supple. No JVD present. Carotid bruit is not present. No thyromegaly present.  Cardiovascular: Normal rate, regular rhythm and normal heart sounds.   No murmur heard. Pulmonary/Chest: Effort normal and breath sounds normal. No respiratory distress. She has no wheezes. She has no rales. She exhibits no tenderness.  Musculoskeletal: She exhibits no edema.  Neurological: She is alert and oriented to person, place, and time.  Psychiatric: She has a normal mood and affect. Her behavior is normal.  Nursing note and vitals reviewed.  BP 116/78 mmHg  Pulse 64  Temp(Src) 98.9 F (37.2 C) (Oral)  Wt 208 lb 3.2 oz (94.439 kg)  SpO2 97% Wt Readings from Last 3 Encounters:  06/29/15 208 lb 3.2 oz (94.439 kg)  10/25/14 209 lb 9.6 oz (95.074 kg)  05/13/13 205 lb 12.8 oz (93.35 kg)     Lab Results  Component Value Date   WBC 5.9 03/10/2014   HGB 13.6 03/10/2014   HCT 40.3 03/10/2014   PLT 223.0 03/10/2014   GLUCOSE 129* 06/29/2015   CHOL 148 06/29/2015   TRIG 133.0 06/29/2015   HDL 43.80 06/29/2015   LDLDIRECT 98.1 02/17/2009   LDLCALC 77 06/29/2015   ALT 18 06/29/2015   AST 18 06/29/2015   NA 140 06/29/2015   K 4.0 06/29/2015   CL 104 06/29/2015   CREATININE 0.82 06/29/2015   BUN 12 06/29/2015   CO2 29 06/29/2015   TSH 2.26 06/29/2015   HGBA1C 6.4 01/17/2015   MICROALBUR 0.4 02/19/2013    No results found.   Assessment & Plan:  Plan I am having Diane Hodges maintain her glucose  blood, ONETOUCH DELICA LANCETS FINE, levothyroxine, nebivolol, and Fenofibrate Micronized.  Meds ordered this encounter  Medications  . nebivolol (BYSTOLIC) 10 MG tablet    Sig: TAKE 1 TABLET (10 MG TOTAL) BY MOUTH DAILY.    Dispense:  90 tablet    Refill:  1    D/C PREVIOUS SCRIPTS FOR THIS MEDICATION  . Fenofibrate Micronized (ANTARA) 90 MG CAPS    Sig: Take 1 capsule by mouth daily.    Dispense:  90 capsule    Refill:  1    D/C PREVIOUS SCRIPTS FOR THIS  MEDICATION    Problem List Items Addressed This Visit    None    Visit Diagnoses    Essential hypertension    -  Primary    Relevant Medications    nebivolol (BYSTOLIC) 10 MG tablet    Fenofibrate Micronized (ANTARA) 90 MG CAPS    Other Relevant Orders    Comprehensive metabolic panel (Completed)    Lipid panel (Completed)    Hyperlipidemia        Relevant Medications    nebivolol (BYSTOLIC) 10 MG tablet    Fenofibrate Micronized (ANTARA) 90 MG CAPS    Other Relevant Orders    Comprehensive metabolic panel (Completed)    Lipid panel (Completed)    Hypothyroidism, unspecified hypothyroidism type        Relevant Medications    nebivolol (BYSTOLIC) 10 MG tablet    Other Relevant Orders    TSH (Completed)    Need for hepatitis C screening test        Relevant Orders    Hepatitis C antibody (Completed)    Encounter for screening for HIV        Relevant Orders    HIV antibody (Completed)    Hypertriglyceridemia        Relevant Medications    nebivolol (BYSTOLIC) 10 MG tablet    Fenofibrate Micronized (ANTARA) 90 MG CAPS       Follow-up: Return in about 6 months (around 12/30/2015).  [name], DO

## 2015-06-29 NOTE — Progress Notes (Signed)
Pre visit review using our clinic review tool, if applicable. No additional management support is needed unless otherwise documented below in the visit note. 

## 2015-07-07 ENCOUNTER — Telehealth: Payer: Self-pay

## 2015-07-07 NOTE — Telephone Encounter (Signed)
-----   Message from Oneta Rack sent at 07/05/2015 11:19 AM EDT ----- Regarding: lab results  Contact: [phone] Relation to [id number] Call back number:[phone]   Reason for call:  patient returning call regarding lab results

## 2015-07-07 NOTE — Telephone Encounter (Signed)
Notes Recorded by [name], DO on 06/29/2015 at 9:41 PM Glucose elevated---- Watch simple sugars and starches Repeat fasting in 3 months----lipid, cmp, hgba1c  discussed with patient and she verbalized understanding.  She will call back if she does not receive the results by next week.    KP

## 2015-09-11 ENCOUNTER — Encounter: Payer: Self-pay | Admitting: Family Medicine

## 2015-09-11 ENCOUNTER — Ambulatory Visit (INDEPENDENT_AMBULATORY_CARE_PROVIDER_SITE_OTHER): Payer: BLUE CROSS/BLUE SHIELD | Admitting: Family Medicine

## 2015-09-11 VITALS — BP 114/70 | HR 70 | Temp 98.1°F | Ht 66.0 in | Wt 210.4 lb

## 2015-09-11 DIAGNOSIS — R739 Hyperglycemia, unspecified: Secondary | ICD-10-CM

## 2015-09-11 DIAGNOSIS — I10 Essential (primary) hypertension: Secondary | ICD-10-CM

## 2015-09-11 DIAGNOSIS — Z0000 Encounter for general adult medical examination without abnormal findings: Secondary | ICD-10-CM | POA: Diagnosis not present

## 2015-09-11 DIAGNOSIS — E785 Hyperlipidemia, unspecified: Secondary | ICD-10-CM | POA: Diagnosis not present

## 2015-09-11 DIAGNOSIS — E781 Pure hyperglyceridemia: Secondary | ICD-10-CM

## 2015-09-11 DIAGNOSIS — E039 Hypothyroidism, unspecified: Secondary | ICD-10-CM

## 2015-09-11 LAB — CBC WITH DIFFERENTIAL/PLATELET
BASOS ABS: 0 10*3/uL (ref 0.0–0.1)
BASOS PCT: 0.6 % (ref 0.0–3.0)
EOS ABS: 0.2 10*3/uL (ref 0.0–0.7)
Eosinophils Relative: 2.4 % (ref 0.0–5.0)
HEMATOCRIT: 40.1 % (ref 36.0–46.0)
Hemoglobin: 13.5 g/dL (ref 12.0–15.0)
LYMPHS ABS: 3.2 10*3/uL (ref 0.7–4.0)
LYMPHS PCT: 45.1 % (ref 12.0–46.0)
MCHC: 33.7 g/dL (ref 30.0–36.0)
MCV: 92.1 fl (ref 78.0–100.0)
MONOS PCT: 6.6 % (ref 3.0–12.0)
Monocytes Absolute: 0.5 10*3/uL (ref 0.1–1.0)
NEUTROS ABS: 3.3 10*3/uL (ref 1.4–7.7)
NEUTROS PCT: 45.3 % (ref 43.0–77.0)
PLATELETS: 206 10*3/uL (ref 150.0–400.0)
RBC: 4.36 Mil/uL (ref 3.87–5.11)
RDW: 13.2 % (ref 11.5–15.5)
WBC: 7.2 10*3/uL (ref 4.0–10.5)

## 2015-09-11 LAB — COMPREHENSIVE METABOLIC PANEL
ALT: 20 U/L (ref 0–35)
AST: 19 U/L (ref 0–37)
Albumin: 4.9 g/dL (ref 3.5–5.2)
Alkaline Phosphatase: 47 U/L (ref 39–117)
BUN: 12 mg/dL (ref 6–23)
CALCIUM: 9.8 mg/dL (ref 8.4–10.5)
CHLORIDE: 103 meq/L (ref 96–112)
CO2: 29 meq/L (ref 19–32)
Creatinine, Ser: 0.79 mg/dL (ref 0.40–1.20)
GFR: 78.51 mL/min (ref >60.00)
GLUCOSE: 105 mg/dL — AB (ref 70–99)
Potassium: 3.9 mEq/L (ref 3.5–5.1)
Sodium: 139 mEq/L (ref 135–145)
Total Bilirubin: 0.5 mg/dL (ref 0.2–1.2)
Total Protein: 7.8 g/dL (ref 6.0–8.3)

## 2015-09-11 LAB — POCT URINALYSIS DIPSTICK
Bilirubin, UA: NEGATIVE
Glucose, UA: NEGATIVE
Ketones, UA: NEGATIVE
LEUKOCYTES UA: NEGATIVE
Nitrite, UA: NEGATIVE
PH UA: 6
Protein, UA: NEGATIVE
RBC UA: NEGATIVE
Spec Grav, UA: 1.025
UROBILINOGEN UA: NEGATIVE

## 2015-09-11 LAB — LIPID PANEL
CHOLESTEROL: 162 mg/dL (ref 0–200)
HDL: 42.9 mg/dL (ref >39.00)
LDL Cholesterol: 88 mg/dL (ref 0–99)
NonHDL: 119.15
TRIGLYCERIDES: 155 mg/dL — AB (ref 0.0–149.0)
Total CHOL/HDL Ratio: 4
VLDL: 31 mg/dL (ref 0.0–40.0)

## 2015-09-11 LAB — HEMOGLOBIN A1C: Hgb A1c MFr Bld: 6.4 % (ref 4.6–6.5)

## 2015-09-11 LAB — TSH: TSH: 2.24 u[IU]/mL (ref 0.35–4.50)

## 2015-09-11 MED ORDER — FENOFIBRATE MICRONIZED 90 MG PO CAPS: 1.0000 | ORAL_CAPSULE | Freq: Every day | ORAL | Status: DC

## 2015-09-11 MED ORDER — NEBIVOLOL HCL 10 MG PO TABS: ORAL_TABLET | ORAL | Status: DC

## 2015-09-11 MED ORDER — LEVOTHYROXINE SODIUM 100 MCG PO TABS: 100.0000 ug | ORAL_TABLET | Freq: Every day | ORAL | Status: DC

## 2015-09-11 NOTE — Patient Instructions (Addendum)
Preventive Care for Adults, Female A healthy lifestyle and preventive care can promote health and wellness. Preventive health guidelines for women include the following key practices.  A routine yearly physical is a good way to check with your health care provider about your health and preventive screening. It is a chance to share any concerns and updates on your health and to receive a thorough exam.  Visit your dentist for a routine exam and preventive care every 6 months. Brush your teeth twice a day and floss once a day. Good oral hygiene prevents tooth decay and gum disease.  The frequency of eye exams is based on your age, health, family medical history, use of contact lenses, and other factors. Follow your health care provider's recommendations for frequency of eye exams.  Eat a healthy diet. Foods like vegetables, fruits, whole grains, low-fat dairy products, and lean protein foods contain the nutrients you need without too many calories. Decrease your intake of foods high in solid fats, added sugars, and salt. Eat the right amount of calories for you.Get information about a proper diet from your health care provider, if necessary.  Regular physical exercise is one of the most important things you can do for your health. Most adults should get at least 150 minutes of moderate-intensity exercise (any activity that increases your heart rate and causes you to sweat) each week. In addition, most adults need muscle-strengthening exercises on 2 or more days a week.  Maintain a healthy weight. The body mass index (BMI) is a screening tool to identify possible weight problems. It provides an estimate of body fat based on height and weight. Your health care provider can find your BMI and can help you achieve or maintain a healthy weight.For adults 20 years and older:  A BMI below 18.5 is considered underweight.  A BMI of 18.5 to 24.9 is normal.  A BMI of 25 to 29.9 is considered overweight.  A  BMI of 30 and above is considered obese.  Maintain normal blood lipids and cholesterol levels by exercising and minimizing your intake of saturated fat. Eat a balanced diet with plenty of fruit and vegetables. Blood tests for lipids and cholesterol should begin at age 45 and be repeated every 5 years. If your lipid or cholesterol levels are high, you are over 50, or you are at high risk for heart disease, you may need your cholesterol levels checked more frequently.Ongoing high lipid and cholesterol levels should be treated with medicines if diet and exercise are not working.  If you smoke, find out from your health care provider how to quit. If you do not use tobacco, do not start.  Lung cancer screening is recommended for adults aged 45-80 years who are at high risk for developing lung cancer because of a history of smoking. A yearly low-dose CT scan of the lungs is recommended for people who have at least a 30-pack-year history of smoking and are a current smoker or have quit within the past 15 years. A pack year of smoking is smoking an average of 1 pack of cigarettes a day for 1 year (for example: 1 pack a day for 30 years or 2 packs a day for 15 years). Yearly screening should continue until the smoker has stopped smoking for at least 15 years. Yearly screening should be stopped for people who develop a health problem that would prevent them from having lung cancer treatment.  If you are pregnant, do not drink alcohol. If you are  breastfeeding, be very cautious about drinking alcohol. If you are not pregnant and choose to drink alcohol, do not have more than 1 drink per day. One drink is considered to be 12 ounces (355 mL) of beer, 5 ounces (148 mL) of wine, or 1.5 ounces (44 mL) of liquor.  Avoid use of street drugs. Do not share needles with anyone. Ask for help if you need support or instructions about stopping the use of drugs.  High blood pressure causes heart disease and increases the risk  of stroke. Your blood pressure should be checked at least every 1 to 2 years. Ongoing high blood pressure should be treated with medicines if weight loss and exercise do not work.  If you are 55-79 years old, ask your health care provider if you should take aspirin to prevent strokes.  Diabetes screening is done by taking a blood sample to check your blood glucose level after you have not eaten for a certain period of time (fasting). If you are not overweight and you do not have risk factors for diabetes, you should be screened once every 3 years starting at age 45. If you are overweight or obese and you are 40-70 years of age, you should be screened for diabetes every year as part of your cardiovascular risk assessment.  Breast cancer screening is essential preventive care for women. You should practice "breast self-awareness." This means understanding the normal appearance and feel of your breasts and may include breast self-examination. Any changes detected, no matter how small, should be reported to a health care provider. Women in their 20s and 30s should have a clinical breast exam (CBE) by a health care provider as part of a regular health exam every 1 to 3 years. After age 40, women should have a CBE every year. Starting at age 40, women should consider having a mammogram (breast X-ray test) every year. Women who have a family history of breast cancer should talk to their health care provider about genetic screening. Women at a high risk of breast cancer should talk to their health care providers about having an MRI and a mammogram every year.  Breast cancer gene (BRCA)-related cancer risk assessment is recommended for women who have family members with BRCA-related cancers. BRCA-related cancers include breast, ovarian, tubal, and peritoneal cancers. Having family members with these cancers may be associated with an increased risk for harmful changes (mutations) in the breast cancer genes BRCA1 and  BRCA2. Results of the assessment will determine the need for genetic counseling and BRCA1 and BRCA2 testing.  Your health care provider may recommend that you be screened regularly for cancer of the pelvic organs (ovaries, uterus, and vagina). This screening involves a pelvic examination, including checking for microscopic changes to the surface of your cervix (Pap test). You may be encouraged to have this screening done every 3 years, beginning at age 21.  For women ages 30-65, health care providers may recommend pelvic exams and Pap testing every 3 years, or they may recommend the Pap and pelvic exam, combined with testing for human papilloma virus (HPV), every 5 years. Some types of HPV increase your risk of cervical cancer. Testing for HPV may also be done on women of any age with unclear Pap test results.  Other health care providers may not recommend any screening for nonpregnant women who are considered low risk for pelvic cancer and who do not have symptoms. Ask your health care provider if a screening pelvic exam is right for   you.  If you have had past treatment for cervical cancer or a condition that could lead to cancer, you need Pap tests and screening for cancer for at least 20 years after your treatment. If Pap tests have been discontinued, your risk factors (such as having a new sexual partner) need to be reassessed to determine if screening should resume. Some women have medical problems that increase the chance of getting cervical cancer. In these cases, your health care provider may recommend more frequent screening and Pap tests.  Colorectal cancer can be detected and often prevented. Most routine colorectal cancer screening begins at the age of 50 years and continues through age 75 years. However, your health care provider may recommend screening at an earlier age if you have risk factors for colon cancer. On a yearly basis, your health care provider may provide home test kits to check  for hidden blood in the stool. Use of a small camera at the end of a tube, to directly examine the colon (sigmoidoscopy or colonoscopy), can detect the earliest forms of colorectal cancer. Talk to your health care provider about this at age 50, when routine screening begins. Direct exam of the colon should be repeated every 5-10 years through age 75 years, unless early forms of precancerous polyps or small growths are found.  People who are at an increased risk for hepatitis B should be screened for this virus. You are considered at high risk for hepatitis B if:  You were born in a country where hepatitis B occurs often. Talk with your health care provider about which countries are considered high risk.  Your parents were born in a high-risk country and you have not received a shot to protect against hepatitis B (hepatitis B vaccine).  You have HIV or AIDS.  You use needles to inject street drugs.  You live with, or have sex with, someone who has hepatitis B.  You get hemodialysis treatment.  You take certain medicines for conditions like cancer, organ transplantation, and autoimmune conditions.  Hepatitis C blood testing is recommended for all people born from 1945 through 1965 and any individual with known risks for hepatitis C.  Practice safe sex. Use condoms and avoid high-risk sexual practices to reduce the spread of sexually transmitted infections (STIs). STIs include gonorrhea, chlamydia, syphilis, trichomonas, herpes, HPV, and human immunodeficiency virus (HIV). Herpes, HIV, and HPV are viral illnesses that have no cure. They can result in disability, cancer, and death.  You should be screened for sexually transmitted illnesses (STIs) including gonorrhea and chlamydia if:  You are sexually active and are younger than 24 years.  You are older than 24 years and your health care provider tells you that you are at risk for this type of infection.  Your sexual activity has changed  since you were last screened and you are at an increased risk for chlamydia or gonorrhea. Ask your health care provider if you are at risk.  If you are at risk of being infected with HIV, it is recommended that you take a prescription medicine daily to prevent HIV infection. This is called preexposure prophylaxis (PrEP). You are considered at risk if:  You are sexually active and do not regularly use condoms or know the HIV status of your partner(s).  You take drugs by injection.  You are sexually active with a partner who has HIV.  Talk with your health care provider about whether you are at high risk of being infected with HIV. If   you choose to begin PrEP, you should first be tested for HIV. You should then be tested every 3 months for as long as you are taking PrEP.  Osteoporosis is a disease in which the bones lose minerals and strength with aging. This can result in serious bone fractures or breaks. The risk of osteoporosis can be identified using a bone density scan. Women ages 67 years and over and women at risk for fractures or osteoporosis should discuss screening with their health care providers. Ask your health care provider whether you should take a calcium supplement or vitamin D to reduce the rate of osteoporosis.  Menopause can be associated with physical symptoms and risks. Hormone replacement therapy is available to decrease symptoms and risks. You should talk to your health care provider about whether hormone replacement therapy is right for you.  Use sunscreen. Apply sunscreen liberally and repeatedly throughout the day. You should seek shade when your shadow is shorter than you. Protect yourself by wearing long sleeves, pants, a wide-brimmed hat, and sunglasses year round, whenever you are outdoors.  Once a month, do a whole body skin exam, using a mirror to look at the skin on your back. Tell your health care provider of new moles, moles that have irregular borders, moles that  are larger than a pencil eraser, or moles that have changed in shape or color.  Stay current with required vaccines (immunizations).  Influenza vaccine. All adults should be immunized every year.  Tetanus, diphtheria, and acellular pertussis (Td, Tdap) vaccine. Pregnant women should receive 1 dose of Tdap vaccine during each pregnancy. The dose should be obtained regardless of the length of time since the last dose. Immunization is preferred during the 27th-36th week of gestation. An adult who has not previously received Tdap or who does not know her vaccine status should receive 1 dose of Tdap. This initial dose should be followed by tetanus and diphtheria toxoids (Td) booster doses every 10 years. Adults with an unknown or incomplete history of completing a 3-dose immunization series with Td-containing vaccines should begin or complete a primary immunization series including a Tdap dose. Adults should receive a Td booster every 10 years.  Varicella vaccine. An adult without evidence of immunity to varicella should receive 2 doses or a second dose if she has previously received 1 dose. Pregnant females who do not have evidence of immunity should receive the first dose after pregnancy. This first dose should be obtained before leaving the health care facility. The second dose should be obtained 4-8 weeks after the first dose.  Human papillomavirus (HPV) vaccine. Females aged 13-26 years who have not received the vaccine previously should obtain the 3-dose series. The vaccine is not recommended for use in pregnant females. However, pregnancy testing is not needed before receiving a dose. If a female is found to be pregnant after receiving a dose, no treatment is needed. In that case, the remaining doses should be delayed until after the pregnancy. Immunization is recommended for any person with an immunocompromised condition through the age of 61 years if she did not get any or all doses earlier. During the  3-dose series, the second dose should be obtained 4-8 weeks after the first dose. The third dose should be obtained 24 weeks after the first dose and 16 weeks after the second dose.  Zoster vaccine. One dose is recommended for adults aged 30 years or older unless certain conditions are present.  Measles, mumps, and rubella (MMR) vaccine. Adults born  before 1957 generally are considered immune to measles and mumps. Adults born in 1957 or later should have 1 or more doses of MMR vaccine unless there is a contraindication to the vaccine or there is laboratory evidence of immunity to each of the three diseases. A routine second dose of MMR vaccine should be obtained at least 28 days after the first dose for students attending postsecondary schools, health care workers, or international travelers. People who received inactivated measles vaccine or an unknown type of measles vaccine during 1963-1967 should receive 2 doses of MMR vaccine. People who received inactivated mumps vaccine or an unknown type of mumps vaccine before 1979 and are at high risk for mumps infection should consider immunization with 2 doses of MMR vaccine. For females of childbearing age, rubella immunity should be determined. If there is no evidence of immunity, females who are not pregnant should be vaccinated. If there is no evidence of immunity, females who are pregnant should delay immunization until after pregnancy. Unvaccinated health care workers born before 1957 who lack laboratory evidence of measles, mumps, or rubella immunity or laboratory confirmation of disease should consider measles and mumps immunization with 2 doses of MMR vaccine or rubella immunization with 1 dose of MMR vaccine.  Pneumococcal 13-valent conjugate (PCV13) vaccine. When indicated, a person who is uncertain of his immunization history and has no record of immunization should receive the PCV13 vaccine. All adults 65 years of age and older should receive this  vaccine. An adult aged 19 years or older who has certain medical conditions and has not been previously immunized should receive 1 dose of PCV13 vaccine. This PCV13 should be followed with a dose of pneumococcal polysaccharide (PPSV23) vaccine. Adults who are at high risk for pneumococcal disease should obtain the PPSV23 vaccine at least 8 weeks after the dose of PCV13 vaccine. Adults older than 62 years of age who have normal immune system function should obtain the PPSV23 vaccine dose at least 1 year after the dose of PCV13 vaccine.  Pneumococcal polysaccharide (PPSV23) vaccine. When PCV13 is also indicated, PCV13 should be obtained first. All adults aged 65 years and older should be immunized. An adult younger than age 65 years who has certain medical conditions should be immunized. Any person who resides in a nursing home or long-term care facility should be immunized. An adult smoker should be immunized. People with an immunocompromised condition and certain other conditions should receive both PCV13 and PPSV23 vaccines. People with human immunodeficiency virus (HIV) infection should be immunized as soon as possible after diagnosis. Immunization during chemotherapy or radiation therapy should be avoided. Routine use of PPSV23 vaccine is not recommended for American Indians, [place] Natives, or people younger than 65 years unless there are medical conditions that require PPSV23 vaccine. When indicated, people who have unknown immunization and have no record of immunization should receive PPSV23 vaccine. One-time revaccination 5 years after the first dose of PPSV23 is recommended for people aged 19-64 years who have chronic kidney failure, nephrotic syndrome, asplenia, or immunocompromised conditions. People who received 1-2 doses of PPSV23 before age 65 years should receive another dose of PPSV23 vaccine at age 65 years or later if at least 5 years have passed since the previous dose. Doses of PPSV23 are not  needed for people immunized with PPSV23 at or after age 65 years.  Meningococcal vaccine. Adults with asplenia or persistent complement component deficiencies should receive 2 doses of quadrivalent meningococcal conjugate (MenACWY-D) vaccine. The doses should be obtained   at least 2 months apart. Microbiologists working with certain meningococcal bacteria, [place] recruits, people at risk during an outbreak, and people who travel to or live in countries with a high rate of meningitis should be immunized. A first-year college student up through age 34 years who is living in a residence hall should receive a dose if she did not receive a dose on or after her 16th birthday. Adults who have certain high-risk conditions should receive one or more doses of vaccine.  Hepatitis A vaccine. Adults who wish to be protected from this disease, have certain high-risk conditions, work with hepatitis A-infected animals, work in hepatitis A research labs, or travel to or work in countries with a high rate of hepatitis A should be immunized. Adults who were previously unvaccinated and who anticipate close contact with an international adoptee during the first 60 days after arrival in the [place] from a country with a high rate of hepatitis A should be immunized.  Hepatitis B vaccine. Adults who wish to be protected from this disease, have certain high-risk conditions, may be exposed to blood or other infectious body fluids, are household contacts or sex partners of hepatitis B positive people, are clients or workers in certain care facilities, or travel to or work in countries with a high rate of hepatitis B should be immunized.  Haemophilus influenzae type b (Hib) vaccine. A previously unvaccinated person with asplenia or sickle cell disease or having a scheduled splenectomy should receive 1 dose of Hib vaccine. Regardless of previous immunization, a recipient of a hematopoietic stem cell transplant should receive a  3-dose series 6-12 months after her successful transplant. Hib vaccine is not recommended for adults with HIV infection. Preventive Services / Frequency Ages 35 to 4 years  Blood pressure check.** / Every 3-5 years.  Lipid and cholesterol check.** / Every 5 years beginning at age 60.  Clinical breast exam.** / Every 3 years for women in their 71s and 10s.  BRCA-related cancer risk assessment.** / For women who have family members with a BRCA-related cancer (breast, ovarian, tubal, or peritoneal cancers).  Pap test.** / Every 2 years from ages 76 through 26. Every 3 years starting at age 61 through age 76 or 93 with a history of 3 consecutive normal Pap tests.  HPV screening.** / Every 3 years from ages 37 through ages 60 to 51 with a history of 3 consecutive normal Pap tests.  Hepatitis C blood test.** / For any individual with known risks for hepatitis C.  Skin self-exam. / Monthly.  Influenza vaccine. / Every year.  Tetanus, diphtheria, and acellular pertussis (Tdap, Td) vaccine.** / Consult your health care provider. Pregnant women should receive 1 dose of Tdap vaccine during each pregnancy. 1 dose of Td every 10 years.  Varicella vaccine.** / Consult your health care provider. Pregnant females who do not have evidence of immunity should receive the first dose after pregnancy.  HPV vaccine. / 3 doses over 6 months, if 93 and younger. The vaccine is not recommended for use in pregnant females. However, pregnancy testing is not needed before receiving a dose.  Measles, mumps, rubella (MMR) vaccine.** / You need at least 1 dose of MMR if you were born in 1957 or later. You may also need a 2nd dose. For females of childbearing age, rubella immunity should be determined. If there is no evidence of immunity, females who are not pregnant should be vaccinated. If there is no evidence of immunity, females who are  pregnant should delay immunization until after pregnancy.  Pneumococcal  13-valent conjugate (PCV13) vaccine.** / Consult your health care provider.  Pneumococcal polysaccharide (PPSV23) vaccine.** / 1 to 2 doses if you smoke cigarettes or if you have certain conditions.  Meningococcal vaccine.** / 1 dose if you are age 68 to 8 years and a Market researcher living in a residence hall, or have one of several medical conditions, you need to get vaccinated against meningococcal disease. You may also need additional booster doses.  Hepatitis A vaccine.** / Consult your health care provider.  Hepatitis B vaccine.** / Consult your health care provider.  Haemophilus influenzae type b (Hib) vaccine.** / Consult your health care provider. Ages 7 to 53 years  Blood pressure check.** / Every year.  Lipid and cholesterol check.** / Every 5 years beginning at age 25 years.  Lung cancer screening. / Every year if you are aged 11-80 years and have a 30-pack-year history of smoking and currently smoke or have quit within the past 15 years. Yearly screening is stopped once you have quit smoking for at least 15 years or develop a health problem that would prevent you from having lung cancer treatment.  Clinical breast exam.** / Every year after age 48 years.  BRCA-related cancer risk assessment.** / For women who have family members with a BRCA-related cancer (breast, ovarian, tubal, or peritoneal cancers).  Mammogram.** / Every year beginning at age 41 years and continuing for as long as you are in good health. Consult with your health care provider.  Pap test.** / Every 3 years starting at age 65 years through age 37 or 70 years with a history of 3 consecutive normal Pap tests.  HPV screening.** / Every 3 years from ages 72 years through ages 60 to 40 years with a history of 3 consecutive normal Pap tests.  Fecal occult blood test (FOBT) of stool. / Every year beginning at age 21 years and continuing until age 5 years. You may not need to do this test if you get  a colonoscopy every 10 years.  Flexible sigmoidoscopy or colonoscopy.** / Every 5 years for a flexible sigmoidoscopy or every 10 years for a colonoscopy beginning at age 35 years and continuing until age 48 years.  Hepatitis C blood test.** / For all people born from [birth year] through 1965 and any individual with known risks for hepatitis C.  Skin self-exam. / Monthly.  Influenza vaccine. / Every year.  Tetanus, diphtheria, and acellular pertussis (Tdap/Td) vaccine.** / Consult your health care provider. Pregnant women should receive 1 dose of Tdap vaccine during each pregnancy. 1 dose of Td every 10 years.  Varicella vaccine.** / Consult your health care provider. Pregnant females who do not have evidence of immunity should receive the first dose after pregnancy.  Zoster vaccine.** / 1 dose for adults aged 30 years or older.  Measles, mumps, rubella (MMR) vaccine.** / You need at least 1 dose of MMR if you were born in 1957 or later. You may also need a second dose. For females of childbearing age, rubella immunity should be determined. If there is no evidence of immunity, females who are not pregnant should be vaccinated. If there is no evidence of immunity, females who are pregnant should delay immunization until after pregnancy.  Pneumococcal 13-valent conjugate (PCV13) vaccine.** / Consult your health care provider.  Pneumococcal polysaccharide (PPSV23) vaccine.** / 1 to 2 doses if you smoke cigarettes or if you have certain conditions.  Meningococcal vaccine.** /  Consult your health care provider.  Hepatitis A vaccine.** / Consult your health care provider.  Hepatitis B vaccine.** / Consult your health care provider.  Haemophilus influenzae type b (Hib) vaccine.** / Consult your health care provider. Ages 17 years and over  Blood pressure check.** / Every year.  Lipid and cholesterol check.** / Every 5 years beginning at age 85 years.  Lung cancer screening. / Every year if you  are aged 60-80 years and have a 30-pack-year history of smoking and currently smoke or have quit within the past 15 years. Yearly screening is stopped once you have quit smoking for at least 15 years or develop a health problem that would prevent you from having lung cancer treatment.  Clinical breast exam.** / Every year after age 65 years.  BRCA-related cancer risk assessment.** / For women who have family members with a BRCA-related cancer (breast, ovarian, tubal, or peritoneal cancers).  Mammogram.** / Every year beginning at age 9 years and continuing for as long as you are in good health. Consult with your health care provider.  Pap test.** / Every 3 years starting at age 31 years through age 11 or 47 years with 3 consecutive normal Pap tests. Testing can be stopped between 65 and 70 years with 3 consecutive normal Pap tests and no abnormal Pap or HPV tests in the past 10 years.  HPV screening.** / Every 3 years from ages 23 years through ages 87 or 60 years with a history of 3 consecutive normal Pap tests. Testing can be stopped between 65 and 70 years with 3 consecutive normal Pap tests and no abnormal Pap or HPV tests in the past 10 years.  Fecal occult blood test (FOBT) of stool. / Every year beginning at age 83 years and continuing until age 25 years. You may not need to do this test if you get a colonoscopy every 10 years.  Flexible sigmoidoscopy or colonoscopy.** / Every 5 years for a flexible sigmoidoscopy or every 10 years for a colonoscopy beginning at age 12 years and continuing until age 54 years.  Hepatitis C blood test.** / For all people born from [birth year] through 1967 and any individual with known risks for hepatitis C.  Osteoporosis screening.** / A one-time screening for women ages 42 years and over and women at risk for fractures or osteoporosis.  Skin self-exam. / Monthly.  Influenza vaccine. / Every year.  Tetanus, diphtheria, and acellular pertussis (Tdap/Td)  vaccine.** / 1 dose of Td every 10 years.  Varicella vaccine.** / Consult your health care provider.  Zoster vaccine.** / 1 dose for adults aged 74 years or older.  Pneumococcal 13-valent conjugate (PCV13) vaccine.** / Consult your health care provider.  Pneumococcal polysaccharide (PPSV23) vaccine.** / 1 dose for all adults aged 70 years and older.  Meningococcal vaccine.** / Consult your health care provider.  Hepatitis A vaccine.** / Consult your health care provider.  Hepatitis B vaccine.** / Consult your health care provider.  Haemophilus influenzae type b (Hib) vaccine.** / Consult your health care provider. ** Family history and personal history of risk and conditions may change your health care provider's recommendations.   This information is not intended to replace advice given to you by your health care provider. Make sure you discuss any questions you have with your health care provider.   Document Released: 05/14/2001 Document Revised: 04/08/2014 Document Reviewed: 08/13/2010 Elsevier Interactive Patient Education 2016 [employer]. Hypothyroidism Hypothyroidism is a disorder of the thyroid. The thyroid is a  large gland that is located in the lower front of the neck. The thyroid releases hormones that control how the body works. With hypothyroidism, the thyroid does not make enough of these hormones. CAUSES Causes of hypothyroidism may include:  Viral infections.  Pregnancy.  Your own defense system (immune system) attacking your thyroid.  Certain medicines.  Birth defects.  Past radiation treatments to your head or neck.  Past treatment with radioactive iodine.  Past surgical removal of part or all of your thyroid.  Problems with the gland that is located in the center of your brain (pituitary). SIGNS AND SYMPTOMS Signs and symptoms of hypothyroidism may include:  Feeling as though you have no energy (lethargy).  Inability to tolerate cold.  Weight  gain that is not explained by a change in diet or exercise habits.  Dry skin.  Coarse hair.  Menstrual irregularity.  Slowing of thought processes.  Constipation.  Sadness or depression. DIAGNOSIS  Your health care provider may diagnose hypothyroidism with blood tests and ultrasound tests. TREATMENT Hypothyroidism is treated with medicine that replaces the hormones that your body does not make. After you begin treatment, it may take several weeks for symptoms to go away. HOME CARE INSTRUCTIONS   Take medicines only as directed by your health care provider.  If you start taking any new medicines, tell your health care provider.  Keep all follow-up visits as directed by your health care provider. This is important. As your condition improves, your dosage needs may change. You will need to have blood tests regularly so that your health care provider can watch your condition. SEEK MEDICAL CARE IF:  Your symptoms do not get better with treatment.  You are taking thyroid replacement medicine and:  You sweat excessively.  You have tremors.  You feel anxious.  You lose weight rapidly.  You cannot tolerate heat.  You have emotional swings.  You have diarrhea.  You feel weak. SEEK IMMEDIATE MEDICAL CARE IF:   You develop chest pain.  You develop an irregular heartbeat.  You develop a rapid heartbeat.   This information is not intended to replace advice given to you by your health care provider. Make sure you discuss any questions you have with your health care provider.   Document Released: 03/18/2005 Document Revised: 04/08/2014 Document Reviewed: 08/03/2013 Elsevier Interactive Patient Education [employer].

## 2015-09-11 NOTE — Progress Notes (Signed)
Subjective:     Diane Hodges is a 60 y.o. female and is here for a comprehensive physical exam. The patient reports no problems.  Social History   Social History  . Marital Status: Married    Spouse Name: N/A  . Number of Children: N/A  . 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 Smoking status: Never Smoker   . Smokeless tobacco: Never Used  . Alcohol Use: No  . Drug Use: No  . Sexual Activity:    Partners: Male   Other Topics Concern  . Not on file   Social History Narrative   Exercise-- none   Health Maintenance  Topic Date Due  . ZOSTAVAX  03/09/2014  . INFLUENZA VACCINE  06/28/2016 (Originally 10/31/2015)  . COLONOSCOPY  12/17/2015  . MAMMOGRAM  04/09/2016  . PAP SMEAR  04/20/2017  . TETANUS/TDAP  11/13/2021  . Hepatitis C Screening  Completed  . HIV Screening  Completed    The following portions of the patient's history were reviewed and updated as appropriate:  She  has a past medical history of Thyroid disease and Hypertension. She  does not have any pertinent problems on file. She  has past surgical history that includes Oophorectomy (1980) and Cataract extraction (Right, 2015). Her family history includes Hypertension in her father and mother. She  reports that she has never smoked. She has never used smokeless tobacco. She reports that she does not drink alcohol or use illicit drugs. She has a current medication list which includes the following prescription(s): fenofibrate micronized, glucose blood, levothyroxine, nebivolol, fish oil, onetouch delica lancets fine, and vitamin e. Current Outpatient Prescriptions on File Prior to Visit  Medication Sig Dispense Refill  . glucose blood test strip Onetouch verio test strips. Check blood sugar once a day 100 each 0  . ONETOUCH DELICA LANCETS FINE MISC 1 Device by Does not apply route daily. 100 each 0   No current  facility-administered medications on file prior to visit.   She is allergic to azithromycin..  Review of Systems Review of Systems  Constitutional: Negative for activity change, appetite change and fatigue.  HENT: Negative for hearing loss, congestion, tinnitus and ear discharge.  dentist q69m Eyes: Negative for visual disturbance (see optho q1y -- vision corrected to 20/20 with glasses).  Respiratory: Negative for cough, chest tightness and shortness of breath.   Cardiovascular: Negative for chest pain, palpitations and leg swelling.  Gastrointestinal: Negative for abdominal pain, diarrhea, constipation and abdominal distention.  Genitourinary: Negative for urgency, frequency, decreased urine volume and difficulty urinating.  Musculoskeletal: Negative for back pain, arthralgias and gait problem.  Skin: Negative for color change, pallor and rash.  Neurological: Negative for dizziness, light-headedness, numbness and headaches.  Hematological: Negative for adenopathy. Does not bruise/bleed easily.  Psychiatric/Behavioral: Negative for suicidal ideas, confusion, sleep disturbance, self-injury, dysphoric mood, decreased concentration and agitation.       Objective:    BP 114/70 mmHg  Pulse 70  Temp(Src) 98.1 F (36.7 C) (Oral)  Ht 5\' 6"  (1.676 m)  Wt 210 lb 6.4 oz (95.437 kg)  BMI 33.98 kg/m2  SpO2 97% General appearance: alert, cooperative, appears stated age and no distress Head: Normocephalic, without obvious abnormality, atraumatic Eyes: negative findings: lids and lashes normal, conjunctivae and sclerae normal and pupils equal, round, reactive to light and accomodation Ears: normal TM's and external ear canals both ears Nose: Nares normal.  Septum midline. Mucosa normal. No drainage or sinus tenderness. Throat: lips, mucosa, and tongue normal; teeth and gums normal Neck: no adenopathy, no carotid bruit, no JVD, supple, symmetrical, trachea midline and thyroid not enlarged,  symmetric, no tenderness/mass/nodules Back: symmetric, no curvature. ROM normal. No CVA tenderness. Lungs: clear to auscultation bilaterally Breasts: gyn Heart: regular rate and rhythm, S1, S2 normal, no murmur, click, rub or gallop Abdomen: soft, non-tender; bowel sounds normal; no masses,  no organomegaly Pelvic: deferred-gyn Extremities: extremities normal, atraumatic, no cyanosis or edema Pulses: 2+ and symmetric Skin: Skin color, texture, turgor normal. No rashes or lesions Lymph nodes: Cervical, supraclavicular, and axillary nodes normal. Neurologic: Alert and oriented X 3, normal strength and tone. Normal symmetric reflexes. Normal coordination and gait    Assessment:    Healthy female exam.      Plan:     ghm utd Check labs See After Visit Summary for Counseling Recommendations    1. Essential hypertension con't bystolic - POCT urinalysis dipstick - CBC with Differential/Platelet - Comprehensive metabolic panel  2. Hyperlipemia   - Lipid panel - Comprehensive metabolic panel  3. Hyperglycemia   - Hemoglobin A1c  4. Hypothyroidism, unspecified hypothyroidism type   - TSH - levothyroxine (SYNTHROID, LEVOTHROID) 100 MCG tablet; Take 1 tablet (100 mcg total) by mouth daily.  Dispense: 90 tablet; Refill: 3  5. Preventative health care  See above

## 2015-09-11 NOTE — Progress Notes (Signed)
Pre visit review using our clinic review tool, if applicable. No additional management support is needed unless otherwise documented below in the visit note. 

## 2015-09-20 ENCOUNTER — Encounter: Payer: Self-pay | Admitting: *Deleted

## 2015-09-22 ENCOUNTER — Other Ambulatory Visit: Payer: Self-pay | Admitting: *Deleted

## 2015-09-22 DIAGNOSIS — E785 Hyperlipidemia, unspecified: Secondary | ICD-10-CM

## 2015-10-18 ENCOUNTER — Other Ambulatory Visit: Payer: Self-pay | Admitting: Family Medicine

## 2015-10-23 ENCOUNTER — Encounter: Payer: Self-pay | Admitting: Gastroenterology

## 2015-12-05 DIAGNOSIS — H2512 Age-related nuclear cataract, left eye: Secondary | ICD-10-CM | POA: Diagnosis not present

## 2015-12-05 DIAGNOSIS — H25812 Combined forms of age-related cataract, left eye: Secondary | ICD-10-CM | POA: Diagnosis not present

## 2015-12-05 DIAGNOSIS — Z961 Presence of intraocular lens: Secondary | ICD-10-CM | POA: Diagnosis not present

## 2016-02-15 ENCOUNTER — Other Ambulatory Visit: Payer: Self-pay | Admitting: Family Medicine

## 2016-02-15 DIAGNOSIS — I10 Essential (primary) hypertension: Secondary | ICD-10-CM

## 2016-02-19 ENCOUNTER — Telehealth: Payer: Self-pay | Admitting: Family Medicine

## 2016-02-19 DIAGNOSIS — E781 Pure hyperglyceridemia: Secondary | ICD-10-CM

## 2016-02-19 NOTE — Telephone Encounter (Signed)
Caller name: Marjona Relationship to patient: self Can be reached: [phone] Pharmacy:[id number] - [place], [place]  Reason for call: pt scheduled f/u appt for Jan 16 and will need 3 month supply of meds sent in til then (3 meds nebivolol, levothyroxine, and fenofibrate).

## 2016-02-20 ENCOUNTER — Other Ambulatory Visit: Payer: Self-pay

## 2016-02-20 DIAGNOSIS — E781 Pure hyperglyceridemia: Secondary | ICD-10-CM

## 2016-02-20 DIAGNOSIS — I10 Essential (primary) hypertension: Secondary | ICD-10-CM

## 2016-02-20 MED ORDER — FENOFIBRATE MICRONIZED 90 MG PO CAPS: 1.0000 | ORAL_CAPSULE | Freq: Every day | ORAL | 1 refills | Status: DC

## 2016-02-20 MED ORDER — NEBIVOLOL HCL 10 MG PO TABS: ORAL_TABLET | ORAL | 1 refills | Status: DC

## 2016-02-20 MED ORDER — LEVOTHYROXINE SODIUM 100 MCG PO TABS: 100.0000 ug | ORAL_TABLET | Freq: Every day | ORAL | 2 refills | Status: DC

## 2016-02-20 NOTE — Telephone Encounter (Signed)
Left message pt's vm informing her that request to fill 3 Rx has [facility] to [facility], and she can give [place] a call if she has any questions. LB

## 2016-02-20 NOTE — Progress Notes (Signed)
Left message pt's vm informing her that request to fill 3 Rx [facility] to [facility], and she can give [place] a call if she has any questions. LB

## 2016-03-04 DIAGNOSIS — H35362 Drusen (degenerative) of macula, left eye: Secondary | ICD-10-CM | POA: Diagnosis not present

## 2016-03-04 DIAGNOSIS — H35033 Hypertensive retinopathy, bilateral: Secondary | ICD-10-CM | POA: Diagnosis not present

## 2016-03-04 DIAGNOSIS — H2512 Age-related nuclear cataract, left eye: Secondary | ICD-10-CM | POA: Diagnosis not present

## 2016-03-04 DIAGNOSIS — Z961 Presence of intraocular lens: Secondary | ICD-10-CM | POA: Diagnosis not present

## 2016-04-16 ENCOUNTER — Ambulatory Visit: Payer: BLUE CROSS/BLUE SHIELD | Admitting: Family Medicine

## 2016-04-16 DIAGNOSIS — Z961 Presence of intraocular lens: Secondary | ICD-10-CM | POA: Diagnosis not present

## 2016-04-16 DIAGNOSIS — H25012 Cortical age-related cataract, left eye: Secondary | ICD-10-CM | POA: Diagnosis not present

## 2016-04-16 DIAGNOSIS — H2511 Age-related nuclear cataract, right eye: Secondary | ICD-10-CM | POA: Diagnosis not present

## 2016-04-16 DIAGNOSIS — H2513 Age-related nuclear cataract, bilateral: Secondary | ICD-10-CM | POA: Diagnosis not present

## 2016-04-16 DIAGNOSIS — H2512 Age-related nuclear cataract, left eye: Secondary | ICD-10-CM | POA: Diagnosis not present

## 2016-05-07 ENCOUNTER — Ambulatory Visit (INDEPENDENT_AMBULATORY_CARE_PROVIDER_SITE_OTHER): Payer: BLUE CROSS/BLUE SHIELD | Admitting: Family Medicine

## 2016-05-07 ENCOUNTER — Encounter: Payer: Self-pay | Admitting: Family Medicine

## 2016-05-07 VITALS — BP 126/68 | HR 64 | Temp 98.1°F | Resp 16 | Ht 65.0 in | Wt 213.0 lb

## 2016-05-07 DIAGNOSIS — E039 Hypothyroidism, unspecified: Secondary | ICD-10-CM | POA: Diagnosis not present

## 2016-05-07 DIAGNOSIS — R739 Hyperglycemia, unspecified: Secondary | ICD-10-CM

## 2016-05-07 DIAGNOSIS — I10 Essential (primary) hypertension: Secondary | ICD-10-CM | POA: Diagnosis not present

## 2016-05-07 DIAGNOSIS — E781 Pure hyperglyceridemia: Secondary | ICD-10-CM

## 2016-05-07 DIAGNOSIS — E785 Hyperlipidemia, unspecified: Secondary | ICD-10-CM

## 2016-05-07 LAB — COMPREHENSIVE METABOLIC PANEL
ALT: 16 U/L (ref 0–35)
AST: 16 U/L (ref 0–37)
Albumin: 4.6 g/dL (ref 3.5–5.2)
Alkaline Phosphatase: 42 U/L (ref 39–117)
BUN: 11 mg/dL (ref 6–23)
CHLORIDE: 105 meq/L (ref 96–112)
CO2: 30 meq/L (ref 19–32)
CREATININE: 0.76 mg/dL (ref 0.40–1.20)
Calcium: 9.3 mg/dL (ref 8.4–10.5)
GFR: 81.92 mL/min (ref >60.00)
Glucose, Bld: 107 mg/dL — ABNORMAL HIGH (ref 70–99)
POTASSIUM: 4 meq/L (ref 3.5–5.1)
SODIUM: 140 meq/L (ref 135–145)
Total Bilirubin: 0.5 mg/dL (ref 0.2–1.2)
Total Protein: 7.3 g/dL (ref 6.0–8.3)

## 2016-05-07 LAB — HEMOGLOBIN A1C: Hgb A1c MFr Bld: 6.6 % — ABNORMAL HIGH (ref 4.6–6.5)

## 2016-05-07 LAB — LIPID PANEL
CHOL/HDL RATIO: 3
CHOLESTEROL: 151 mg/dL (ref 0–200)
HDL: 45.9 mg/dL (ref >39.00)
LDL CALC: 80 mg/dL (ref 0–99)
NonHDL: 104.81
TRIGLYCERIDES: 126 mg/dL (ref 0.0–149.0)
VLDL: 25.2 mg/dL (ref 0.0–40.0)

## 2016-05-07 LAB — TSH: TSH: 2.27 u[IU]/mL (ref 0.35–4.50)

## 2016-05-07 MED ORDER — NEBIVOLOL HCL 10 MG PO TABS: ORAL_TABLET | ORAL | 1 refills | Status: DC

## 2016-05-07 MED ORDER — FENOFIBRATE MICRONIZED 90 MG PO CAPS: 1.0000 | ORAL_CAPSULE | Freq: Every day | ORAL | 1 refills | Status: DC

## 2016-05-07 NOTE — Progress Notes (Signed)
Subjective:  I acted as a Education administrator for Dr. Carollee Herter.  [name], [place]   Patient ID: Diane Hodges, female    DOB: [birth year], 61 y.o.   MRN: [id number]  Chief Complaint  Patient presents with  . Follow-up  . Hypertension  . Hypothyroidism  . Hyperlipidemia    HPI Patient is in today for follow up blood pressure, thyroid, and cholesterol.  No other complaints   Past Medical History:  Diagnosis Date  . Hypertension   . Thyroid disease    Hypothyridism    Past Surgical History:  Procedure Laterality Date  . CATARACT EXTRACTION Right 2015  . OOPHORECTOMY  1980    Family History  Problem Relation Age of Onset  . Thyroid disease    . Hypertension Mother   . Hypertension Father     Social History   Social History  . Marital status: Married    Spouse name: N/A  . Number of children: N/A  . 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 Smoking status: Never Smoker  . Smokeless tobacco: Never Used  . Alcohol use No  . Drug use: No  . Sexual activity: Yes    Partners: Male   Other Topics Concern  . Not on file   Social History Narrative   Exercise-- none    Outpatient Medications Prior to Visit  Medication Sig Dispense Refill  . glucose blood test strip Onetouch verio test strips. Check blood sugar once a day 100 each 0  . Omega-3 Fatty Acids (FISH OIL) 1200 MG CAPS Take 1 capsule by mouth daily.    [name] DELICA LANCETS FINE MISC 1 Device by Does not apply route daily. 100 each 0  . vitamin E 400 UNIT capsule Take 400 Units by mouth daily.    [name] 90 MG CAPS TAKE 1 CAPSULE BY MOUTH DAILY. 90 capsule 1  . BYSTOLIC 10 MG tablet TAKE 1 TABLET (10 MG TOTAL) BY MOUTH DAILY. 90 tablet 1  . Fenofibrate Micronized (ANTARA) 90 MG CAPS Take 1 capsule by mouth daily. 90 capsule 1  . levothyroxine (SYNTHROID, LEVOTHROID) 100 MCG tablet Take 1 tablet (100 mcg total) by mouth daily. 90  tablet 3  . levothyroxine (SYNTHROID, LEVOTHROID) 100 MCG tablet Take 1 tablet (100 mcg total) by mouth daily. 90 tablet 2  . nebivolol (BYSTOLIC) 10 MG tablet TAKE 1 TABLET (10 MG TOTAL) BY MOUTH DAILY. 90 tablet 1   No facility-administered medications prior to visit.     Allergies  Allergen Reactions  . Azithromycin     REACTION: rash    Review of Systems  Constitutional: Negative for fever and malaise/fatigue.  HENT: Negative for congestion.   Eyes: Negative for blurred vision.  Respiratory: Negative for cough and shortness of breath.   Cardiovascular: Negative for chest pain, palpitations and leg swelling.  Gastrointestinal: Negative for vomiting.  Musculoskeletal: Negative for back pain.  Skin: Negative for rash.  Neurological: Negative for loss of consciousness and headaches.       Objective:    Physical Exam  Constitutional: She is oriented to person, place, and time. She appears well-developed and well-nourished. No distress.  HENT:  Head: Normocephalic and atraumatic.  Eyes: Conjunctivae are normal.  Neck: Normal range of motion. No thyromegaly present.  Cardiovascular: Normal rate and regular rhythm.   Pulmonary/Chest: Effort normal and breath sounds normal. She has no wheezes.  Abdominal:  Soft. Bowel sounds are normal. There is no tenderness.  Musculoskeletal: Normal range of motion. She exhibits no edema or deformity.  Neurological: She is alert and oriented to person, place, and time.  Skin: Skin is warm and dry. She is not diaphoretic.  Psychiatric: She has a normal mood and affect.    BP 126/68 (BP Location: Left Arm, Cuff Size: Large)   Pulse 64   Temp 98.1 F (36.7 C) (Oral)   Resp 16   Ht 5\' 5"  (1.651 m)   Wt 213 lb (96.6 kg)   SpO2 97% Comment: RA  BMI 35.45 kg/m  Wt Readings from Last 3 Encounters:  05/07/16 213 lb (96.6 kg)  09/11/15 210 lb 6.4 oz (95.4 kg)  06/29/15 208 lb 3.2 oz (94.4 kg)     Lab Results  Component Value Date   WBC  7.2 09/11/2015   HGB 13.5 09/11/2015   HCT 40.1 09/11/2015   PLT 206.0 09/11/2015   GLUCOSE 107 (H) 05/07/2016   CHOL 151 05/07/2016   TRIG 126.0 05/07/2016   HDL 45.90 05/07/2016   LDLDIRECT 98.1 02/17/2009   LDLCALC 80 05/07/2016   ALT 16 05/07/2016   AST 16 05/07/2016   NA 140 05/07/2016   K 4.0 05/07/2016   CL 105 05/07/2016   CREATININE 0.76 05/07/2016   BUN 11 05/07/2016   CO2 30 05/07/2016   TSH 2.27 05/07/2016   HGBA1C 6.6 (H) 05/07/2016   MICROALBUR 0.4 02/19/2013    Lab Results  Component Value Date   TSH 2.27 05/07/2016   Lab Results  Component Value Date   WBC 7.2 09/11/2015   HGB 13.5 09/11/2015   HCT 40.1 09/11/2015   MCV 92.1 09/11/2015   PLT 206.0 09/11/2015   Lab Results  Component Value Date   NA 140 05/07/2016   K 4.0 05/07/2016   CO2 30 05/07/2016   GLUCOSE 107 (H) 05/07/2016   BUN 11 05/07/2016   CREATININE 0.76 05/07/2016   BILITOT 0.5 05/07/2016   ALKPHOS 42 05/07/2016   AST 16 05/07/2016   ALT 16 05/07/2016   PROT 7.3 05/07/2016   ALBUMIN 4.6 05/07/2016   CALCIUM 9.3 05/07/2016   GFR 81.92 05/07/2016   Lab Results  Component Value Date   CHOL 151 05/07/2016   Lab Results  Component Value Date   HDL 45.90 05/07/2016   Lab Results  Component Value Date   LDLCALC 80 05/07/2016   Lab Results  Component Value Date   TRIG 126.0 05/07/2016   Lab Results  Component Value Date   CHOLHDL 3 05/07/2016   Lab Results  Component Value Date   HGBA1C 6.6 (H) 05/07/2016       Assessment & Plan:   Problem List Items Addressed This Visit      Unprioritized   Essential hypertension - Primary    Stable con't meds      Relevant Medications   Fenofibrate Micronized (ANTARA) 90 MG CAPS   nebivolol (BYSTOLIC) 10 MG tablet   Other Relevant Orders   Comprehensive metabolic panel (Completed)   Hyperlipidemia    Check labs Con't meds      Relevant Medications   Fenofibrate Micronized (ANTARA) 90 MG CAPS   nebivolol  (BYSTOLIC) 10 MG tablet   Other Relevant Orders   Lipid panel (Completed)   Hypothyroidism    Check labs con't meds      Relevant Medications   levothyroxine (SYNTHROID, LEVOTHROID) 100 MCG tablet   nebivolol (BYSTOLIC) 10 MG tablet  Other Relevant Orders   TSH (Completed)    Other Visit Diagnoses    Hyperglycemia       Relevant Orders   Hemoglobin A1c (Completed)   Hypertriglyceridemia       Relevant Medications   Fenofibrate Micronized (ANTARA) 90 MG CAPS   nebivolol (BYSTOLIC) 10 MG tablet      I have discontinued Ms. Smucker's BYSTOLIC and ANTARA. I am also having her maintain her glucose blood, ONETOUCH DELICA LANCETS FINE, vitamin E, Fish Oil, triamcinolone cream, Fenofibrate Micronized, levothyroxine, and nebivolol.  Meds ordered this encounter  Medications  . triamcinolone cream (KENALOG) 0.1 %    Sig: APPLY TO AFFECTED AREA EVERY DAY    Refill:  4  . DISCONTD: nebivolol (BYSTOLIC) 10 MG tablet    Sig: TAKE 1 TABLET (10 MG TOTAL) BY MOUTH DAILY.    Dispense:  90 tablet    Refill:  1  . DISCONTD: Fenofibrate Micronized (ANTARA) 90 MG CAPS    Sig: Take 1 capsule by mouth daily.    Dispense:  90 capsule    Refill:  1    X  . Fenofibrate Micronized (ANTARA) 90 MG CAPS    Sig: Take 1 capsule by mouth daily.    Dispense:  90 capsule    Refill:  1    X  . levothyroxine (SYNTHROID, LEVOTHROID) 100 MCG tablet    Sig: Take 1 tablet (100 mcg total) by mouth daily.    Dispense:  90 tablet    Refill:  3  . nebivolol (BYSTOLIC) 10 MG tablet    Sig: TAKE 1 TABLET (10 MG TOTAL) BY MOUTH DAILY.    Dispense:  90 tablet    Refill:  1   CMA served as Education administrator during this visit. History, Physical and Plan performed by medical provider. Documentation and orders reviewed and attested to.  [name], DO

## 2016-05-07 NOTE — Progress Notes (Signed)
Pre visit review using our clinic review tool, if applicable. No additional management support is needed unless otherwise documented below in the visit note. 

## 2016-05-07 NOTE — Patient Instructions (Signed)
Make sure you schedule your mammogram.   Think about new shingles vaccine Shingrix.  Call us if decide to get it and we can set you up for a nurse visit only.      Cholesterol Cholesterol is a white, waxy, fat-like substance that is needed by the human body in small amounts. The liver makes all the cholesterol we need. Cholesterol is carried from the liver by the blood through the blood vessels. Deposits of cholesterol (plaques) may build up on blood vessel (artery) walls. Plaques make the arteries narrower and stiffer. Cholesterol plaques increase the risk for heart attack and stroke. You cannot feel your cholesterol level even if it is very high. The only way to know that it is high is to have a blood test. Once you know your cholesterol levels, you should keep a record of the test results. Work with your health care provider to keep your levels in the desired range. What do the results mean?  Total cholesterol is a rough measure of all the cholesterol in your blood.  LDL (low-density lipoprotein) is the "bad" cholesterol. This is the type that causes plaque to build up on the artery walls. You want this level to be low.  HDL (high-density lipoprotein) is the "good" cholesterol because it cleans the arteries and carries the LDL away. You want this level to be high.  Triglycerides are fat that the body can either burn for energy or store. High levels are closely linked to heart disease. What are the desired levels of cholesterol?  Total cholesterol below 200.  LDL below 100 for people who are at risk, below 70 for people at very high risk.  HDL above 40 is good. A level of 60 or higher is considered to be protective against heart disease.  Triglycerides below 150. How can I lower my cholesterol? Diet  Follow your diet program as told by your health care provider.  Choose fish or white meat chicken and [place], roasted or baked. Limit fatty cuts of red meat, fried foods, and processed  meats, such as sausage and lunch meats.  Eat lots of fresh fruits and vegetables.  Choose whole grains, beans, pasta, potatoes, and cereals.  Choose olive oil, corn oil, or canola oil, and use only small amounts.  Avoid butter, mayonnaise, shortening, or palm kernel oils.  Avoid foods with trans fats.  Drink skim or nonfat milk and eat low-fat or nonfat yogurt and cheeses. Avoid whole milk, cream, ice cream, egg yolks, and full-fat cheeses.  Healthier desserts include angel food cake, ginger snaps, animal crackers, hard candy, popsicles, and low-fat or nonfat frozen yogurt. Avoid pastries, cakes, pies, and cookies. Exercise  Follow your exercise program as told by your health care provider. A regular program:  Helps to decrease LDL and raise HDL.  Helps with weight control.  Do things that increase your activity level, such as gardening, walking, and taking the stairs.  Ask your health care provider about ways that you can be more active in your daily life. Medicine  Take over-the-counter and prescription medicines only as told by your health care provider.  Medicine may be prescribed by your health care provider to help lower cholesterol and decrease the risk for heart disease. This is usually done if diet and exercise have failed to bring down cholesterol levels.  If you have several risk factors, you may need medicine even if your levels are normal. This information is not intended to replace advice given to you by  your health care provider. Make sure you discuss any questions you have with your health care provider. Document Released: 12/11/2000 Document Revised: 10/14/2015 Document Reviewed: 09/16/2015 Elsevier Interactive Patient Education  2017 [employer].

## 2016-05-08 MED ORDER — LEVOTHYROXINE SODIUM 100 MCG PO TABS: 100.0000 ug | ORAL_TABLET | Freq: Every day | ORAL | 3 refills | Status: DC

## 2016-05-08 MED ORDER — NEBIVOLOL HCL 10 MG PO TABS: ORAL_TABLET | ORAL | 1 refills | Status: DC

## 2016-05-08 MED ORDER — FENOFIBRATE MICRONIZED 90 MG PO CAPS: 1.0000 | ORAL_CAPSULE | Freq: Every day | ORAL | 1 refills | Status: DC

## 2016-05-08 NOTE — Assessment & Plan Note (Signed)
Stable con't meds 

## 2016-05-08 NOTE — Assessment & Plan Note (Signed)
Check labs con't meds 

## 2016-05-08 NOTE — Assessment & Plan Note (Signed)
Check labs Cont' meds 

## 2016-05-13 ENCOUNTER — Other Ambulatory Visit: Payer: Self-pay | Admitting: Family Medicine

## 2016-05-13 DIAGNOSIS — E785 Hyperlipidemia, unspecified: Secondary | ICD-10-CM

## 2016-05-13 DIAGNOSIS — E1165 Type 2 diabetes mellitus with hyperglycemia: Secondary | ICD-10-CM

## 2016-07-11 ENCOUNTER — Other Ambulatory Visit: Payer: Self-pay | Admitting: Family Medicine

## 2016-08-09 DIAGNOSIS — M25561 Pain in right knee: Secondary | ICD-10-CM | POA: Diagnosis not present

## 2016-08-13 ENCOUNTER — Other Ambulatory Visit (INDEPENDENT_AMBULATORY_CARE_PROVIDER_SITE_OTHER): Payer: BLUE CROSS/BLUE SHIELD

## 2016-08-13 DIAGNOSIS — E1165 Type 2 diabetes mellitus with hyperglycemia: Secondary | ICD-10-CM

## 2016-08-13 DIAGNOSIS — E785 Hyperlipidemia, unspecified: Secondary | ICD-10-CM | POA: Diagnosis not present

## 2016-08-13 LAB — COMPREHENSIVE METABOLIC PANEL
ALBUMIN: 4.7 g/dL (ref 3.5–5.2)
ALK PHOS: 43 U/L (ref 39–117)
ALT: 16 U/L (ref 0–35)
AST: 17 U/L (ref 0–37)
BILIRUBIN TOTAL: 0.5 mg/dL (ref 0.2–1.2)
BUN: 11 mg/dL (ref 6–23)
CO2: 30 mEq/L (ref 19–32)
Calcium: 9.5 mg/dL (ref 8.4–10.5)
Chloride: 104 mEq/L (ref 96–112)
Creatinine, Ser: 0.85 mg/dL (ref 0.40–1.20)
GFR: 71.93 mL/min (ref >60.00)
GLUCOSE: 144 mg/dL — AB (ref 70–99)
Potassium: 4.1 mEq/L (ref 3.5–5.1)
Sodium: 139 mEq/L (ref 135–145)
TOTAL PROTEIN: 7.3 g/dL (ref 6.0–8.3)

## 2016-08-13 LAB — LIPID PANEL
CHOLESTEROL: 156 mg/dL (ref 0–200)
HDL: 46.3 mg/dL (ref >39.00)
LDL Cholesterol: 81 mg/dL (ref 0–99)
NONHDL: 109.7
Total CHOL/HDL Ratio: 3
Triglycerides: 144 mg/dL (ref 0.0–149.0)
VLDL: 28.8 mg/dL (ref 0.0–40.0)

## 2016-08-13 LAB — HEMOGLOBIN A1C: HEMOGLOBIN A1C: 6.7 % — AB (ref 4.6–6.5)

## 2016-08-16 ENCOUNTER — Other Ambulatory Visit: Payer: Self-pay | Admitting: Family Medicine

## 2016-08-16 DIAGNOSIS — E119 Type 2 diabetes mellitus without complications: Secondary | ICD-10-CM

## 2016-08-16 DIAGNOSIS — E785 Hyperlipidemia, unspecified: Secondary | ICD-10-CM

## 2016-09-05 DIAGNOSIS — D229 Melanocytic nevi, unspecified: Secondary | ICD-10-CM | POA: Diagnosis not present

## 2016-09-05 DIAGNOSIS — L409 Psoriasis, unspecified: Secondary | ICD-10-CM | POA: Diagnosis not present

## 2016-11-15 ENCOUNTER — Other Ambulatory Visit: Payer: BLUE CROSS/BLUE SHIELD

## 2016-11-18 DIAGNOSIS — M25561 Pain in right knee: Secondary | ICD-10-CM | POA: Diagnosis not present

## 2016-11-19 ENCOUNTER — Other Ambulatory Visit: Payer: Self-pay | Admitting: Family Medicine

## 2016-11-19 DIAGNOSIS — E781 Pure hyperglyceridemia: Secondary | ICD-10-CM

## 2016-11-19 NOTE — Telephone Encounter (Signed)
Sent rx to pharmacy. LB 

## 2016-11-20 ENCOUNTER — Other Ambulatory Visit (INDEPENDENT_AMBULATORY_CARE_PROVIDER_SITE_OTHER): Payer: BLUE CROSS/BLUE SHIELD

## 2016-11-20 DIAGNOSIS — E119 Type 2 diabetes mellitus without complications: Secondary | ICD-10-CM | POA: Diagnosis not present

## 2016-11-20 DIAGNOSIS — E785 Hyperlipidemia, unspecified: Secondary | ICD-10-CM | POA: Diagnosis not present

## 2016-11-20 LAB — COMPREHENSIVE METABOLIC PANEL
ALT: 16 U/L (ref 0–35)
AST: 19 U/L (ref 0–37)
Albumin: 4.5 g/dL (ref 3.5–5.2)
Alkaline Phosphatase: 42 U/L (ref 39–117)
BILIRUBIN TOTAL: 0.3 mg/dL (ref 0.2–1.2)
BUN: 17 mg/dL (ref 6–23)
CHLORIDE: 104 meq/L (ref 96–112)
CO2: 30 meq/L (ref 19–32)
Calcium: 9.4 mg/dL (ref 8.4–10.5)
Creatinine, Ser: 0.94 mg/dL (ref 0.40–1.20)
GFR: 63.99 mL/min (ref >60.00)
GLUCOSE: 123 mg/dL — AB (ref 70–99)
POTASSIUM: 3.8 meq/L (ref 3.5–5.1)
Sodium: 141 mEq/L (ref 135–145)
Total Protein: 7.2 g/dL (ref 6.0–8.3)

## 2016-11-20 LAB — LIPID PANEL
Cholesterol: 151 mg/dL (ref 0–200)
HDL: 46.9 mg/dL (ref >39.00)
LDL CALC: 78 mg/dL (ref 0–99)
NONHDL: 104.5
Total CHOL/HDL Ratio: 3
Triglycerides: 135 mg/dL (ref 0.0–149.0)
VLDL: 27 mg/dL (ref 0.0–40.0)

## 2016-11-20 LAB — HEMOGLOBIN A1C: Hgb A1c MFr Bld: 6.7 % — ABNORMAL HIGH (ref 4.6–6.5)

## 2016-11-23 DIAGNOSIS — M25561 Pain in right knee: Secondary | ICD-10-CM | POA: Diagnosis not present

## 2016-12-03 DIAGNOSIS — M23331 Other meniscus derangements, other medial meniscus, right knee: Secondary | ICD-10-CM | POA: Diagnosis not present

## 2016-12-03 DIAGNOSIS — M25561 Pain in right knee: Secondary | ICD-10-CM | POA: Diagnosis not present

## 2016-12-13 ENCOUNTER — Other Ambulatory Visit: Payer: Self-pay | Admitting: Family Medicine

## 2016-12-13 DIAGNOSIS — I10 Essential (primary) hypertension: Secondary | ICD-10-CM

## 2017-01-03 DIAGNOSIS — M94261 Chondromalacia, right knee: Secondary | ICD-10-CM | POA: Diagnosis not present

## 2017-01-03 DIAGNOSIS — M23221 Derangement of posterior horn of medial meniscus due to old tear or injury, right knee: Secondary | ICD-10-CM | POA: Diagnosis not present

## 2017-01-03 DIAGNOSIS — G8918 Other acute postprocedural pain: Secondary | ICD-10-CM | POA: Diagnosis not present

## 2017-01-03 DIAGNOSIS — M23321 Other meniscus derangements, posterior horn of medial meniscus, right knee: Secondary | ICD-10-CM | POA: Diagnosis not present

## 2017-01-03 DIAGNOSIS — M659 Synovitis and tenosynovitis, unspecified: Secondary | ICD-10-CM | POA: Diagnosis not present

## 2017-01-03 DIAGNOSIS — M94262 Chondromalacia, left knee: Secondary | ICD-10-CM | POA: Diagnosis not present

## 2017-04-10 ENCOUNTER — Other Ambulatory Visit: Payer: Self-pay | Admitting: Family Medicine

## 2017-04-21 DIAGNOSIS — M25561 Pain in right knee: Secondary | ICD-10-CM | POA: Diagnosis not present

## 2017-05-18 ENCOUNTER — Other Ambulatory Visit: Payer: Self-pay | Admitting: Family Medicine

## 2017-05-18 DIAGNOSIS — E781 Pure hyperglyceridemia: Secondary | ICD-10-CM

## 2017-06-11 ENCOUNTER — Other Ambulatory Visit: Payer: Self-pay | Admitting: Family Medicine

## 2017-06-11 DIAGNOSIS — I10 Essential (primary) hypertension: Secondary | ICD-10-CM

## 2017-08-04 ENCOUNTER — Telehealth: Payer: Self-pay | Admitting: Family Medicine

## 2017-08-04 NOTE — Telephone Encounter (Signed)
Copied from [place] [phone]. Topic: Quick Communication - Rx Refill/Question >> Aug 04, 2017  4:46 PM Arletha Grippe wrote: Medication: nebivolol (BYSTOLIC) 10 MG tablet Has the patient contacted their pharmacy? Yes.   (Agent: If no, request that the patient contact the pharmacy for the refill.) Preferred Pharmacy (with phone number or street name): [facility] - change qty 90 day supply, this is required by drugstore, insurance    Agent: Please be advised that RX refills may take up to 3 business days. We ask that you follow-up with your pharmacy.

## 2017-08-05 NOTE — Telephone Encounter (Signed)
Left message for pt to call office back in order to have further refills on Nebivolol(Bystolic) 10mg . Pt also requesting to have a 90 day supply due to insurance  LOV: 05/07/16 Dr. Carollee Herter  [facility]

## 2017-08-06 NOTE — Telephone Encounter (Signed)
Patient needs appointment.  Tried calling patient number busy.

## 2017-08-08 ENCOUNTER — Other Ambulatory Visit: Payer: Self-pay | Admitting: Family Medicine

## 2017-08-08 DIAGNOSIS — I10 Essential (primary) hypertension: Secondary | ICD-10-CM

## 2017-08-11 ENCOUNTER — Other Ambulatory Visit: Payer: Self-pay | Admitting: Family Medicine

## 2017-08-11 DIAGNOSIS — E039 Hypothyroidism, unspecified: Secondary | ICD-10-CM

## 2017-08-11 DIAGNOSIS — R739 Hyperglycemia, unspecified: Secondary | ICD-10-CM

## 2017-08-11 DIAGNOSIS — E785 Hyperlipidemia, unspecified: Secondary | ICD-10-CM

## 2017-08-11 NOTE — Telephone Encounter (Signed)
Author phoned pt to set up an appointment before bystolic can be refilled per Dr. Etter Sjogren. Pt. Compliant, appointment for Dr. Etter Sjogren made for 5/23 at [facility]. Pt wanted to know if she needed labwork done as it has been one year since last lab draw. Lab appointment for 930AM made. Pt aware that she needs to be fasting in event Dr. Etter Sjogren wants to order lipid panel (last done 05/2016). Orders from Dr. Etter Sjogren pending.

## 2017-08-11 NOTE — Telephone Encounter (Signed)
Orders in 

## 2017-08-21 ENCOUNTER — Other Ambulatory Visit: Payer: BLUE CROSS/BLUE SHIELD

## 2017-08-21 ENCOUNTER — Ambulatory Visit: Payer: BLUE CROSS/BLUE SHIELD | Admitting: Family Medicine

## 2017-08-21 ENCOUNTER — Encounter: Payer: Self-pay | Admitting: Family Medicine

## 2017-08-21 VITALS — BP 112/70 | HR 64 | Temp 98.4°F | Resp 16 | Ht 65.0 in | Wt 212.0 lb

## 2017-08-21 DIAGNOSIS — E785 Hyperlipidemia, unspecified: Secondary | ICD-10-CM

## 2017-08-21 DIAGNOSIS — R739 Hyperglycemia, unspecified: Secondary | ICD-10-CM

## 2017-08-21 DIAGNOSIS — E039 Hypothyroidism, unspecified: Secondary | ICD-10-CM

## 2017-08-21 DIAGNOSIS — I10 Essential (primary) hypertension: Secondary | ICD-10-CM | POA: Diagnosis not present

## 2017-08-21 DIAGNOSIS — Z0000 Encounter for general adult medical examination without abnormal findings: Secondary | ICD-10-CM

## 2017-08-21 DIAGNOSIS — E781 Pure hyperglyceridemia: Secondary | ICD-10-CM | POA: Diagnosis not present

## 2017-08-21 LAB — CBC WITH DIFFERENTIAL/PLATELET
BASOS PCT: 0.5 % (ref 0.0–3.0)
Basophils Absolute: 0 10*3/uL (ref 0.0–0.1)
Eosinophils Absolute: 0.1 10*3/uL (ref 0.0–0.7)
Eosinophils Relative: 2.4 % (ref 0.0–5.0)
HCT: 41.2 % (ref 36.0–46.0)
Hemoglobin: 13.9 g/dL (ref 12.0–15.0)
LYMPHS ABS: 2.6 10*3/uL (ref 0.7–4.0)
Lymphocytes Relative: 43 % (ref 12.0–46.0)
MCHC: 33.6 g/dL (ref 30.0–36.0)
MCV: 94.5 fl (ref 78.0–100.0)
MONOS PCT: 7.8 % (ref 3.0–12.0)
Monocytes Absolute: 0.5 10*3/uL (ref 0.1–1.0)
NEUTROS ABS: 2.8 10*3/uL (ref 1.4–7.7)
NEUTROS PCT: 46.3 % (ref 43.0–77.0)
Platelets: 196 10*3/uL (ref 150.0–400.0)
RBC: 4.36 Mil/uL (ref 3.87–5.11)
RDW: 13.3 % (ref 11.5–15.5)
WBC: 6 10*3/uL (ref 4.0–10.5)

## 2017-08-21 LAB — COMPREHENSIVE METABOLIC PANEL
ALT: 15 U/L (ref 0–35)
AST: 15 U/L (ref 0–37)
Albumin: 4.6 g/dL (ref 3.5–5.2)
Alkaline Phosphatase: 47 U/L (ref 39–117)
BILIRUBIN TOTAL: 0.6 mg/dL (ref 0.2–1.2)
BUN: 11 mg/dL (ref 6–23)
CHLORIDE: 103 meq/L (ref 96–112)
CO2: 31 meq/L (ref 19–32)
CREATININE: 0.78 mg/dL (ref 0.40–1.20)
Calcium: 9.7 mg/dL (ref 8.4–10.5)
GFR: 79.17 mL/min (ref >60.00)
Glucose, Bld: 119 mg/dL — ABNORMAL HIGH (ref 70–99)
Potassium: 4.5 mEq/L (ref 3.5–5.1)
Sodium: 141 mEq/L (ref 135–145)
Total Protein: 7.2 g/dL (ref 6.0–8.3)

## 2017-08-21 LAB — LIPID PANEL
Cholesterol: 152 mg/dL (ref 0–200)
HDL: 41.8 mg/dL (ref >39.00)
LDL Cholesterol: 76 mg/dL (ref 0–99)
NONHDL: 109.81
Total CHOL/HDL Ratio: 4
Triglycerides: 167 mg/dL — ABNORMAL HIGH (ref 0.0–149.0)
VLDL: 33.4 mg/dL (ref 0.0–40.0)

## 2017-08-21 LAB — MICROALBUMIN / CREATININE URINE RATIO
CREATININE, U: 115.6 mg/dL
MICROALB UR: 0.9 mg/dL (ref 0.0–1.9)
Microalb Creat Ratio: 0.8 mg/g (ref 0.0–30.0)

## 2017-08-21 LAB — TSH: TSH: 1.9 u[IU]/mL (ref 0.35–4.50)

## 2017-08-21 LAB — HEMOGLOBIN A1C: Hgb A1c MFr Bld: 6.7 % — ABNORMAL HIGH (ref 4.6–6.5)

## 2017-08-21 MED ORDER — NEBIVOLOL HCL 10 MG PO TABS: ORAL_TABLET | ORAL | 1 refills | Status: DC

## 2017-08-21 MED ORDER — FENOFIBRATE MICRONIZED 90 MG PO CAPS: 1.0000 | ORAL_CAPSULE | Freq: Every day | ORAL | 1 refills | Status: DC

## 2017-08-21 NOTE — Assessment & Plan Note (Signed)
Tolerating statin, encouraged heart healthy diet, avoid trans fats, minimize simple carbs and saturated fats. Increase exercise as tolerated 

## 2017-08-21 NOTE — Assessment & Plan Note (Addendum)
Stable  con't meds Well controlled, no changes to meds. Encouraged heart healthy diet such as the DASH diet and exercise as tolerated.  

## 2017-08-21 NOTE — Assessment & Plan Note (Signed)
con't synthroid Lab Results  Component Value Date   TSH 1.90 08/21/2017

## 2017-08-21 NOTE — Progress Notes (Signed)
Patient ID: Deiondra Denley, female    DOB: November 18, 1955  Age: 62 y.o. MRN: [id number]    Subjective:  Subjective  HPI Gauri Galvao Cassell presents for f/u htn, cholesterol and thyroid.  No complaints.    Review of Systems  Constitutional: Negative for fever.  HENT: Negative for congestion.   Respiratory: Negative for shortness of breath.   Cardiovascular: Negative for chest pain, palpitations and leg swelling.  Gastrointestinal: Negative for abdominal pain, blood in stool and nausea.  Genitourinary: Negative for dysuria and frequency.  Skin: Negative for rash.  Allergic/Immunologic: Negative for environmental allergies.  Neurological: Negative for dizziness and headaches.  Psychiatric/Behavioral: The patient is not nervous/anxious.     History Past Medical History:  Diagnosis Date  . Hypertension   . Thyroid disease    Hypothyridism    She has a past surgical history that includes Oophorectomy (1980) and Cataract extraction (Right, 2015).   Her family history includes Hypertension in her father and mother; Thyroid disease in her unknown relative.She reports that she has never smoked. She has never used smokeless tobacco. She reports that she does not drink alcohol or use drugs.  Current Outpatient Medications on File Prior to Visit  Medication Sig Dispense Refill  . glucose blood test strip Onetouch verio test strips. Check blood sugar once a day 100 each 0  . levothyroxine (SYNTHROID, LEVOTHROID) 100 MCG tablet Take 1 tablet (100 mcg total) by mouth daily. 90 tablet 3  . levothyroxine (SYNTHROID, LEVOTHROID) 100 MCG tablet TAKE 1 TABLET BY MOUTH EVERY DAY 90 tablet 2  . Omega-3 Fatty Acids (FISH OIL) 1200 MG CAPS Take 1 capsule by mouth daily.    [name] DELICA LANCETS FINE MISC 1 Device by Does not apply route daily. 100 each 0  . triamcinolone cream (KENALOG) 0.1 % APPLY TO AFFECTED AREA EVERY DAY  4  . vitamin E 400 UNIT capsule Take 400 Units by mouth daily.     No  current facility-administered medications on file prior to visit.      Objective:  Objective  Physical Exam  Constitutional: She is oriented to person, place, and time. She appears well-developed and well-nourished.  HENT:  Head: Normocephalic and atraumatic.  Eyes: Conjunctivae and EOM are normal.  Neck: Normal range of motion. Neck supple. No JVD present. Carotid bruit is not present. No thyromegaly present.  Cardiovascular: Normal rate, regular rhythm and normal heart sounds.  No murmur heard. Pulmonary/Chest: Effort normal and breath sounds normal. No respiratory distress. She has no wheezes. She has no rales. She exhibits no tenderness.  Musculoskeletal: She exhibits no edema.  Neurological: She is alert and oriented to person, place, and time.  Psychiatric: She has a normal mood and affect.  Nursing note and vitals reviewed.  BP 112/70   Pulse 64   Temp 98.4 F (36.9 C) (Oral)   Resp 16   Ht 5\' 5"  (1.651 m)   Wt 212 lb (96.2 kg)   SpO2 96%   BMI 35.28 kg/m  Wt Readings from Last 3 Encounters:  08/22/17 212 lb (96.2 kg)  05/07/16 213 lb (96.6 kg)  09/11/15 210 lb 6.4 oz (95.4 kg)     Lab Results  Component Value Date   WBC 6.0 08/21/2017   HGB 13.9 08/21/2017   HCT 41.2 08/21/2017   PLT 196.0 08/21/2017   GLUCOSE 119 (H) 08/21/2017   CHOL 152 08/21/2017   TRIG 167.0 (H) 08/21/2017   HDL 41.80 08/21/2017   LDLDIRECT 98.1  02/17/2009   LDLCALC 76 08/21/2017   ALT 15 08/21/2017   AST 15 08/21/2017   NA 141 08/21/2017   K 4.5 08/21/2017   CL 103 08/21/2017   CREATININE 0.78 08/21/2017   BUN 11 08/21/2017   CO2 31 08/21/2017   TSH 1.90 08/21/2017   HGBA1C 6.7 (H) 08/21/2017   MICROALBUR 0.9 08/21/2017    No results found.   Assessment & Plan:  Plan  I have discontinued Delisha Peaden. Mcevoy's Fenofibrate Micronized and nebivolol. I have also changed her BYSTOLIC to nebivolol and ANTARA to Fenofibrate Micronized. Additionally, I am having her maintain her  glucose blood, ONETOUCH DELICA LANCETS FINE, vitamin E, Fish Oil, triamcinolone cream, levothyroxine, and levothyroxine.  Meds ordered this encounter  Medications  . nebivolol (BYSTOLIC) 10 MG tablet    Sig: TAKE 1 TABLET (10 MG TOTAL) BY MOUTH DAILY.    Dispense:  90 tablet    Refill:  1  . Fenofibrate Micronized (ANTARA) 90 MG CAPS    Sig: Take 1 tablet by mouth daily.    Dispense:  90 capsule    Refill:  1    Problem List Items Addressed This Visit      Unprioritized   Essential hypertension    Stable con't meds Well controlled, no changes to meds. Encouraged heart healthy diet such as the DASH diet and exercise as tolerated.       Relevant Medications   nebivolol (BYSTOLIC) 10 MG tablet   Fenofibrate Micronized (ANTARA) 90 MG CAPS   Other Relevant Orders   Microalbumin / creatinine urine ratio (Completed)   Hyperlipidemia    Tolerating statin, encouraged heart healthy diet, avoid trans fats, minimize simple carbs and saturated fats. Increase exercise as tolerated      Relevant Medications   nebivolol (BYSTOLIC) 10 MG tablet   Fenofibrate Micronized (ANTARA) 90 MG CAPS   Hypothyroidism    con't synthroid Lab Results  Component Value Date   TSH 1.90 08/21/2017         Relevant Medications   nebivolol (BYSTOLIC) 10 MG tablet    Other Visit Diagnoses    Preventative health care    -  Primary   Relevant Orders   Ambulatory referral to Gastroenterology   Hypertriglyceridemia       Relevant Medications   nebivolol (BYSTOLIC) 10 MG tablet   Fenofibrate Micronized (ANTARA) 90 MG CAPS   Hyperglycemia       Hyperlipidemia LDL goal <100       Relevant Medications   nebivolol (BYSTOLIC) 10 MG tablet   Fenofibrate Micronized (ANTARA) 90 MG CAPS      Follow-up: Return in about 6 months (around 02/21/2018), or if symptoms worsen or fail to improve, for fasting, annual exam.  [name], DO

## 2017-08-21 NOTE — Patient Instructions (Signed)

## 2018-01-03 ENCOUNTER — Other Ambulatory Visit: Payer: Self-pay | Admitting: Family Medicine

## 2018-01-28 ENCOUNTER — Encounter: Payer: Self-pay | Admitting: Family Medicine

## 2018-02-23 ENCOUNTER — Ambulatory Visit: Payer: BLUE CROSS/BLUE SHIELD | Admitting: Family Medicine

## 2018-02-23 ENCOUNTER — Encounter: Payer: Self-pay | Admitting: Family Medicine

## 2018-02-23 VITALS — BP 117/67 | HR 68 | Temp 98.3°F | Resp 16 | Ht 65.0 in | Wt 213.0 lb

## 2018-02-23 DIAGNOSIS — E785 Hyperlipidemia, unspecified: Secondary | ICD-10-CM | POA: Diagnosis not present

## 2018-02-23 DIAGNOSIS — I10 Essential (primary) hypertension: Secondary | ICD-10-CM | POA: Diagnosis not present

## 2018-02-23 DIAGNOSIS — E039 Hypothyroidism, unspecified: Secondary | ICD-10-CM

## 2018-02-23 LAB — LIPID PANEL
CHOL/HDL RATIO: 3
CHOLESTEROL: 133 mg/dL (ref 0–200)
HDL: 44.1 mg/dL (ref >39.00)
LDL CALC: 65 mg/dL (ref 0–99)
NonHDL: 89.34
TRIGLYCERIDES: 121 mg/dL (ref 0.0–149.0)
VLDL: 24.2 mg/dL (ref 0.0–40.0)

## 2018-02-23 LAB — COMPREHENSIVE METABOLIC PANEL
ALBUMIN: 4.6 g/dL (ref 3.5–5.2)
ALT: 14 U/L (ref 0–35)
AST: 15 U/L (ref 0–37)
Alkaline Phosphatase: 48 U/L (ref 39–117)
BUN: 10 mg/dL (ref 6–23)
CHLORIDE: 104 meq/L (ref 96–112)
CO2: 29 meq/L (ref 19–32)
CREATININE: 0.74 mg/dL (ref 0.40–1.20)
Calcium: 9.8 mg/dL (ref 8.4–10.5)
GFR: 83.99 mL/min (ref >60.00)
Glucose, Bld: 123 mg/dL — ABNORMAL HIGH (ref 70–99)
POTASSIUM: 4.1 meq/L (ref 3.5–5.1)
SODIUM: 141 meq/L (ref 135–145)
Total Bilirubin: 0.4 mg/dL (ref 0.2–1.2)
Total Protein: 7 g/dL (ref 6.0–8.3)

## 2018-02-23 LAB — HEMOGLOBIN A1C: HEMOGLOBIN A1C: 6.8 % — AB (ref 4.6–6.5)

## 2018-02-23 LAB — TSH: TSH: 4.1 u[IU]/mL (ref 0.35–4.50)

## 2018-02-23 NOTE — Patient Instructions (Signed)
DASH Eating Plan DASH stands for "Dietary Approaches to Stop Hypertension." The DASH eating plan is a healthy eating plan that has been shown to reduce high blood pressure (hypertension). It may also reduce your risk for type 2 diabetes, heart disease, and stroke. The DASH eating plan may also help with weight loss. What are tips for following this plan? General guidelines  Avoid eating more than 2,300 mg (milligrams) of salt (sodium) a day. If you have hypertension, you may need to reduce your sodium intake to 1,500 mg a day.  Limit alcohol intake to no more than 1 drink a day for nonpregnant women and 2 drinks a day for men. One drink equals 12 oz of beer, 5 oz of wine, or 1 oz of hard liquor.  Work with your health care provider to maintain a healthy body weight or to lose weight. Ask what an ideal weight is for you.  Get at least 30 minutes of exercise that causes your heart to beat faster (aerobic exercise) most days of the week. Activities may include walking, swimming, or biking.  Work with your health care provider or diet and nutrition specialist (dietitian) to adjust your eating plan to your individual calorie needs. Reading food labels  Check food labels for the amount of sodium per serving. Choose foods with less than 5 percent of the Daily Value of sodium. Generally, foods with less than 300 mg of sodium per serving fit into this eating plan.  To find whole grains, look for the word "whole" as the first word in the ingredient list. Shopping  Buy products labeled as "low-sodium" or "no salt added."  Buy fresh foods. Avoid canned foods and premade or frozen meals. Cooking  Avoid adding salt when cooking. Use salt-free seasonings or herbs instead of table salt or sea salt. Check with your health care provider or pharmacist before using salt substitutes.  Do not fry foods. Cook foods using healthy methods such as baking, boiling, grilling, and broiling instead.  Cook with  heart-healthy oils, such as olive, canola, soybean, or sunflower oil. Meal planning   Eat a balanced diet that includes: ? 5 or more servings of fruits and vegetables each day. At each meal, try to fill half of your plate with fruits and vegetables. ? Up to 6-8 servings of whole grains each day. ? Less than 6 oz of lean meat, poultry, or fish each day. A 3-oz serving of meat is about the same size as a deck of cards. One egg equals 1 oz. ? 2 servings of low-fat dairy each day. ? A serving of nuts, seeds, or beans 5 times each week. ? Heart-healthy fats. Healthy fats called Omega-3 fatty acids are found in foods such as flaxseeds and coldwater fish, like sardines, salmon, and mackerel.  Limit how much you eat of the following: ? Canned or prepackaged foods. ? Food that is high in trans fat, such as fried foods. ? Food that is high in saturated fat, such as fatty meat. ? Sweets, desserts, sugary drinks, and other foods with added sugar. ? Full-fat dairy products.  Do not salt foods before eating.  Try to eat at least 2 vegetarian meals each week.  Eat more home-cooked food and less restaurant, buffet, and fast food.  When eating at a restaurant, ask that your food be prepared with less salt or no salt, if possible. What foods are recommended? The items listed may not be a complete list. Talk with your dietitian about what   dietary choices are best for you. Grains Whole-grain or whole-wheat bread. Whole-grain or whole-wheat pasta. Brown rice. Oatmeal. Quinoa. Bulgur. Whole-grain and low-sodium cereals. Pita bread. Low-fat, low-sodium crackers. Whole-wheat flour tortillas. Vegetables Fresh or frozen vegetables (raw, steamed, roasted, or grilled). Low-sodium or reduced-sodium tomato and vegetable juice. Low-sodium or reduced-sodium tomato sauce and tomato paste. Low-sodium or reduced-sodium canned vegetables. Fruits All fresh, dried, or frozen fruit. Canned fruit in natural juice (without  added sugar). Meat and other protein foods Skinless chicken or turkey. Ground chicken or turkey. Pork with fat trimmed off. Fish and seafood. Egg whites. Dried beans, peas, or lentils. Unsalted nuts, nut butters, and seeds. Unsalted canned beans. Lean cuts of beef with fat trimmed off. Low-sodium, lean deli meat. Dairy Low-fat (1%) or fat-free (skim) milk. Fat-free, low-fat, or reduced-fat cheeses. Nonfat, low-sodium ricotta or cottage cheese. Low-fat or nonfat yogurt. Low-fat, low-sodium cheese. Fats and oils Soft margarine without trans fats. Vegetable oil. Low-fat, reduced-fat, or light mayonnaise and salad dressings (reduced-sodium). Canola, safflower, olive, soybean, and sunflower oils. Avocado. Seasoning and other foods Herbs. Spices. Seasoning mixes without salt. Unsalted popcorn and pretzels. Fat-free sweets. What foods are not recommended? The items listed may not be a complete list. Talk with your dietitian about what dietary choices are best for you. Grains Baked goods made with fat, such as croissants, muffins, or some breads. Dry pasta or rice meal packs. Vegetables Creamed or fried vegetables. Vegetables in a cheese sauce. Regular canned vegetables (not low-sodium or reduced-sodium). Regular canned tomato sauce and paste (not low-sodium or reduced-sodium). Regular tomato and vegetable juice (not low-sodium or reduced-sodium). Pickles. Olives. Fruits Canned fruit in a light or heavy syrup. Fried fruit. Fruit in cream or butter sauce. Meat and other protein foods Fatty cuts of meat. Ribs. Fried meat. Bacon. Sausage. Bologna and other processed lunch meats. Salami. Fatback. Hotdogs. Bratwurst. Salted nuts and seeds. Canned beans with added salt. Canned or smoked fish. Whole eggs or egg yolks. Chicken or turkey with skin. Dairy Whole or 2% milk, cream, and half-and-half. Whole or full-fat cream cheese. Whole-fat or sweetened yogurt. Full-fat cheese. Nondairy creamers. Whipped toppings.  Processed cheese and cheese spreads. Fats and oils Butter. Stick margarine. Lard. Shortening. Ghee. Bacon fat. Tropical oils, such as coconut, palm kernel, or palm oil. Seasoning and other foods Salted popcorn and pretzels. Onion salt, garlic salt, seasoned salt, table salt, and sea salt. Worcestershire sauce. Tartar sauce. Barbecue sauce. Teriyaki sauce. Soy sauce, including reduced-sodium. Steak sauce. Canned and packaged gravies. Fish sauce. Oyster sauce. Cocktail sauce. Horseradish that you find on the shelf. Ketchup. Mustard. Meat flavorings and tenderizers. Bouillon cubes. Hot sauce and Tabasco sauce. Premade or packaged marinades. Premade or packaged taco seasonings. Relishes. Regular salad dressings. Where to find more information:  National Heart, Lung, and Blood Institute: www.nhlbi.nih.gov  American Heart Association: www.heart.org Summary  The DASH eating plan is a healthy eating plan that has been shown to reduce high blood pressure (hypertension). It may also reduce your risk for type 2 diabetes, heart disease, and stroke.  With the DASH eating plan, you should limit salt (sodium) intake to 2,300 mg a day. If you have hypertension, you may need to reduce your sodium intake to 1,500 mg a day.  When on the DASH eating plan, aim to eat more fresh fruits and vegetables, whole grains, lean proteins, low-fat dairy, and heart-healthy fats.  Work with your health care provider or diet and nutrition specialist (dietitian) to adjust your eating plan to your individual   calorie needs. This information is not intended to replace advice given to you by your health care provider. Make sure you discuss any questions you have with your health care provider. Document Released: 03/07/2011 Document Revised: 03/11/2016 Document Reviewed: 03/11/2016 Elsevier Interactive Patient Education  2018 Elsevier Inc.  

## 2018-02-23 NOTE — Assessment & Plan Note (Signed)
Well controlled, no changes to meds. Encouraged heart healthy diet such as the DASH diet and exercise as tolerated.  °

## 2018-02-23 NOTE — Assessment & Plan Note (Signed)
Check labs con't meds 

## 2018-02-23 NOTE — Progress Notes (Signed)
Patient ID: Diane Hodges, female    DOB: 04-Feb-1956  Age: 62 y.o. MRN: [id number]    Subjective:  Subjective  HPI Diane Hodges presents for f/u thyroid and bp.  No complaints   Review of Systems  Constitutional: Negative for appetite change, diaphoresis, fatigue and unexpected weight change.  Eyes: Negative for pain, redness and visual disturbance.  Respiratory: Negative for cough, chest tightness, shortness of breath and wheezing.   Cardiovascular: Negative for chest pain, palpitations and leg swelling.  Endocrine: Negative for cold intolerance, heat intolerance, polydipsia, polyphagia and polyuria.  Genitourinary: Negative for difficulty urinating, dysuria and frequency.  Neurological: Negative for dizziness, light-headedness, numbness and headaches.    History Past Medical History:  Diagnosis Date  . Hypertension   . Thyroid disease    Hypothyridism    She has a past surgical history that includes Oophorectomy (1980) and Cataract extraction (Right, 2015).   Her family history includes Hypertension in her father and mother; Thyroid disease in her unknown relative.She reports that she has never smoked. She has never used smokeless tobacco. She reports that she does not drink alcohol or use drugs.  Current Outpatient Medications on File Prior to Visit  Medication Sig Dispense Refill  . Fenofibrate Micronized (ANTARA) 90 MG CAPS Take 1 tablet by mouth daily. 90 capsule 1  . glucose blood test strip Onetouch verio test strips. Check blood sugar once a day 100 each 0  . levothyroxine (SYNTHROID, LEVOTHROID) 100 MCG tablet Take 1 tablet (100 mcg total) by mouth daily. 90 tablet 3  . nebivolol (BYSTOLIC) 10 MG tablet TAKE 1 TABLET (10 MG TOTAL) BY MOUTH DAILY. 90 tablet 1  . Omega-3 Fatty Acids (FISH OIL) 1200 MG CAPS Take 1 capsule by mouth daily.    [name] DELICA LANCETS FINE MISC 1 Device by Does not apply route daily. 100 each 0  . triamcinolone cream (KENALOG) 0.1  % APPLY TO AFFECTED AREA EVERY DAY  4  . vitamin E 400 UNIT capsule Take 400 Units by mouth daily.     No current facility-administered medications on file prior to visit.      Objective:  Objective  Physical Exam  Constitutional: She is oriented to person, place, and time. She appears well-developed and well-nourished.  HENT:  Head: Normocephalic and atraumatic.  Eyes: Conjunctivae and EOM are normal.  Neck: Normal range of motion. Neck supple. No JVD present. Carotid bruit is not present. No thyromegaly present.  Cardiovascular: Normal rate, regular rhythm and normal heart sounds.  No murmur heard. Pulmonary/Chest: Effort normal and breath sounds normal. No respiratory distress. She has no wheezes. She has no rales. She exhibits no tenderness.  Musculoskeletal: She exhibits no edema.  Neurological: She is alert and oriented to person, place, and time.  Psychiatric: She has a normal mood and affect.  Nursing note and vitals reviewed.  BP 117/67 (BP Location: Right Arm, Cuff Size: Large)   Pulse 68   Temp 98.3 F (36.8 C) (Oral)   Resp 16   Ht 5\' 5"  (1.651 m)   Wt 213 lb (96.6 kg)   SpO2 96%   BMI 35.45 kg/m  Wt Readings from Last 3 Encounters:  02/23/18 213 lb (96.6 kg)  08/22/17 212 lb (96.2 kg)  05/07/16 213 lb (96.6 kg)     Lab Results  Component Value Date   WBC 6.0 08/21/2017   HGB 13.9 08/21/2017   HCT 41.2 08/21/2017   PLT 196.0 08/21/2017   GLUCOSE 119 (  H) 08/21/2017   CHOL 152 08/21/2017   TRIG 167.0 (H) 08/21/2017   HDL 41.80 08/21/2017   LDLDIRECT 98.1 02/17/2009   LDLCALC 76 08/21/2017   ALT 15 08/21/2017   AST 15 08/21/2017   NA 141 08/21/2017   K 4.5 08/21/2017   CL 103 08/21/2017   CREATININE 0.78 08/21/2017   BUN 11 08/21/2017   CO2 31 08/21/2017   TSH 1.90 08/21/2017   HGBA1C 6.7 (H) 08/21/2017   MICROALBUR 0.9 08/21/2017    No results found.   Assessment & Plan:  Plan  I am having Diane Hodges. Durocher maintain her glucose blood, ONETOUCH  DELICA LANCETS FINE, vitamin E, Fish Oil, triamcinolone cream, levothyroxine, nebivolol, and Fenofibrate Micronized.  No orders of the defined types were placed in this encounter.   Problem List Items Addressed This Visit      Unprioritized   Essential hypertension - Primary    Well controlled, no changes to meds. Encouraged heart healthy diet such as the DASH diet and exercise as tolerated.       Relevant Orders   Comprehensive metabolic panel   Hyperlipidemia    Encouraged heart healthy diet, increase exercise, avoid trans fats, consider a krill oil cap daily      Relevant Orders   Comprehensive metabolic panel   Lipid panel   Hemoglobin A1c   TSH   Hypothyroidism    Check labs  con't meds      Relevant Orders   TSH      Follow-up: Return in about 6 months (around 08/24/2018), or if symptoms worsen or fail to improve.  [name], DO

## 2018-02-23 NOTE — Assessment & Plan Note (Signed)
Encouraged heart healthy diet, increase exercise, avoid trans fats, consider a krill oil cap daily 

## 2018-03-03 ENCOUNTER — Other Ambulatory Visit: Payer: Self-pay | Admitting: Family Medicine

## 2018-03-03 DIAGNOSIS — I10 Essential (primary) hypertension: Secondary | ICD-10-CM

## 2018-03-03 MED ORDER — NEBIVOLOL HCL 10 MG PO TABS: ORAL_TABLET | ORAL | 1 refills | Status: DC

## 2018-03-19 ENCOUNTER — Telehealth: Payer: Self-pay | Admitting: Family Medicine

## 2018-03-19 ENCOUNTER — Other Ambulatory Visit: Payer: Self-pay

## 2018-03-19 MED ORDER — METFORMIN HCL 500 MG PO TABS: 500.0000 mg | ORAL_TABLET | Freq: Two times a day (BID) | ORAL | 3 refills | Status: DC

## 2018-03-19 NOTE — Addendum Note (Signed)
Addended by: Magdalene Molly A on: 03/19/2018 10:45 AM   Modules accepted: Orders

## 2018-03-19 NOTE — Telephone Encounter (Signed)
Copied from [place] [phone]. Topic: General - Other >> Mar 19, 2018 10:50 AM Carolyn Stare wrote:  Message left for pt to  call back for lab results there is no CRM

## 2018-03-19 NOTE — Telephone Encounter (Signed)
Charted in result notes. 

## 2018-05-10 ENCOUNTER — Other Ambulatory Visit: Payer: Self-pay | Admitting: Family Medicine

## 2018-05-10 DIAGNOSIS — E781 Pure hyperglyceridemia: Secondary | ICD-10-CM

## 2018-06-22 ENCOUNTER — Other Ambulatory Visit: Payer: BLUE CROSS/BLUE SHIELD

## 2018-08-03 ENCOUNTER — Other Ambulatory Visit: Payer: BLUE CROSS/BLUE SHIELD

## 2018-09-01 ENCOUNTER — Telehealth: Payer: Self-pay | Admitting: *Deleted

## 2018-09-01 NOTE — Telephone Encounter (Signed)
Patient would like a call back to discuss when she will do labs in combination with a virtual visit.

## 2018-09-01 NOTE — Telephone Encounter (Signed)
Left message on machine to call to change visit for Thursday to a virtual visit.

## 2018-09-03 ENCOUNTER — Other Ambulatory Visit: Payer: Self-pay

## 2018-09-03 ENCOUNTER — Encounter: Payer: Self-pay | Admitting: Family Medicine

## 2018-09-03 ENCOUNTER — Telehealth: Payer: Self-pay | Admitting: Family Medicine

## 2018-09-03 ENCOUNTER — Ambulatory Visit (INDEPENDENT_AMBULATORY_CARE_PROVIDER_SITE_OTHER): Payer: BLUE CROSS/BLUE SHIELD | Admitting: Family Medicine

## 2018-09-03 DIAGNOSIS — E1169 Type 2 diabetes mellitus with other specified complication: Secondary | ICD-10-CM

## 2018-09-03 DIAGNOSIS — E039 Hypothyroidism, unspecified: Secondary | ICD-10-CM

## 2018-09-03 DIAGNOSIS — E1165 Type 2 diabetes mellitus with hyperglycemia: Secondary | ICD-10-CM

## 2018-09-03 DIAGNOSIS — E781 Pure hyperglyceridemia: Secondary | ICD-10-CM

## 2018-09-03 DIAGNOSIS — I10 Essential (primary) hypertension: Secondary | ICD-10-CM

## 2018-09-03 DIAGNOSIS — E785 Hyperlipidemia, unspecified: Secondary | ICD-10-CM

## 2018-09-03 MED ORDER — NEBIVOLOL HCL 10 MG PO TABS: ORAL_TABLET | ORAL | 1 refills | Status: DC

## 2018-09-03 MED ORDER — LEVOTHYROXINE SODIUM 100 MCG PO TABS: 100.0000 ug | ORAL_TABLET | Freq: Every day | ORAL | 3 refills | Status: DC

## 2018-09-03 MED ORDER — BLOOD GLUCOSE MONITOR KIT: PACK | 0 refills | Status: AC

## 2018-09-03 MED ORDER — FENOFIBRATE MICRONIZED 90 MG PO CAPS: 1.0000 | ORAL_CAPSULE | Freq: Every day | ORAL | 1 refills | Status: DC

## 2018-09-03 NOTE — Progress Notes (Signed)
Virtual Visit via Video Note  I connected with Diane Hodges on 09/03/18 at  9:20 AM EDT by a video enabled telemedicine application and verified that I am speaking with the correct person using two identifiers.  Location: Patient: home Provider: home    I discussed the limitations of evaluation and management by telemedicine and the availability of in person appointments. The patient expressed understanding and agreed to proceed.  History of Present Illness: Pt is home with no complaints except she needs to lose weight.  She has struggled with the quarantine  she is not exercising   She has no other complaints.  Low salt diet- no    DIABETES    Blood Sugar ranges-not checking   Polyuria- no New Visual problems- no  Hypoglycemic symptoms- no  Other side effects-no Medication compliance - not taking because she was afraid of side effects     HYPERLIPIDEMIA  Medication compliance- good RUQ pain- no  Muscle aches-no Other side effects-no   ROS See HPI above   PMH Smoking Status noted   Past Medical History:  Diagnosis Date  . Hypertension   . Thyroid disease    Hypothyridism   Current Outpatient Medications on File Prior to Visit  Medication Sig Dispense Refill  . glucose blood test strip Onetouch verio test strips. Check blood sugar once a day 100 each 0  . Omega-3 Fatty Acids (FISH OIL) 1200 MG CAPS Take 1 capsule by mouth daily.    [name] DELICA LANCETS FINE MISC 1 Device by Does not apply route daily. 100 each 0  . triamcinolone cream (KENALOG) 0.1 % APPLY TO AFFECTED AREA EVERY DAY  4  . vitamin E 400 UNIT capsule Take 400 Units by mouth daily.    . metFORMIN (GLUCOPHAGE) 500 MG tablet Take 1 tablet (500 mg total) by mouth 2 (two) times daily with a meal. (Patient not taking: Reported on 09/03/2018) 180 tablet 3   No current facility-administered medications on file prior to visit.    Family History  Problem Relation Age of Onset  . Thyroid disease  Unknown   . Hypertension Mother   . Hypertension Father    Observations/Objective: No vitals obtained--- she will check her bp and call us back   Assessment and Plan: 1. Hypothyroidism, unspecified type Check labs  Stable con't meds  - TSH; Future - levothyroxine (SYNTHROID) 100 MCG tablet; Take 1 tablet (100 mcg total) by mouth daily.  Dispense: 90 tablet; Refill: 3  2. Hyperlipidemia associated with type 2 diabetes mellitus ([place]) Encouraged heart healthy diet, increase exercise, avoid trans fats, consider a krill oil cap daily  - Lipid panel; Future - Comprehensive metabolic panel; Future - Fenofibrate Micronized (ANTARA) 90 MG CAPS; Take 1 tablet by mouth daily.  Dispense: 90 capsule; Refill: 1  3. Uncontrolled type 2 diabetes mellitus with hyperglycemia ([place]) hgba1c to be checked , minimize simple carbs. Increase exercise as tolerated.  - Hemoglobin A1c; Future - blood glucose meter kit and supplies KIT; Dispense based on patient and insurance preference. Use up to four times daily as directed. (FOR ICD-9 250.00, 250.01).  Dispense: 1 each; Refill: 0  4. Morbid obesity ([place]) D/w pt diet and exercise Referral placed to healthy weight and wellness  - Amb Ref to Medical Weight Management  5. Essential hypertension Well controlled, no changes to meds. Encouraged heart healthy diet such as the DASH diet and exercise as tolerated.   - nebivolol (BYSTOLIC) 10 MG tablet; TAKE 1 TABLET (  10 MG TOTAL) BY MOUTH DAILY.  Dispense: 90 tablet; Refill: 1  6. Hypertriglyceridemia Encouraged heart healthy diet, increase exercise, avoid trans fats, consider a krill oil cap daily - Fenofibrate Micronized (ANTARA) 90 MG CAPS; Take 1 tablet by mouth daily.  Dispense: 90 capsule; Refill: 1   Follow Up Instructions:    I discussed the assessment and treatment plan with the patient. The patient was provided an opportunity to ask questions and all were answered. The patient agreed with the  plan and demonstrated an understanding of the instructions.   The patient was advised to call back or seek an in-person evaluation if the symptoms worsen or if the condition fails to improve as anticipated.  I provided 25 minutes of non-face-to-face time during this encounter.   [name], DO

## 2018-09-03 NOTE — Telephone Encounter (Signed)
LVM for pt to call the office and schedule lab appt per provider, (pt needing lab appt) and also to schedule CPE 6 month fu appt.

## 2018-10-14 ENCOUNTER — Encounter: Payer: Self-pay | Admitting: *Deleted

## 2018-12-04 DIAGNOSIS — D485 Neoplasm of uncertain behavior of skin: Secondary | ICD-10-CM | POA: Diagnosis not present

## 2018-12-23 ENCOUNTER — Other Ambulatory Visit: Payer: Self-pay

## 2018-12-23 ENCOUNTER — Other Ambulatory Visit (INDEPENDENT_AMBULATORY_CARE_PROVIDER_SITE_OTHER): Payer: BC Managed Care – PPO

## 2018-12-23 DIAGNOSIS — E039 Hypothyroidism, unspecified: Secondary | ICD-10-CM

## 2018-12-23 DIAGNOSIS — E785 Hyperlipidemia, unspecified: Secondary | ICD-10-CM

## 2018-12-23 DIAGNOSIS — E1165 Type 2 diabetes mellitus with hyperglycemia: Secondary | ICD-10-CM | POA: Diagnosis not present

## 2018-12-23 DIAGNOSIS — E1169 Type 2 diabetes mellitus with other specified complication: Secondary | ICD-10-CM | POA: Diagnosis not present

## 2018-12-23 LAB — COMPREHENSIVE METABOLIC PANEL
ALT: 15 U/L (ref 0–35)
AST: 14 U/L (ref 0–37)
Albumin: 4.9 g/dL (ref 3.5–5.2)
Alkaline Phosphatase: 48 U/L (ref 39–117)
BUN: 14 mg/dL (ref 6–23)
CO2: 29 mEq/L (ref 19–32)
Calcium: 10.1 mg/dL (ref 8.4–10.5)
Chloride: 99 mEq/L (ref 96–112)
Creatinine, Ser: 0.76 mg/dL (ref 0.40–1.20)
GFR: 76.43 mL/min (ref >60.00)
Glucose, Bld: 125 mg/dL — ABNORMAL HIGH (ref 70–99)
Potassium: 4.1 mEq/L (ref 3.5–5.1)
Sodium: 138 mEq/L (ref 135–145)
Total Bilirubin: 0.5 mg/dL (ref 0.2–1.2)
Total Protein: 7.3 g/dL (ref 6.0–8.3)

## 2018-12-23 LAB — LIPID PANEL
Cholesterol: 162 mg/dL (ref 0–200)
HDL: 47.6 mg/dL (ref >39.00)
LDL Cholesterol: 80 mg/dL (ref 0–99)
NonHDL: 114.69
Total CHOL/HDL Ratio: 3
Triglycerides: 171 mg/dL — ABNORMAL HIGH (ref 0.0–149.0)
VLDL: 34.2 mg/dL (ref 0.0–40.0)

## 2018-12-23 LAB — HEMOGLOBIN A1C: Hgb A1c MFr Bld: 7 % — ABNORMAL HIGH (ref 4.6–6.5)

## 2018-12-23 LAB — TSH: TSH: 3.54 u[IU]/mL (ref 0.35–4.50)

## 2018-12-27 ENCOUNTER — Other Ambulatory Visit: Payer: Self-pay | Admitting: Family Medicine

## 2018-12-27 DIAGNOSIS — E781 Pure hyperglyceridemia: Secondary | ICD-10-CM

## 2019-03-03 ENCOUNTER — Telehealth: Payer: Self-pay

## 2019-03-03 MED ORDER — FENOFIBRATE MICRONIZED 90 MG PO CAPS: 1.0000 | ORAL_CAPSULE | Freq: Every day | ORAL | 1 refills | Status: DC

## 2019-03-03 NOTE — Addendum Note (Signed)
Addended by: Sanda Linger on: 03/03/2019 02:20 PM   Modules accepted: Orders

## 2019-03-03 NOTE — Telephone Encounter (Signed)
Copied from [place] [id number]. Topic: General - Inquiry >> Mar 03, 2019 11:51 AM Virl Axe D wrote: Reason for CRM: Pt would like to know if she needs to schedule a lab appt or office visit with Dr. Etter Sjogren this month. She would like a return call regarding this.  Also pt is wanting to know if there are any discount cards for Entara that Dr. Etter Sjogren can help pt with. She will not have medication refill insurance until January.

## 2019-03-03 NOTE — Telephone Encounter (Signed)
Pt will be on Medicare effective 03/02/2019 but part D prescription benefit will not be effective until 04/02/2019. Medicare will cover the generic fenofibrate and would be much more cost effective for her. Pt is currently out of [place] and not able to fill at current cost.   The coupon cards exclude Medicare. Please advise.   [id number] [address], [place] [phone] (Phone) [phone] (Fax)    She thinks the same thing will happen with the Bystolic if there is an alternate/generic for that medication. She does have 2 months of Bystolic currently.

## 2019-03-03 NOTE — Telephone Encounter (Signed)
Dr. Jess Barters    Generic Rx sent in.

## 2019-03-09 ENCOUNTER — Telehealth: Payer: Self-pay | Admitting: *Deleted

## 2019-03-09 ENCOUNTER — Other Ambulatory Visit: Payer: Self-pay | Admitting: Family Medicine

## 2019-03-09 MED ORDER — CHOLINE FENOFIBRATE 45 MG PO CPDR: DELAYED_RELEASE_CAPSULE | ORAL | 3 refills | Status: DC

## 2019-03-09 NOTE — Telephone Encounter (Signed)
Please make sure pt is aware-- I sent it in

## 2019-03-09 NOTE — Telephone Encounter (Signed)
[facility] from [facility] stating pt's insurance has changed and no longer covers Brand name Antara. Patient is requesting the generic. Pharmacy states they generic in 48mg  capsule and they can give pt 2 capsules per day (96mg ) to be close to the 90mg  dose she is currently taking.  Please advise if ok to change?

## 2019-03-09 NOTE — Telephone Encounter (Signed)
Received additional fax stating there is no direct generic for Antara and to consider switching pt to Fenofibrate 45mg , 2 capsules daily.  Please advise?

## 2019-03-10 ENCOUNTER — Telehealth: Payer: Self-pay

## 2019-03-10 NOTE — Telephone Encounter (Signed)
Received fax dated 12/8 that said drug not covered. Spoke with pharmacist and advised him that per a previous fax, pt had stated she would pay out of pocket ($100/month). Also, they do not have 45mg  capsules in stock and will need to order Rx. Notified pt of expected cost to verify she wanted to proceed. Pt states she will be able to afford medication with a coupon she found on [employer] website. Called pharmacist back to advise him to order med for pt and [facility] [address] [facility]. Notified pt. Her daughter works for [facility] and she will see if she transferred Rx to a different location.

## 2019-03-10 NOTE — Telephone Encounter (Signed)
Copied from [place] [id number]. Topic: General - Inquiry >> Mar 10, 2019 10:15 AM Percell Belt A wrote: Reason for CRM: pt called in and stated that her ins will only cover tabs and not capsules on the [place] Fenofibrate 45 MG capsule [id number] . She would like to know if this can be resent for Tablets.

## 2019-03-11 ENCOUNTER — Other Ambulatory Visit: Payer: Self-pay

## 2019-03-11 NOTE — Telephone Encounter (Signed)
Please see previous phone note--- pharmacy is the one that asked for 2 , 45 mg  Because it was they only thing they would pay for .   That is why I sent it in .

## 2019-03-11 NOTE — Telephone Encounter (Signed)
Spoke with the pharmacy and they state this Rx is not covered under the patient's insurance and not in available in tablets. Please advise

## 2019-03-12 NOTE — Telephone Encounter (Signed)
Spoke with pharmacy again and they stated that no mg tab or caps of fenofibrate is covered under patients insurance.  Patient insurance recently changed since she has retired.  Is there anything else we could try her on?  Patient brought a months worth out of pocket from the pharmacy.

## 2019-03-12 NOTE — Telephone Encounter (Signed)
Pharmacy called and stated the same Rx for Choline Fenofibrate 45 MG capsule  was sent to them but they need the Rx changed to tablets and not capsules and would like it as a 90 day supply/ please advise

## 2019-03-12 NOTE — Telephone Encounter (Signed)
How about lovaza or vacepa ?

## 2019-03-15 ENCOUNTER — Other Ambulatory Visit: Payer: Self-pay | Admitting: Family Medicine

## 2019-03-15 MED ORDER — FENOFIBRATE 54 MG PO TABS: 54.0000 mg | ORAL_TABLET | Freq: Every day | ORAL | 1 refills | Status: DC

## 2019-03-15 NOTE — Telephone Encounter (Signed)
Spoke with Diane Hodges at [facility] regarding prescriptions.  Diane Hodges states Lovaza and Vascepa are more expensive than fenofibrate tablets.  Pharmacy is requesting prescription for Tricor (fenofibrate tablets). Milligram options include 40,48,54 and 120mg .  Also requesting 90 day supply. Diane Hodges states patient is willing to pay out of pocket for medication.   Please advise.

## 2019-03-19 ENCOUNTER — Other Ambulatory Visit: Payer: Self-pay | Admitting: Family Medicine

## 2019-03-19 DIAGNOSIS — I10 Essential (primary) hypertension: Secondary | ICD-10-CM

## 2019-03-19 NOTE — Telephone Encounter (Signed)
Last OV 09/03/18 Last refill 09/03/18 #90/1 Next OV not scheduled

## 2019-04-08 ENCOUNTER — Other Ambulatory Visit: Payer: BC Managed Care – PPO

## 2019-04-21 ENCOUNTER — Telehealth: Payer: Self-pay | Admitting: Family Medicine

## 2019-04-21 NOTE — Telephone Encounter (Signed)
Can try coreg 6.25 bid #60  2 refills Bp check 2-3 weeks

## 2019-04-21 NOTE — Telephone Encounter (Signed)
[facility] alternative for RX: Bystolic, Paramedic price is to high. [facility] is [facility] generic.

## 2019-04-22 ENCOUNTER — Other Ambulatory Visit: Payer: Self-pay

## 2019-04-22 ENCOUNTER — Other Ambulatory Visit (INDEPENDENT_AMBULATORY_CARE_PROVIDER_SITE_OTHER): Payer: Medicare Other

## 2019-04-22 DIAGNOSIS — E781 Pure hyperglyceridemia: Secondary | ICD-10-CM | POA: Diagnosis not present

## 2019-04-22 LAB — COMPREHENSIVE METABOLIC PANEL
ALT: 15 U/L (ref 0–35)
AST: 15 U/L (ref 0–37)
Albumin: 4.7 g/dL (ref 3.5–5.2)
Alkaline Phosphatase: 60 U/L (ref 39–117)
BUN: 15 mg/dL (ref 6–23)
CO2: 29 mEq/L (ref 19–32)
Calcium: 9.3 mg/dL (ref 8.4–10.5)
Chloride: 105 mEq/L (ref 96–112)
Creatinine, Ser: 0.75 mg/dL (ref 0.40–1.20)
GFR: 77.52 mL/min (ref >60.00)
Glucose, Bld: 129 mg/dL — ABNORMAL HIGH (ref 70–99)
Potassium: 4 mEq/L (ref 3.5–5.1)
Sodium: 140 mEq/L (ref 135–145)
Total Bilirubin: 0.5 mg/dL (ref 0.2–1.2)
Total Protein: 7 g/dL (ref 6.0–8.3)

## 2019-04-22 LAB — LIPID PANEL
Cholesterol: 175 mg/dL (ref 0–200)
HDL: 45.9 mg/dL (ref >39.00)
NonHDL: 128.61
Total CHOL/HDL Ratio: 4
Triglycerides: 226 mg/dL — ABNORMAL HIGH (ref 0.0–149.0)
VLDL: 45.2 mg/dL — ABNORMAL HIGH (ref 0.0–40.0)

## 2019-04-22 LAB — LDL CHOLESTEROL, DIRECT: Direct LDL: 94 mg/dL

## 2019-04-22 MED ORDER — CARVEDILOL 6.25 MG PO TABS: 6.2500 mg | ORAL_TABLET | Freq: Two times a day (BID) | ORAL | 2 refills | Status: DC

## 2019-04-22 NOTE — Telephone Encounter (Signed)
Patient notified in office.

## 2019-04-25 ENCOUNTER — Other Ambulatory Visit: Payer: Self-pay | Admitting: Family Medicine

## 2019-04-25 DIAGNOSIS — E785 Hyperlipidemia, unspecified: Secondary | ICD-10-CM

## 2019-04-25 DIAGNOSIS — R739 Hyperglycemia, unspecified: Secondary | ICD-10-CM

## 2019-07-08 ENCOUNTER — Other Ambulatory Visit: Payer: Self-pay | Admitting: Family Medicine

## 2019-10-03 ENCOUNTER — Other Ambulatory Visit: Payer: Self-pay | Admitting: Family Medicine

## 2019-10-03 DIAGNOSIS — E039 Hypothyroidism, unspecified: Secondary | ICD-10-CM

## 2019-10-10 ENCOUNTER — Other Ambulatory Visit: Payer: Self-pay | Admitting: Family Medicine

## 2019-10-21 ENCOUNTER — Encounter: Payer: Self-pay | Admitting: Family Medicine

## 2019-10-21 ENCOUNTER — Other Ambulatory Visit: Payer: Self-pay

## 2019-10-21 ENCOUNTER — Ambulatory Visit (INDEPENDENT_AMBULATORY_CARE_PROVIDER_SITE_OTHER): Payer: Medicare Other | Admitting: Family Medicine

## 2019-10-21 VITALS — BP 126/70 | HR 97 | Temp 98.7°F | Resp 18 | Ht 65.0 in | Wt 211.2 lb

## 2019-10-21 DIAGNOSIS — E039 Hypothyroidism, unspecified: Secondary | ICD-10-CM | POA: Diagnosis not present

## 2019-10-21 DIAGNOSIS — R739 Hyperglycemia, unspecified: Secondary | ICD-10-CM | POA: Diagnosis not present

## 2019-10-21 DIAGNOSIS — I10 Essential (primary) hypertension: Secondary | ICD-10-CM | POA: Diagnosis not present

## 2019-10-21 DIAGNOSIS — E785 Hyperlipidemia, unspecified: Secondary | ICD-10-CM | POA: Diagnosis not present

## 2019-10-21 LAB — COMPREHENSIVE METABOLIC PANEL
ALT: 15 U/L (ref 0–35)
AST: 15 U/L (ref 0–37)
Albumin: 4.8 g/dL (ref 3.5–5.2)
Alkaline Phosphatase: 55 U/L (ref 39–117)
BUN: 14 mg/dL (ref 6–23)
CO2: 29 mEq/L (ref 19–32)
Calcium: 9.7 mg/dL (ref 8.4–10.5)
Chloride: 102 mEq/L (ref 96–112)
Creatinine, Ser: 0.73 mg/dL (ref 0.40–1.20)
GFR: 79.86 mL/min (ref >60.00)
Glucose, Bld: 131 mg/dL — ABNORMAL HIGH (ref 70–99)
Potassium: 4.3 mEq/L (ref 3.5–5.1)
Sodium: 139 mEq/L (ref 135–145)
Total Bilirubin: 0.6 mg/dL (ref 0.2–1.2)
Total Protein: 7.3 g/dL (ref 6.0–8.3)

## 2019-10-21 LAB — LDL CHOLESTEROL, DIRECT: Direct LDL: 88 mg/dL

## 2019-10-21 LAB — HEMOGLOBIN A1C: Hgb A1c MFr Bld: 6.8 % — ABNORMAL HIGH (ref 4.6–6.5)

## 2019-10-21 LAB — LIPID PANEL
Cholesterol: 171 mg/dL (ref 0–200)
HDL: 45.7 mg/dL (ref >39.00)
NonHDL: 125.6
Total CHOL/HDL Ratio: 4
Triglycerides: 204 mg/dL — ABNORMAL HIGH (ref 0.0–149.0)
VLDL: 40.8 mg/dL — ABNORMAL HIGH (ref 0.0–40.0)

## 2019-10-21 LAB — TSH: TSH: 5.87 u[IU]/mL — ABNORMAL HIGH (ref 0.35–4.50)

## 2019-10-21 MED ORDER — CARVEDILOL 6.25 MG PO TABS: 6.2500 mg | ORAL_TABLET | Freq: Two times a day (BID) | ORAL | 2 refills | Status: DC

## 2019-10-21 MED ORDER — LEVOTHYROXINE SODIUM 100 MCG PO TABS: 100.0000 ug | ORAL_TABLET | Freq: Every day | ORAL | 2 refills | Status: DC

## 2019-10-21 MED ORDER — FENOFIBRATE 54 MG PO TABS: 54.0000 mg | ORAL_TABLET | Freq: Every day | ORAL | 1 refills | Status: DC

## 2019-10-21 NOTE — Progress Notes (Signed)
Patient ID: Diane Hodges, female    DOB: 12/30/1955  Age: 64 y.o. MRN: [id number]    Subjective:  Subjective  HPI  Diane Hodges presents for f/u and labs .  No complaints   Review of Systems  Constitutional: Negative for appetite change, diaphoresis, fatigue and unexpected weight change.  Eyes: Negative for pain, redness and visual disturbance.  Respiratory: Negative for cough, chest tightness, shortness of breath and wheezing.   Cardiovascular: Negative for chest pain, palpitations and leg swelling.  Endocrine: Negative for cold intolerance, heat intolerance, polydipsia, polyphagia and polyuria.  Genitourinary: Negative for difficulty urinating, dysuria and frequency.  Neurological: Negative for dizziness, light-headedness, numbness and headaches.    History Past Medical History:  Diagnosis Date  . Atypical nevus 04/12/2014   mid back -mild  . Hypertension   . Thyroid disease    Hypothyridism    She has a past surgical history that includes Oophorectomy (1980) and Cataract extraction (Right, 2015).   Her family history includes Hypertension in her father and mother; Thyroid disease in an other family member.She reports that she has never smoked. She has never used smokeless tobacco. She reports that she does not drink alcohol and does not use drugs.  Current Outpatient Medications on File Prior to Visit  Medication Sig Dispense Refill  . blood glucose meter kit and supplies KIT Dispense based on patient and insurance preference. Use up to four times daily as directed. (FOR ICD-9 250.00, 250.01). 1 each 0  . glucose blood test strip Onetouch verio test strips. Check blood sugar once a day 100 each 0  . metFORMIN (GLUCOPHAGE) 500 MG tablet Take 1 tablet (500 mg total) by mouth 2 (two) times daily with a meal. 180 tablet 3  . Omega-3 Fatty Acids (FISH OIL) 1200 MG CAPS Take 1 capsule by mouth daily.    [name] DELICA LANCETS FINE MISC 1 Device by Does not apply route  daily. 100 each 0  . triamcinolone cream (KENALOG) 0.1 % APPLY TO AFFECTED AREA EVERY DAY  4  . vitamin E 400 UNIT capsule Take 400 Units by mouth daily.     No current facility-administered medications on file prior to visit.     Objective:  Objective  Physical Exam Vitals and nursing note reviewed.  Constitutional:      Appearance: She is well-developed.  HENT:     Head: Normocephalic and atraumatic.  Eyes:     Conjunctiva/sclera: Conjunctivae normal.  Neck:     Thyroid: No thyromegaly.     Vascular: No carotid bruit or JVD.  Cardiovascular:     Rate and Rhythm: Normal rate and regular rhythm.     Heart sounds: Normal heart sounds. No murmur heard.   Pulmonary:     Effort: Pulmonary effort is normal. No respiratory distress.     Breath sounds: Normal breath sounds. No wheezing or rales.  Chest:     Chest wall: No tenderness.  Musculoskeletal:     Cervical back: Normal range of motion and neck supple.  Neurological:     Mental Status: She is alert and oriented to person, place, and time.  Psychiatric:        Mood and Affect: Mood normal.        Behavior: Behavior normal.        Thought Content: Thought content normal.        Judgment: Judgment normal.    BP 126/70 (BP Location: Right Arm, Patient Position: Sitting, Cuff Size: Large)  Pulse 97   Temp 98.7 F (37.1 C) (Oral)   Resp 18   Ht 5' 5"  (1.651 m)   Wt (!) 211 lb 3.2 oz (95.8 kg)   SpO2 94%   BMI 35.15 kg/m  Wt Readings from Last 3 Encounters:  10/21/19 (!) 211 lb 3.2 oz (95.8 kg)  02/23/18 213 lb (96.6 kg)  08/22/17 212 lb (96.2 kg)     Lab Results  Component Value Date   WBC 6.0 08/21/2017   HGB 13.9 08/21/2017   HCT 41.2 08/21/2017   PLT 196.0 08/21/2017   GLUCOSE 131 (H) 10/21/2019   CHOL 171 10/21/2019   TRIG 204.0 (H) 10/21/2019   HDL 45.70 10/21/2019   LDLDIRECT 88.0 10/21/2019   LDLCALC 80 12/23/2018   ALT 15 10/21/2019   AST 15 10/21/2019   NA 139 10/21/2019   K 4.3 10/21/2019     CL 102 10/21/2019   CREATININE 0.73 10/21/2019   BUN 14 10/21/2019   CO2 29 10/21/2019   TSH 5.87 (H) 10/21/2019   HGBA1C 6.8 (H) 10/21/2019   MICROALBUR 0.9 08/21/2017    No results found.   Assessment & Plan:  Plan  I have discontinued [place]. [place]'s Choline Fenofibrate and nebivolol. I have also changed her carvedilol and fenofibrate. Additionally, I am having her maintain her glucose blood, OneTouch Delica Lancets Fine, vitamin E, Fish Oil, triamcinolone cream, metFORMIN, blood glucose meter kit and supplies, and levothyroxine.  Meds ordered this encounter  Medications  . levothyroxine (SYNTHROID) 100 MCG tablet    Sig: Take 1 tablet (100 mcg total) by mouth daily before breakfast.    Dispense:  30 tablet    Refill:  2  . carvedilol (COREG) 6.25 MG tablet    Sig: Take 1 tablet (6.25 mg total) by mouth 2 (two) times daily with a meal.    Dispense:  180 tablet    Refill:  2  . fenofibrate 54 MG tablet    Sig: Take 1 tablet (54 mg total) by mouth daily.    Dispense:  90 tablet    Refill:  1    Problem List Items Addressed This Visit      Unprioritized   Essential hypertension    Well controlled, no changes to meds. Encouraged heart healthy diet such as the DASH diet and exercise as tolerated.       Relevant Medications   carvedilol (COREG) 6.25 MG tablet   fenofibrate 54 MG tablet   Other Relevant Orders   Lipid panel (Completed)   Comprehensive metabolic panel (Completed)   Hyperlipidemia    Tolerating statin, encouraged heart healthy diet, avoid trans fats, minimize simple carbs and saturated fats. Increase exercise as tolerated      Relevant Medications   carvedilol (COREG) 6.25 MG tablet   fenofibrate 54 MG tablet   Hypothyroidism    Check labs  con't meds       Relevant Medications   levothyroxine (SYNTHROID) 100 MCG tablet   carvedilol (COREG) 6.25 MG tablet   Other Relevant Orders   TSH (Completed)    Other Visit Diagnoses    Dyslipidemia     -  Primary   Relevant Medications   fenofibrate 54 MG tablet   Other Relevant Orders   Lipid panel (Completed)   Comprehensive metabolic panel (Completed)   Hemoglobin A1c (Completed)   Hyperglycemia       Relevant Orders   Comprehensive metabolic panel (Completed)   Hemoglobin A1c (Completed)  Follow-up: Return in about 6 months (around 04/22/2020) for annual exam, fasting.  [name], DO

## 2019-10-21 NOTE — Patient Instructions (Signed)
Hypothyroidism  Hypothyroidism is when the thyroid gland does not make enough of certain hormones (it is underactive). The thyroid gland is a small gland located in the lower front part of the neck, just in front of the windpipe (trachea). This gland makes hormones that help control how the body uses food for energy (metabolism) as well as how the heart and brain function. These hormones also play a role in keeping your bones strong. When the thyroid is underactive, it produces too little of the hormones thyroxine (T4) and triiodothyronine (T3). What are the causes? This condition may be caused by:  Hashimoto's disease. This is a disease in which the body's disease-fighting system (immune system) attacks the thyroid gland. This is the most common cause.  Viral infections.  Pregnancy.  Certain medicines.  Birth defects.  Past radiation treatments to the head or neck for cancer.  Past treatment with radioactive iodine.  Past exposure to radiation in the environment.  Past surgical removal of part or all of the thyroid.  Problems with a gland in the center of the brain (pituitary gland).  Lack of enough iodine in the diet. What increases the risk? You are more likely to develop this condition if:  You are female.  You have a family history of thyroid conditions.  You use a medicine called lithium.  You take medicines that affect the immune system (immunosuppressants). What are the signs or symptoms? Symptoms of this condition include:  Feeling as though you have no energy (lethargy).  Not being able to tolerate cold.  Weight gain that is not explained by a change in diet or exercise habits.  Lack of appetite.  Dry skin.  Coarse hair.  Menstrual irregularity.  Slowing of thought processes.  Constipation.  Sadness or depression. How is this diagnosed? This condition may be diagnosed based on:  Your symptoms, your medical history, and a physical exam.  Blood  tests. You may also have imaging tests, such as an ultrasound or MRI. How is this treated? This condition is treated with medicine that replaces the thyroid hormones that your body does not make. After you begin treatment, it may take several weeks for symptoms to go away. Follow these instructions at home:  Take over-the-counter and prescription medicines only as told by your health care provider.  If you start taking any new medicines, tell your health care provider.  Keep all follow-up visits as told by your health care provider. This is important. ? As your condition improves, your dosage of thyroid hormone medicine may change. ? You will need to have blood tests regularly so that your health care provider can monitor your condition. Contact a health care provider if:  Your symptoms do not get better with treatment.  You are taking thyroid replacement medicine and you: ? Sweat a lot. ? Have tremors. ? Feel anxious. ? Lose weight rapidly. ? Cannot tolerate heat. ? Have emotional swings. ? Have diarrhea. ? Feel weak. Get help right away if you have:  Chest pain.  An irregular heartbeat.  A rapid heartbeat.  Difficulty breathing. Summary  Hypothyroidism is when the thyroid gland does not make enough of certain hormones (it is underactive).  When the thyroid is underactive, it produces too little of the hormones thyroxine (T4) and triiodothyronine (T3).  The most common cause is Hashimoto's disease, a disease in which the body's disease-fighting system (immune system) attacks the thyroid gland. The condition can also be caused by viral infections, medicine, pregnancy, or past   radiation treatment to the head or neck.  Symptoms may include weight gain, dry skin, constipation, feeling as though you do not have energy, and not being able to tolerate cold.  This condition is treated with medicine to replace the thyroid hormones that your body does not make. This information  is not intended to replace advice given to you by your health care provider. Make sure you discuss any questions you have with your health care provider. Document Revised: 02/28/2017 Document Reviewed: 02/26/2017 Elsevier Patient Education  2020 [employer]  

## 2019-10-22 ENCOUNTER — Other Ambulatory Visit: Payer: Self-pay | Admitting: Family Medicine

## 2019-10-22 DIAGNOSIS — E1169 Type 2 diabetes mellitus with other specified complication: Secondary | ICD-10-CM

## 2019-10-22 DIAGNOSIS — E1165 Type 2 diabetes mellitus with hyperglycemia: Secondary | ICD-10-CM

## 2019-10-22 DIAGNOSIS — E039 Hypothyroidism, unspecified: Secondary | ICD-10-CM

## 2019-10-22 DIAGNOSIS — E785 Hyperlipidemia, unspecified: Secondary | ICD-10-CM

## 2019-10-22 MED ORDER — LEVOTHYROXINE SODIUM 112 MCG PO TABS
112.0000 ug | ORAL_TABLET | Freq: Every day | ORAL | 2 refills | Status: DC
Start: 2019-10-22 — End: 2019-11-22

## 2019-10-22 MED ORDER — ROSUVASTATIN CALCIUM 10 MG PO TABS
10.0000 mg | ORAL_TABLET | Freq: Every day | ORAL | 2 refills | Status: DC
Start: 2019-10-22 — End: 2019-11-22

## 2019-10-22 NOTE — Assessment & Plan Note (Signed)
Well controlled, no changes to meds. Encouraged heart healthy diet such as the DASH diet and exercise as tolerated.  °

## 2019-10-22 NOTE — Addendum Note (Signed)
Addended by: Wynonia Musty A on: 10/22/2019 10:57 AM   Modules accepted: Orders

## 2019-10-22 NOTE — Assessment & Plan Note (Signed)
Tolerating statin, encouraged heart healthy diet, avoid trans fats, minimize simple carbs and saturated fats. Increase exercise as tolerated 

## 2019-10-22 NOTE — Assessment & Plan Note (Signed)
Check labs con't meds 

## 2019-11-01 ENCOUNTER — Other Ambulatory Visit: Payer: Self-pay | Admitting: Family Medicine

## 2019-11-01 DIAGNOSIS — E039 Hypothyroidism, unspecified: Secondary | ICD-10-CM

## 2019-11-03 ENCOUNTER — Other Ambulatory Visit: Payer: Self-pay | Admitting: Family Medicine

## 2019-11-03 DIAGNOSIS — E785 Hyperlipidemia, unspecified: Secondary | ICD-10-CM

## 2019-11-20 ENCOUNTER — Other Ambulatory Visit: Payer: Self-pay | Admitting: Family Medicine

## 2019-12-28 ENCOUNTER — Other Ambulatory Visit: Payer: Self-pay | Admitting: Family Medicine

## 2020-01-21 NOTE — Addendum Note (Signed)
Addended by: Kelle Darting A on: 01/21/2020 03:42 PM   Modules accepted: Orders

## 2020-01-27 ENCOUNTER — Other Ambulatory Visit: Payer: Self-pay

## 2020-01-27 ENCOUNTER — Other Ambulatory Visit: Payer: Medicare Other

## 2020-01-27 DIAGNOSIS — E039 Hypothyroidism, unspecified: Secondary | ICD-10-CM

## 2020-01-27 DIAGNOSIS — E785 Hyperlipidemia, unspecified: Secondary | ICD-10-CM | POA: Diagnosis not present

## 2020-01-27 DIAGNOSIS — E1165 Type 2 diabetes mellitus with hyperglycemia: Secondary | ICD-10-CM

## 2020-01-27 DIAGNOSIS — E1169 Type 2 diabetes mellitus with other specified complication: Secondary | ICD-10-CM | POA: Diagnosis not present

## 2020-01-28 LAB — COMPREHENSIVE METABOLIC PANEL
AG Ratio: 2.1 (calc) (ref 1.0–2.5)
ALT: 16 U/L (ref 6–29)
AST: 18 U/L (ref 10–35)
Albumin: 4.7 g/dL (ref 3.6–5.1)
Alkaline phosphatase (APISO): 58 U/L (ref 37–153)
BUN: 13 mg/dL (ref 7–25)
CO2: 28 mmol/L (ref 20–32)
Calcium: 9.3 mg/dL (ref 8.6–10.4)
Chloride: 105 mmol/L (ref 98–110)
Creat: 0.69 mg/dL (ref 0.50–0.99)
Globulin: 2.2 g/dL (calc) (ref 1.9–3.7)
Glucose, Bld: 127 mg/dL — ABNORMAL HIGH (ref 65–99)
Potassium: 4.3 mmol/L (ref 3.5–5.3)
Sodium: 140 mmol/L (ref 135–146)
Total Bilirubin: 0.5 mg/dL (ref 0.2–1.2)
Total Protein: 6.9 g/dL (ref 6.1–8.1)

## 2020-01-28 LAB — LIPID PANEL
Cholesterol: 103 mg/dL (ref <200)
HDL: 43 mg/dL — ABNORMAL LOW (ref >=50)
LDL Cholesterol (Calc): 40 mg/dL (calc)
Non-HDL Cholesterol (Calc): 60 mg/dL (calc) (ref <130)
Total CHOL/HDL Ratio: 2.4 (calc) (ref <5.0)
Triglycerides: 121 mg/dL (ref <150)

## 2020-01-28 LAB — HEMOGLOBIN A1C
Hgb A1c MFr Bld: 6.6 % of total Hgb — ABNORMAL HIGH (ref <5.7)
Mean Plasma Glucose: 143 (calc)
eAG (mmol/L): 7.9 (calc)

## 2020-01-28 LAB — TSH: TSH: 3.29 mIU/L (ref 0.40–4.50)

## 2020-04-24 ENCOUNTER — Other Ambulatory Visit: Payer: Self-pay | Admitting: Family Medicine

## 2020-05-18 ENCOUNTER — Other Ambulatory Visit: Payer: Self-pay | Admitting: Family Medicine

## 2020-06-17 ENCOUNTER — Other Ambulatory Visit: Payer: Self-pay | Admitting: Family Medicine

## 2020-06-17 DIAGNOSIS — E785 Hyperlipidemia, unspecified: Secondary | ICD-10-CM

## 2020-06-26 ENCOUNTER — Other Ambulatory Visit: Payer: Self-pay

## 2020-06-26 ENCOUNTER — Ambulatory Visit (INDEPENDENT_AMBULATORY_CARE_PROVIDER_SITE_OTHER): Payer: Medicare Other | Admitting: Family Medicine

## 2020-06-26 ENCOUNTER — Encounter: Payer: Self-pay | Admitting: Family Medicine

## 2020-06-26 VITALS — BP 104/70 | HR 86 | Temp 98.6°F | Resp 18 | Ht 65.0 in | Wt 211.2 lb

## 2020-06-26 DIAGNOSIS — E039 Hypothyroidism, unspecified: Secondary | ICD-10-CM

## 2020-06-26 DIAGNOSIS — E1165 Type 2 diabetes mellitus with hyperglycemia: Secondary | ICD-10-CM

## 2020-06-26 DIAGNOSIS — R319 Hematuria, unspecified: Secondary | ICD-10-CM

## 2020-06-26 DIAGNOSIS — R350 Frequency of micturition: Secondary | ICD-10-CM

## 2020-06-26 DIAGNOSIS — I10 Essential (primary) hypertension: Secondary | ICD-10-CM | POA: Diagnosis not present

## 2020-06-26 DIAGNOSIS — E785 Hyperlipidemia, unspecified: Secondary | ICD-10-CM | POA: Diagnosis not present

## 2020-06-26 LAB — COMPREHENSIVE METABOLIC PANEL
ALT: 15 U/L (ref 0–35)
AST: 14 U/L (ref 0–37)
Albumin: 4.9 g/dL (ref 3.5–5.2)
Alkaline Phosphatase: 65 U/L (ref 39–117)
BUN: 12 mg/dL (ref 6–23)
CO2: 29 mEq/L (ref 19–32)
Calcium: 9.7 mg/dL (ref 8.4–10.5)
Chloride: 102 mEq/L (ref 96–112)
Creatinine, Ser: 0.69 mg/dL (ref 0.40–1.20)
GFR: 90.51 mL/min (ref >60.00)
Glucose, Bld: 140 mg/dL — ABNORMAL HIGH (ref 70–99)
Potassium: 4.4 mEq/L (ref 3.5–5.1)
Sodium: 139 mEq/L (ref 135–145)
Total Bilirubin: 0.6 mg/dL (ref 0.2–1.2)
Total Protein: 7.3 g/dL (ref 6.0–8.3)

## 2020-06-26 LAB — LIPID PANEL
Cholesterol: 114 mg/dL (ref 0–200)
HDL: 43.2 mg/dL (ref >39.00)
LDL Cholesterol: 43 mg/dL (ref 0–99)
NonHDL: 71.21
Total CHOL/HDL Ratio: 3
Triglycerides: 142 mg/dL (ref 0.0–149.0)
VLDL: 28.4 mg/dL (ref 0.0–40.0)

## 2020-06-26 LAB — CBC WITH DIFFERENTIAL/PLATELET
Basophils Absolute: 0 10*3/uL (ref 0.0–0.1)
Basophils Relative: 0.6 % (ref 0.0–3.0)
Eosinophils Absolute: 0.1 10*3/uL (ref 0.0–0.7)
Eosinophils Relative: 2.3 % (ref 0.0–5.0)
HCT: 40.8 % (ref 36.0–46.0)
Hemoglobin: 13.5 g/dL (ref 12.0–15.0)
Lymphocytes Relative: 40.8 % (ref 12.0–46.0)
Lymphs Abs: 2.5 10*3/uL (ref 0.7–4.0)
MCHC: 33.2 g/dL (ref 30.0–36.0)
MCV: 94 fl (ref 78.0–100.0)
Monocytes Absolute: 0.4 10*3/uL (ref 0.1–1.0)
Monocytes Relative: 7.1 % (ref 3.0–12.0)
Neutro Abs: 3 10*3/uL (ref 1.4–7.7)
Neutrophils Relative %: 49.2 % (ref 43.0–77.0)
Platelets: 209 10*3/uL (ref 150.0–400.0)
RBC: 4.34 Mil/uL (ref 3.87–5.11)
RDW: 12.9 % (ref 11.5–15.5)
WBC: 6.2 10*3/uL (ref 4.0–10.5)

## 2020-06-26 LAB — POC URINALSYSI DIPSTICK (AUTOMATED)
Bilirubin, UA: NEGATIVE
Glucose, UA: NEGATIVE
Ketones, UA: NEGATIVE
Leukocytes, UA: NEGATIVE
Nitrite, UA: NEGATIVE
Protein, UA: POSITIVE — AB
Spec Grav, UA: >=1.03 — AB (ref 1.010–1.025)
Urobilinogen, UA: 0.2 E.U./dL
pH, UA: 6 (ref 5.0–8.0)

## 2020-06-26 LAB — HEMOGLOBIN A1C: Hgb A1c MFr Bld: 6.9 % — ABNORMAL HIGH (ref 4.6–6.5)

## 2020-06-26 LAB — MICROALBUMIN / CREATININE URINE RATIO
Creatinine,U: 121 mg/dL
Microalb Creat Ratio: 1.9 mg/g (ref 0.0–30.0)
Microalb, Ur: 2.3 mg/dL — ABNORMAL HIGH (ref 0.0–1.9)

## 2020-06-26 LAB — TSH: TSH: 2.08 u[IU]/mL (ref 0.35–4.50)

## 2020-06-26 MED ORDER — FENOFIBRATE 54 MG PO TABS
54.0000 mg | ORAL_TABLET | Freq: Every day | ORAL | 1 refills | Status: DC
Start: 2020-06-26 — End: 2020-06-28

## 2020-06-26 MED ORDER — ROSUVASTATIN CALCIUM 10 MG PO TABS: 10.0000 mg | ORAL_TABLET | Freq: Every day | ORAL | 0 refills | Status: DC

## 2020-06-26 MED ORDER — LEVOTHYROXINE SODIUM 112 MCG PO TABS: 112.0000 ug | ORAL_TABLET | Freq: Every day | ORAL | 0 refills | Status: DC

## 2020-06-26 MED ORDER — CARVEDILOL 6.25 MG PO TABS
6.2500 mg | ORAL_TABLET | Freq: Two times a day (BID) | ORAL | 2 refills | Status: DC
Start: 2020-06-26 — End: 2020-06-28

## 2020-06-26 NOTE — Patient Instructions (Signed)

## 2020-06-26 NOTE — Assessment & Plan Note (Signed)
Check labs today.

## 2020-06-26 NOTE — Progress Notes (Signed)
Patient ID: Diane Hodges, female    DOB: 09/07/1955  Age: 65 y.o. MRN: [id number]    Subjective:  Subjective  HPI Diane Hodges presents for an office visit today for f/u bp, chol, dm or hypothyroid.   HYPERTENSION   Blood pressure range-not checking   Chest pain- no      Dyspnea- no Lightheadedness- no Edema- no  Other side effects - no   Medication compliance: good Low salt diet- yes    DIABETES    Blood Sugar ranges-not checking   Polyuria- no New Visual problems- no  Hypoglycemic symptoms- no  Other side effects-no Medication compliance - good Last eye exam- due Foot exam- today   HYPERLIPIDEMIA  Medication compliance- good RUQ pain- no  Muscle aches- no34Other side effects-no       She complains of left ear dryness and itchiness. She states she has been using Triamcinolone cream and Neosporin, however it did not relieved her symptoms.She also complains of weaken bladder. She denies any chest pain, SOB, fever, abdominal pain, cough, chills, sore throat, dysuria, back pain, HA, or N/VD at this time.   She reports that she is has not been taking at home blood sugar level.  Lab Results  Component Value Date   HGBA1C 6.6 (H) 01/27/2020    She denies any chest pain, SOB, fever, abdominal pain, cough, chills, sore throat, dysuria, urinary incontinence, back pain, HA, or N/VD at this time.  Review of Systems  Constitutional: Negative for chills, fatigue and fever.  HENT: Negative for ear pain, sinus pain and sore throat.        (+) left ear dryness and itchiness   Eyes: Negative for pain.  Respiratory: Negative for shortness of breath.   Cardiovascular: Negative for chest pain, palpitations and leg swelling.  Gastrointestinal: Negative for abdominal pain, blood in stool, constipation, diarrhea, nausea and vomiting.  Genitourinary: Negative for dysuria, frequency and hematuria.       (+) weaken bladder  Neurological: Negative for headaches.     History Past Medical History:  Diagnosis Date  . Atypical nevus 04/12/2014   mid back -mild  . Hypertension   . Thyroid disease    Hypothyridism    She has a past surgical history that includes Oophorectomy (1980) and Cataract extraction (Right, 2015).   Her family history includes Hypertension in her father and mother; Thyroid disease in an other family member.She reports that she has never smoked. She has never used smokeless tobacco. She reports that she does not drink alcohol and does not use drugs.  Current Outpatient Medications on File Prior to Visit  Medication Sig Dispense Refill  . blood glucose meter kit and supplies KIT Dispense based on patient and insurance preference. Use up to four times daily as directed. (FOR ICD-9 250.00, 250.01). 1 each 0  . glucose blood test strip Onetouch verio test strips. Check blood sugar once a day 100 each 0  . metFORMIN (GLUCOPHAGE) 500 MG tablet Take 1 tablet (500 mg total) by mouth 2 (two) times daily with a meal. 180 tablet 3  . Omega-3 Fatty Acids (FISH OIL) 1200 MG CAPS Take 1 capsule by mouth daily.    [name] DELICA LANCETS FINE MISC 1 Device by Does not apply route daily. 100 each 0  . triamcinolone cream (KENALOG) 0.1 % APPLY TO AFFECTED AREA EVERY DAY  4  . vitamin E 400 UNIT capsule Take 400 Units by mouth daily.     No current facility-administered  medications on file prior to visit.     Objective:  Objective  Physical Exam Vitals and nursing note reviewed.  Constitutional:      General: She is not in acute distress.    Appearance: Normal appearance. She is well-developed. She is not ill-appearing.  HENT:     Head: Normocephalic and atraumatic.     Comments: There are dry and flaky skin on the left ear.    Right Ear: External ear normal.     Left Ear: External ear normal.     Nose: Nose normal.  Eyes:     Extraocular Movements: Extraocular movements intact.     Pupils: Pupils are equal, round, and reactive to  light.  Cardiovascular:     Rate and Rhythm: Normal rate and regular rhythm.     Pulses: Normal pulses.     Heart sounds: Normal heart sounds. No murmur heard. No friction rub. No gallop.   Pulmonary:     Effort: Pulmonary effort is normal. No respiratory distress.     Breath sounds: Normal breath sounds. No stridor. No wheezing, rhonchi or rales.  Abdominal:     General: Bowel sounds are normal. There is no distension.     Palpations: Abdomen is soft.     Tenderness: There is no abdominal tenderness. There is no guarding.     Hernia: No hernia is present.  Musculoskeletal:        General: Normal range of motion.     Cervical back: Normal range of motion and neck supple.  Skin:    General: Skin is warm and dry.  Neurological:     Mental Status: She is alert and oriented to person, place, and time.  Psychiatric:        Behavior: Behavior normal.        Thought Content: Thought content normal.    Diabetic Foot Exam - Simple   Simple Foot Form Diabetic Foot exam was performed with the following findings: Yes 06/26/2020 12:57 PM  Visual Inspection No deformities, no ulcerations, no other skin breakdown bilaterally: Yes Sensation Testing Intact to touch and monofilament testing bilaterally: Yes Pulse Check Posterior Tibialis and Dorsalis pulse intact bilaterally: Yes Comments     BP 104/70 (BP Location: Right Arm, Patient Position: Sitting, Cuff Size: Large)   Pulse 86   Temp 98.6 F (37 C) (Oral)   Resp 18   Ht 5' 5"  (1.651 m)   Wt 211 lb 3.2 oz (95.8 kg)   SpO2 96%   BMI 35.15 kg/m  Wt Readings from Last 3 Encounters:  06/26/20 211 lb 3.2 oz (95.8 kg)  10/21/19 (!) 211 lb 3.2 oz (95.8 kg)  02/23/18 213 lb (96.6 kg)     Lab Results  Component Value Date   WBC 6.0 08/21/2017   HGB 13.9 08/21/2017   HCT 41.2 08/21/2017   PLT 196.0 08/21/2017   GLUCOSE 127 (H) 01/27/2020   CHOL 103 01/27/2020   TRIG 121 01/27/2020   HDL 43 (L) 01/27/2020   LDLDIRECT 88.0  10/21/2019   LDLCALC 40 01/27/2020   ALT 16 01/27/2020   AST 18 01/27/2020   NA 140 01/27/2020   K 4.3 01/27/2020   CL 105 01/27/2020   CREATININE 0.69 01/27/2020   BUN 13 01/27/2020   CO2 28 01/27/2020   TSH 3.29 01/27/2020   HGBA1C 6.6 (H) 01/27/2020   MICROALBUR 0.9 08/21/2017    No results found.   Assessment & Plan:  Plan    Meds  ordered this encounter  Medications  . levothyroxine (SYNTHROID) 112 MCG tablet    Sig: Take 1 tablet (112 mcg total) by mouth daily before breakfast.    Dispense:  30 tablet    Refill:  0    Requested drug refills are authorized, however, the patient needs further evaluation and/or laboratory testing before further refills are given. Ask her to make an appointment for this.  . rosuvastatin (CRESTOR) 10 MG tablet    Sig: Take 1 tablet (10 mg total) by mouth daily.    Dispense:  30 tablet    Refill:  0    Requested drug refills are authorized, however, the patient needs further evaluation and/or laboratory testing before further refills are given. Ask her to make an appointment for this.  . carvedilol (COREG) 6.25 MG tablet    Sig: Take 1 tablet (6.25 mg total) by mouth 2 (two) times daily with a meal.    Dispense:  180 tablet    Refill:  2  . fenofibrate 54 MG tablet    Sig: Take 1 tablet (54 mg total) by mouth daily.    Dispense:  90 tablet    Refill:  1    Problem List Items Addressed This Visit      Unprioritized   Essential hypertension    Well controlled, no changes to meds. Encouraged heart healthy diet such as the DASH diet and exercise as tolerated.       Relevant Medications   rosuvastatin (CRESTOR) 10 MG tablet   carvedilol (COREG) 6.25 MG tablet   fenofibrate 54 MG tablet   Other Relevant Orders   CBC with Differential/Platelet   Comprehensive metabolic panel   Microalbumin / creatinine urine ratio   Hyperlipidemia    Encouraged heart healthy diet, increase exercise, avoid trans fats, consider a krill oil cap daily       Relevant Medications   rosuvastatin (CRESTOR) 10 MG tablet   carvedilol (COREG) 6.25 MG tablet   fenofibrate 54 MG tablet   Hypothyroidism - Primary    Check labs today       Relevant Medications   levothyroxine (SYNTHROID) 112 MCG tablet   carvedilol (COREG) 6.25 MG tablet   Other Relevant Orders   TSH   CBC with Differential/Platelet   Comprehensive metabolic panel   Uncontrolled type 2 diabetes mellitus with hyperglycemia (HCC)    hgba1c to be checked , minimize simple carbs. Increase exercise as tolerated. Continue current meds      Relevant Medications   rosuvastatin (CRESTOR) 10 MG tablet   Other Relevant Orders   Hemoglobin A1c   Microalbumin / creatinine urine ratio    Other Visit Diagnoses    Dyslipidemia       Relevant Medications   rosuvastatin (CRESTOR) 10 MG tablet   fenofibrate 54 MG tablet   Other Relevant Orders   Lipid panel   Comprehensive metabolic panel   Urinary frequency       Relevant Orders   POCT Urinalysis Dipstick (Automated) (Completed)   Urine Culture   Hematuria, unspecified type       Relevant Orders   Urine Culture      Follow-up: Return in about 6 months (around 12/27/2020), or if symptoms worsen or fail to improve, for annual exam, fasting.   I,Gordon Zheng,acting as a Education administrator for [employer], DO.,have documented all relevant documentation on the behalf of [name], DO,as directed by  [name], DO while in the  presence of [name], DO.  I, [name], DO, have reviewed all documentation for this visit. The documentation on 06/26/20 for the exam, diagnosis, procedures, and orders are all accurate and complete.

## 2020-06-26 NOTE — Assessment & Plan Note (Signed)
Well controlled, no changes to meds. Encouraged heart healthy diet such as the DASH diet and exercise as tolerated.  °

## 2020-06-26 NOTE — Assessment & Plan Note (Signed)
hgba1c to be checked, minimize simple carbs. Increase exercise as tolerated. Continue current meds  

## 2020-06-26 NOTE — Assessment & Plan Note (Signed)
Encouraged heart healthy diet, increase exercise, avoid trans fats, consider a krill oil cap daily 

## 2020-06-27 LAB — URINE CULTURE
MICRO NUMBER:: 11699457
Result:: NO GROWTH
SPECIMEN QUALITY:: ADEQUATE

## 2020-06-28 ENCOUNTER — Telehealth: Payer: Self-pay | Admitting: Family Medicine

## 2020-06-28 ENCOUNTER — Other Ambulatory Visit: Payer: Self-pay | Admitting: Family Medicine

## 2020-06-28 DIAGNOSIS — E785 Hyperlipidemia, unspecified: Secondary | ICD-10-CM

## 2020-06-28 DIAGNOSIS — I10 Essential (primary) hypertension: Secondary | ICD-10-CM

## 2020-06-28 DIAGNOSIS — E039 Hypothyroidism, unspecified: Secondary | ICD-10-CM

## 2020-06-28 MED ORDER — CARVEDILOL 6.25 MG PO TABS: 6.2500 mg | ORAL_TABLET | Freq: Two times a day (BID) | ORAL | 2 refills | Status: DC

## 2020-06-28 MED ORDER — ROSUVASTATIN CALCIUM 10 MG PO TABS: 10.0000 mg | ORAL_TABLET | Freq: Every day | ORAL | 1 refills | Status: DC

## 2020-06-28 MED ORDER — LEVOTHYROXINE SODIUM 112 MCG PO TABS: 112.0000 ug | ORAL_TABLET | Freq: Every day | ORAL | 1 refills | Status: DC

## 2020-06-28 MED ORDER — FENOFIBRATE 54 MG PO TABS: 54.0000 mg | ORAL_TABLET | Freq: Every day | ORAL | 1 refills | Status: DC

## 2020-06-28 NOTE — Telephone Encounter (Signed)
Refills sent

## 2020-06-28 NOTE — Telephone Encounter (Signed)
Patient would like a 90 days supply --- change of pharmacy  levothyroxine (SYNTHROID) 112 MCG tablet [[id number]]   rosuvastatin (CRESTOR) 10 MG tablet [[id number]]   carvedilol (COREG) 6.25 MG tablet [[id number]]   fenofibrate 54 MG tablet [[id number]]   [id number] [address], [place] Phone:  [phone]  Fax:  [phone]

## 2020-06-29 ENCOUNTER — Other Ambulatory Visit: Payer: Self-pay | Admitting: Family Medicine

## 2020-06-29 DIAGNOSIS — E1165 Type 2 diabetes mellitus with hyperglycemia: Secondary | ICD-10-CM

## 2020-10-09 ENCOUNTER — Other Ambulatory Visit: Payer: Medicare Other

## 2020-10-18 ENCOUNTER — Other Ambulatory Visit (INDEPENDENT_AMBULATORY_CARE_PROVIDER_SITE_OTHER): Payer: Medicare Other

## 2020-10-18 ENCOUNTER — Other Ambulatory Visit: Payer: Self-pay

## 2020-10-18 DIAGNOSIS — E1165 Type 2 diabetes mellitus with hyperglycemia: Secondary | ICD-10-CM | POA: Diagnosis not present

## 2020-10-18 LAB — COMPREHENSIVE METABOLIC PANEL
ALT: 16 U/L (ref 0–35)
AST: 15 U/L (ref 0–37)
Albumin: 4.6 g/dL (ref 3.5–5.2)
Alkaline Phosphatase: 62 U/L (ref 39–117)
BUN: 15 mg/dL (ref 6–23)
CO2: 28 mEq/L (ref 19–32)
Calcium: 9.2 mg/dL (ref 8.4–10.5)
Chloride: 102 mEq/L (ref 96–112)
Creatinine, Ser: 0.7 mg/dL (ref 0.40–1.20)
GFR: 90 mL/min (ref >60.00)
Glucose, Bld: 145 mg/dL — ABNORMAL HIGH (ref 70–99)
Potassium: 4.4 mEq/L (ref 3.5–5.1)
Sodium: 138 mEq/L (ref 135–145)
Total Bilirubin: 0.5 mg/dL (ref 0.2–1.2)
Total Protein: 6.8 g/dL (ref 6.0–8.3)

## 2020-10-18 LAB — LIPID PANEL
Cholesterol: 97 mg/dL (ref 0–200)
HDL: 41.7 mg/dL (ref >39.00)
LDL Cholesterol: 29 mg/dL (ref 0–99)
NonHDL: 55.51
Total CHOL/HDL Ratio: 2
Triglycerides: 134 mg/dL (ref 0.0–149.0)
VLDL: 26.8 mg/dL (ref 0.0–40.0)

## 2020-10-18 LAB — HEMOGLOBIN A1C: Hgb A1c MFr Bld: 7 % — ABNORMAL HIGH (ref 4.6–6.5)

## 2020-10-22 ENCOUNTER — Other Ambulatory Visit: Payer: Self-pay | Admitting: Family Medicine

## 2020-10-22 DIAGNOSIS — E1165 Type 2 diabetes mellitus with hyperglycemia: Secondary | ICD-10-CM

## 2020-10-24 ENCOUNTER — Other Ambulatory Visit: Payer: Self-pay | Admitting: Family Medicine

## 2020-10-24 DIAGNOSIS — E785 Hyperlipidemia, unspecified: Secondary | ICD-10-CM

## 2020-10-24 DIAGNOSIS — E039 Hypothyroidism, unspecified: Secondary | ICD-10-CM

## 2020-10-25 ENCOUNTER — Other Ambulatory Visit: Payer: Self-pay

## 2020-10-25 MED ORDER — METFORMIN HCL 500 MG PO TABS: 1000.0000 mg | ORAL_TABLET | Freq: Two times a day (BID) | ORAL | 2 refills | Status: DC

## 2020-11-16 ENCOUNTER — Telehealth: Payer: Self-pay | Admitting: Family Medicine

## 2020-11-16 NOTE — Telephone Encounter (Signed)
Left message for patient to call back and schedule Medicare Annual Wellness Visit (AWV) in office.   If not able to come in office, please offer to do virtually or by telephone.  Left office number and my jabber #[phone].  Due for AWVI  Please schedule at anytime with Nurse Health Advisor.   

## 2020-11-17 ENCOUNTER — Other Ambulatory Visit: Payer: Self-pay | Admitting: Family Medicine

## 2020-11-17 NOTE — Telephone Encounter (Signed)
Just in 10/25/20

## 2021-02-07 ENCOUNTER — Other Ambulatory Visit (INDEPENDENT_AMBULATORY_CARE_PROVIDER_SITE_OTHER): Payer: Medicare Other

## 2021-02-07 ENCOUNTER — Other Ambulatory Visit: Payer: Self-pay

## 2021-02-07 DIAGNOSIS — E1165 Type 2 diabetes mellitus with hyperglycemia: Secondary | ICD-10-CM | POA: Diagnosis not present

## 2021-02-07 LAB — COMPREHENSIVE METABOLIC PANEL
ALT: 15 U/L (ref 0–35)
AST: 17 U/L (ref 0–37)
Albumin: 4.7 g/dL (ref 3.5–5.2)
Alkaline Phosphatase: 67 U/L (ref 39–117)
BUN: 13 mg/dL (ref 6–23)
CO2: 30 mEq/L (ref 19–32)
Calcium: 9.4 mg/dL (ref 8.4–10.5)
Chloride: 103 mEq/L (ref 96–112)
Creatinine, Ser: 0.69 mg/dL (ref 0.40–1.20)
GFR: 90.12 mL/min (ref >60.00)
Glucose, Bld: 125 mg/dL — ABNORMAL HIGH (ref 70–99)
Potassium: 4.5 mEq/L (ref 3.5–5.1)
Sodium: 139 mEq/L (ref 135–145)
Total Bilirubin: 0.5 mg/dL (ref 0.2–1.2)
Total Protein: 7.2 g/dL (ref 6.0–8.3)

## 2021-02-07 LAB — LIPID PANEL
Cholesterol: 113 mg/dL (ref 0–200)
HDL: 41.6 mg/dL (ref >39.00)
NonHDL: 71.79
Total CHOL/HDL Ratio: 3
Triglycerides: 208 mg/dL — ABNORMAL HIGH (ref 0.0–149.0)
VLDL: 41.6 mg/dL — ABNORMAL HIGH (ref 0.0–40.0)

## 2021-02-07 LAB — LDL CHOLESTEROL, DIRECT: Direct LDL: 47 mg/dL

## 2021-02-07 LAB — HEMOGLOBIN A1C: Hgb A1c MFr Bld: 7.2 % — ABNORMAL HIGH (ref 4.6–6.5)

## 2021-02-08 ENCOUNTER — Ambulatory Visit (INDEPENDENT_AMBULATORY_CARE_PROVIDER_SITE_OTHER): Payer: Medicare Other

## 2021-02-08 VITALS — Ht 65.0 in | Wt 211.0 lb

## 2021-02-08 DIAGNOSIS — Z0000 Encounter for general adult medical examination without abnormal findings: Secondary | ICD-10-CM

## 2021-02-08 NOTE — Progress Notes (Signed)
Subjective:   Diane Hodges is a 65 y.o. female who presents for an Initial Medicare Annual Wellness Visit.  I connected with Zonnique today by telephone and verified that I am speaking with the correct person using two identifiers. Location patient: home Location provider: work Persons participating in the virtual visit: patient, Marine scientist.    I discussed the limitations, risks, security and privacy concerns of performing an evaluation and management service by telephone and the availability of in person appointments. I also discussed with the patient that there may be a patient responsible charge related to this service. The patient expressed understanding and verbally consented to this telephonic visit.    Interactive audio and video telecommunications were attempted between this provider and patient, however failed, due to patient having technical difficulties OR patient did not have access to video capability.  We continued and completed visit with audio only.  Some vital signs may be absent or patient reported.   Time Spent with patient on telephone encounter: 30 minutes   Review of Systems     Cardiac Risk Factors include: advanced age (>75mn, >>80women);diabetes mellitus;dyslipidemia;hypertension;obesity (BMI >30kg/m2)     Objective:    Today's Vitals   02/08/21 1101  Weight: 211 lb (95.7 kg)  Height: _0  (1.651 m)   Body mass index is 35.11 kg/m.  Advanced Directives 02/08/2021  Does Patient Have a Medical Advance Directive? No  Would patient like information on creating a medical advance directive? Yes (MAU/Ambulatory/Procedural Areas - Information given)    Current Medications (verified) Outpatient Encounter Medications as of 02/08/2021  Medication Sig   blood glucose meter kit and supplies KIT Dispense based on patient and insurance preference. Use up to four times daily as directed. (FOR ICD-9 250.00, 250.01).   carvedilol (COREG) 6.25 MG tablet Take 1 tablet  (6.25 mg total) by mouth 2 (two) times daily with a meal.   fenofibrate 54 MG tablet Take 1 tablet (54 mg total) by mouth daily.   glucose blood test strip Onetouch verio test strips. Check blood sugar once a day   levothyroxine (SYNTHROID) 112 MCG tablet TAKE 1 TABLET BY MOUTH DAILY BEFORE BREAKFAST.   Omega-3 Fatty Acids (FISH OIL) 1200 MG CAPS Take 1 capsule by mouth daily.   ONETOUCH DELICA LANCETS FINE MISC 1 Device by Does not apply route daily.   rosuvastatin (CRESTOR) 10 MG tablet TAKE 1 TABLET BY MOUTH EVERY DAY   triamcinolone cream (KENALOG) 0.1 % APPLY TO AFFECTED AREA EVERY DAY   vitamin E 400 UNIT capsule Take 400 Units by mouth daily.   metFORMIN (GLUCOPHAGE) 500 MG tablet Take 2 tablets (1,000 mg total) by mouth 2 (two) times daily with a meal. (Patient not taking: Reported on 02/08/2021)   No facility-administered encounter medications on file as of 02/08/2021.    Allergies (verified) Azithromycin   History: Past Medical History:  Diagnosis Date   Atypical nevus 04/12/2014   mid back -mild   Hypertension    Thyroid disease    Hypothyridism   Past Surgical History:  Procedure Laterality Date   CATARACT EXTRACTION Right 2015   OOPHORECTOMY  1980   Family History  Problem Relation Age of Onset   Hypertension Mother    Hypertension Father    Thyroid disease Other    Social History   Socioeconomic History   Marital status: Married    Spouse name: Not on file   Number of children: Not on file   Years of education: Not  on file   Highest education level: Not on file  Occupational History   Occupation: credit reporting    Employer: [employer]  Tobacco Use   Smoking status: Never   Smokeless tobacco: Never  Substance and Sexual Activity   Alcohol use: No   Drug use: No   Sexual activity: Yes    Partners: Male  Other Topics Concern   Not on file  Social History Narrative   Exercise-- none   Social Determinants of Health   Financial Resource  Strain: Low Risk    Difficulty of Paying Living Expenses: Not hard at all  Food Insecurity: No Food Insecurity   Worried About Charity fundraiser in the Last Year: Never true   [place] in the Last Year: Never true  Transportation Needs: No Transportation Needs   Lack of Transportation (Medical): No   Lack of Transportation (Non-Medical): No  Physical Activity: Inactive   Days of Exercise per Week: 0 days   Minutes of Exercise per Session: 0 min  Stress: No Stress Concern Present   Feeling of Stress : Not at all  Social Connections: Socially Integrated   Frequency of Communication with Friends and Family: More than three times a week   Frequency of Social Gatherings with Friends and Family: More than three times a week   Attends Religious Services: More than 4 times per year   Active Member of [employer] or Organizations: Yes   Attends Music therapist: More than 4 times per year   Marital Status: Married    Tobacco Counseling Counseling given: Not Answered   Clinical Intake:  Pre-visit preparation completed: Yes  Pain : No/denies pain     BMI - recorded: 35.11 Nutritional Status: BMI > 30  Obese Nutritional Risks: None Diabetes: Yes CBG done?: No Did pt. bring in CBG monitor from home?: No (phone visit)  How often do you need to have someone help you when you read instructions, pamphlets, or other written materials from your doctor or pharmacy?: 1 - Never  Diabetes:  Is the patient diabetic?  Yes  If diabetic, was a CBG obtained today?  No  Did the patient bring in their glucometer from home?  No phone visit How often do you monitor your CBG's? never.   Financial Strains and Diabetes Management:  Are you having any financial strains with the device, your supplies or your medication? No .  Does the patient want to be seen by Chronic Care Management for management of their diabetes?  No  Would the patient like to be referred to a Nutritionist or  for Diabetic Management?  No   Diabetic Exams:  Diabetic Eye Exam:. Overdue for diabetic eye exam. Pt has been advised about the importance in completing this exam. Patient advised to make an appt.  Diabetic Foot Exam: Completed 06/26/2020.    Interpreter Needed?: No  Information entered by :: Caroleen Hamman LPN   Activities of Daily Living In your present state of health, do you have any difficulty performing the following activities: 02/08/2021  Hearing? N  Vision? N  Difficulty concentrating or making decisions? N  Walking or climbing stairs? N  Dressing or bathing? N  Doing errands, shopping? N  Preparing Food and eating ? N  Using the Toilet? N  In the past six months, have you accidently leaked urine? N  Do you have problems with loss of bowel control? N  Managing your Medications? N  Managing your  Finances? N  Housekeeping or managing your Housekeeping? N  Some recent data might be hidden    Patient Care Team: Carollee Herter, Alferd Apa, DO as PCP - General Maisie Fus, MD as Consulting Physician (Obstetrics and Gynecology) Lavonna Monarch, MD as Consulting Physician (Dermatology) Latanya Maudlin, MD as Consulting Physician (Orthopedic Surgery)  Indicate any recent Medical Services you may have received from other than [facility] providers in the past year (date may be approximate).     Assessment:   This is a routine wellness examination for [facility]  Hearing/Vision screen Hearing Screening - Comments:: No issues Vision Screening - Comments:: Last eye exam-2 years ago-[facility]  Annette Stable issues and exercise activities discussed: Current Exercise Habits: Home exercise routine;The patient does not participate in regular exercise at present, Exercise limited by: None identified   Goals Addressed             This Visit's Progress    Patient Stated       Control diabetes with diet       Depression Screen PHQ 2/9 Scores 02/08/2021 10/21/2019 02/23/2018 09/11/2015  02/19/2013  PHQ - 2 Score 0 0 0 0 1  PHQ- 9 Score - - 2 - -    Fall Risk Fall Risk  02/08/2021  Falls in the past year? 0  Number falls in past yr: 0  Injury with Fall? 0  Follow up Falls prevention discussed    FALL RISK PREVENTION PERTAINING TO THE HOME:  Any stairs in or around the home? Yes  If so, are there any without handrails? No  Home free of loose throw rugs in walkways, pet beds, electrical cords, etc? Yes  Adequate lighting in your home to reduce risk of falls? Yes   ASSISTIVE DEVICES UTILIZED TO PREVENT FALLS:  Life alert? No  Use of a cane, walker or w/c? No  Grab bars in the bathroom? Yes  Shower chair or bench in shower? No  Elevated toilet seat or a handicapped toilet? No   TIMED UP AND GO:  Was the test performed? No . Phone visit   Cognitive Function:Normal cognitive status assessed by this Nurse Health Advisor. No abnormalities found.          Immunizations Immunization History  Administered Date(s) Administered   Td 08/12/2001   Tdap 11/14/2011    TDAP status: Up to date  Flu Vaccine status: Declined, Education has been provided regarding the importance of this vaccine but patient still declined. Advised may receive this vaccine at local pharmacy or Health Dept. Aware to provide a copy of the vaccination record if obtained from local pharmacy or Health Dept. Verbalized acceptance and understanding.  Pneumococcal vaccine status: Declined,  Education has been provided regarding the importance of this vaccine but patient still declined. Advised may receive this vaccine at local pharmacy or Health Dept. Aware to provide a copy of the vaccination record if obtained from local pharmacy or Health Dept. Verbalized acceptance and understanding.   Covid-19 vaccine status: Declined, Education has been provided regarding the importance of this vaccine but patient still declined. Advised may receive this vaccine at local pharmacy or Health Dept.or vaccine  clinic. Aware to provide a copy of the vaccination record if obtained from local pharmacy or Health Dept. Verbalized acceptance and understanding.  Qualifies for Shingles Vaccine? Yes   Zostavax completed No   Shingrix Completed?: No.    Education has been provided regarding the importance of this vaccine. Patient has been advised to call  insurance company to determine out of pocket expense if they have not yet received this vaccine. Advised may also receive vaccine at local pharmacy or Health Dept. Verbalized acceptance and understanding.  Screening Tests Health Maintenance  Topic Date Due   COVID-19 Vaccine (1) Never done   Pneumonia Vaccine 31+ Years old (1 - PCV) Never done   Zoster Vaccines- Shingrix (1 of 2) Never done   MAMMOGRAM  04/10/2015   COLONOSCOPY (Pts 45-34yr Insurance coverage will need to be confirmed)  12/17/2015   OPHTHALMOLOGY EXAM  04/17/2017   DEXA SCAN  Never done   INFLUENZA VACCINE  Never done   FOOT EXAM  06/26/2021   URINE MICROALBUMIN  06/26/2021   HEMOGLOBIN A1C  08/07/2021   TETANUS/TDAP  11/13/2021   Hepatitis C Screening  Completed   HPV VACCINES  Aged Out    Health Maintenance  Health Maintenance Due  Topic Date Due   COVID-19 Vaccine (1) Never done   Pneumonia Vaccine 67 Years old (1 - PCV) Never done   Zoster Vaccines- Shingrix (1 of 2) Never done   MAMMOGRAM  04/10/2015   COLONOSCOPY (Pts 45-491yrInsurance coverage will need to be confirmed)  12/17/2015   OPHTHALMOLOGY EXAM  04/17/2017   DEXA SCAN  Never done   INFLUENZA VACCINE  Never done   Colorectal cancer screening: Patient states she has Cologuard kit at home.  Mammogram status: Patient states she will call to schedule.  Bone Density status: Declined.  Lung Cancer Screening: (Low Dose CT Chest recommended if Age 67-80ears, 30 pack-year currently smoking OR have quit w/in 15years.) does not qualify.     Additional Screening:  Hepatitis C Screening:  Completed  06/29/2015  Vision Screening: Recommended annual ophthalmology exams for early detection of glaucoma and other disorders of the eye. Is the patient up to date with their annual eye exam?  No  Who is the provider or what is the name of the office in which the patient attends annual eye exams? [facility]   Dental Screening: Recommended annual dental exams for proper oral hygiene  Community Resource Referral / Chronic Care Management: CRR required this visit?  No   CCM required this visit?  No      Plan:     I have personally reviewed and noted the following in the patient's chart:   Medical and social history Use of alcohol, tobacco or illicit drugs  Current medications and supplements including opioid prescriptions. Patient is not currently taking opioid prescriptions. Functional ability and status Nutritional status Physical activity Advanced directives List of other physicians Hospitalizations, surgeries, and ER visits in previous 12 months Vitals Screenings to include cognitive, depression, and falls Referrals and appointments  In addition, I have reviewed and discussed with patient certain preventive protocols, quality metrics, and best practice recommendations. A written personalized care plan for preventive services as well as general preventive health recommendations were provided to patient.   Due to this being a telephonic visit, the after visit summary with patients personalized plan was offered to patient via mail or my-chart. Patient would like to access on my-chart.   MaMarta AntuLPN   [id number] Health Advisor  Nurse Notes: None

## 2021-02-08 NOTE — Patient Instructions (Signed)
Diane Hodges , Thank you for taking time to complete your Medicare Wellness Visit. I appreciate your ongoing commitment to your health goals. Please review the following plan we discussed and let me know if I can assist you in the future.   Screening recommendations/referrals: Colonoscopy: Per our conversation, you have Cologuard kit at home. Mammogram: Due-per our conversation, you will schedule. Bone Density: Completed at GYN office. Please have copy of results sent to PCP. Recommended yearly ophthalmology/optometry visit for glaucoma screening and checkup Recommended yearly dental visit for hygiene and checkup  Vaccinations: Influenza vaccine: Declined Pneumococcal vaccine: Declined Tdap vaccine: Up to date-Due-11/13/2021 Shingles vaccine: Declined   Covid-19:Declined  Advanced directives: Information mailed  Conditions/risks identified: See problem list  Next appointment: Follow up in one year for your annual wellness visit 02/11/2022 @ 11:40   Preventive Care 65 Years and Older, Female Preventive care refers to lifestyle choices and visits with your health care provider that can promote health and wellness. What does preventive care include? A yearly physical exam. This is also called an annual well check. Dental exams once or twice a year. Routine eye exams. Ask your health care provider how often you should have your eyes checked. Personal lifestyle choices, including: Daily care of your teeth and gums. Regular physical activity. Eating a healthy diet. Avoiding tobacco and drug use. Limiting alcohol use. Practicing safe sex. Taking low-dose aspirin every day. Taking vitamin and mineral supplements as recommended by your health care provider. What happens during an annual well check? The services and screenings done by your health care provider during your annual well check will depend on your age, overall health, lifestyle risk factors, and family history of  disease. Counseling  Your health care provider may ask you questions about your: Alcohol use. Tobacco use. Drug use. Emotional well-being. Home and relationship well-being. Sexual activity. Eating habits. History of falls. Memory and ability to understand (cognition). Work and work Statistician. Reproductive health. Screening  You may have the following tests or measurements: Height, weight, and BMI. Blood pressure. Lipid and cholesterol levels. These may be checked every 5 years, or more frequently if you are over 43 years old. Skin check. Lung cancer screening. You may have this screening every year starting at age 44 if you have a 30-pack-year history of smoking and currently smoke or have quit within the past 15 years. Fecal occult blood test (FOBT) of the stool. You may have this test every year starting at age 41. Flexible sigmoidoscopy or colonoscopy. You may have a sigmoidoscopy every 5 years or a colonoscopy every 10 years starting at age 64. Hepatitis C blood test. Hepatitis B blood test. Sexually transmitted disease (STD) testing. Diabetes screening. This is done by checking your blood sugar (glucose) after you have not eaten for a while (fasting). You may have this done every 1-3 years. Bone density scan. This is done to screen for osteoporosis. You may have this done starting at age 27. Mammogram. This may be done every 1-2 years. Talk to your health care provider about how often you should have regular mammograms. Talk with your health care provider about your test results, treatment options, and if necessary, the need for more tests. Vaccines  Your health care provider may recommend certain vaccines, such as: Influenza vaccine. This is recommended every year. Tetanus, diphtheria, and acellular pertussis (Tdap, Td) vaccine. You may need a Td booster every 10 years. Zoster vaccine. You may need this after age 97. Pneumococcal 13-valent conjugate (PCV13) vaccine.  One  dose is recommended after age 6. Pneumococcal polysaccharide (PPSV23) vaccine. One dose is recommended after age 77. Talk to your health care provider about which screenings and vaccines you need and how often you need them. This information is not intended to replace advice given to you by your health care provider. Make sure you discuss any questions you have with your health care provider. Document Released: 04/14/2015 Document Revised: 12/06/2015 Document Reviewed: 01/17/2015 Elsevier Interactive Patient Education  2017 [place] Prevention in the Home Falls can cause injuries. They can happen to people of all ages. There are many things you can do to make your home safe and to help prevent falls. What can I do on the outside of my home? Regularly fix the edges of walkways and driveways and fix any cracks. Remove anything that might make you trip as you walk through a door, such as a raised step or threshold. Trim any bushes or trees on the path to your home. Use bright outdoor lighting. Clear any walking paths of anything that might make someone trip, such as rocks or tools. Regularly check to see if handrails are loose or broken. Make sure that both sides of any steps have handrails. Any raised decks and porches should have guardrails on the edges. Have any leaves, snow, or ice cleared regularly. Use sand or salt on walking paths during winter. Clean up any spills in your garage right away. This includes oil or grease spills. What can I do in the bathroom? Use night lights. Install grab bars by the toilet and in the tub and shower. Do not use towel bars as grab bars. Use non-skid mats or decals in the tub or shower. If you need to sit down in the shower, use a plastic, non-slip stool. Keep the floor dry. Clean up any water that spills on the floor as soon as it happens. Remove soap buildup in the tub or shower regularly. Attach bath mats securely with double-sided  non-slip rug tape. Do not have throw rugs and other things on the floor that can make you trip. What can I do in the bedroom? Use night lights. Make sure that you have a light by your bed that is easy to reach. Do not use any sheets or blankets that are too big for your bed. They should not hang down onto the floor. Have a firm chair that has side arms. You can use this for support while you get dressed. Do not have throw rugs and other things on the floor that can make you trip. What can I do in the kitchen? Clean up any spills right away. Avoid walking on wet floors. Keep items that you use a lot in easy-to-reach places. If you need to reach something above you, use a strong step stool that has a grab bar. Keep electrical cords out of the way. Do not use floor polish or wax that makes floors slippery. If you must use wax, use non-skid floor wax. Do not have throw rugs and other things on the floor that can make you trip. What can I do with my stairs? Do not leave any items on the stairs. Make sure that there are handrails on both sides of the stairs and use them. Fix handrails that are broken or loose. Make sure that handrails are as long as the stairways. Check any carpeting to make sure that it is firmly attached to the stairs. Fix any carpet that is loose or  worn. Avoid having throw rugs at the top or bottom of the stairs. If you do have throw rugs, attach them to the floor with carpet tape. Make sure that you have a light switch at the top of the stairs and the bottom of the stairs. If you do not have them, ask someone to add them for you. What else can I do to help prevent falls? Wear shoes that: Do not have high heels. Have rubber bottoms. Are comfortable and fit you well. Are closed at the toe. Do not wear sandals. If you use a stepladder: Make sure that it is fully opened. Do not climb a closed stepladder. Make sure that both sides of the stepladder are locked into place. Ask  someone to hold it for you, if possible. Clearly mark and make sure that you can see: Any grab bars or handrails. First and last steps. Where the edge of each step is. Use tools that help you move around (mobility aids) if they are needed. These include: Canes. Walkers. Scooters. Crutches. Turn on the lights when you go into a dark area. Replace any light bulbs as soon as they burn out. Set up your furniture so you have a clear path. Avoid moving your furniture around. If any of your floors are uneven, fix them. If there are any pets around you, be aware of where they are. Review your medicines with your doctor. Some medicines can make you feel dizzy. This can increase your chance of falling. Ask your doctor what other things that you can do to help prevent falls. This information is not intended to replace advice given to you by your health care provider. Make sure you discuss any questions you have with your health care provider. Document Released: 01/12/2009 Document Revised: 08/24/2015 Document Reviewed: 04/22/2014 Elsevier Interactive Patient Education  2017 [employer].

## 2021-02-16 ENCOUNTER — Other Ambulatory Visit: Payer: Self-pay | Admitting: Family Medicine

## 2021-02-16 DIAGNOSIS — E785 Hyperlipidemia, unspecified: Secondary | ICD-10-CM

## 2021-05-10 ENCOUNTER — Other Ambulatory Visit: Payer: Self-pay | Admitting: Family Medicine

## 2021-05-10 DIAGNOSIS — E785 Hyperlipidemia, unspecified: Secondary | ICD-10-CM

## 2021-05-14 ENCOUNTER — Other Ambulatory Visit: Payer: Self-pay | Admitting: Family Medicine

## 2021-05-14 DIAGNOSIS — I10 Essential (primary) hypertension: Secondary | ICD-10-CM

## 2021-06-01 ENCOUNTER — Other Ambulatory Visit: Payer: Self-pay | Admitting: Family Medicine

## 2021-06-01 DIAGNOSIS — E039 Hypothyroidism, unspecified: Secondary | ICD-10-CM

## 2021-06-01 DIAGNOSIS — E785 Hyperlipidemia, unspecified: Secondary | ICD-10-CM

## 2021-06-25 ENCOUNTER — Other Ambulatory Visit: Payer: Self-pay | Admitting: Family Medicine

## 2021-06-25 DIAGNOSIS — E785 Hyperlipidemia, unspecified: Secondary | ICD-10-CM

## 2021-06-29 ENCOUNTER — Other Ambulatory Visit: Payer: Self-pay | Admitting: Family Medicine

## 2021-06-29 DIAGNOSIS — E039 Hypothyroidism, unspecified: Secondary | ICD-10-CM

## 2021-07-18 ENCOUNTER — Other Ambulatory Visit: Payer: Self-pay | Admitting: Family Medicine

## 2021-07-18 DIAGNOSIS — E785 Hyperlipidemia, unspecified: Secondary | ICD-10-CM

## 2021-07-29 ENCOUNTER — Other Ambulatory Visit: Payer: Self-pay | Admitting: Family Medicine

## 2021-07-29 DIAGNOSIS — E039 Hypothyroidism, unspecified: Secondary | ICD-10-CM

## 2021-07-30 ENCOUNTER — Ambulatory Visit (INDEPENDENT_AMBULATORY_CARE_PROVIDER_SITE_OTHER): Payer: Medicare Other | Admitting: Family Medicine

## 2021-07-30 ENCOUNTER — Encounter: Payer: Self-pay | Admitting: Family Medicine

## 2021-07-30 VITALS — BP 118/70 | HR 77 | Temp 97.9°F | Resp 18 | Ht 65.0 in | Wt 213.0 lb

## 2021-07-30 DIAGNOSIS — Z0000 Encounter for general adult medical examination without abnormal findings: Secondary | ICD-10-CM | POA: Diagnosis not present

## 2021-07-30 DIAGNOSIS — E785 Hyperlipidemia, unspecified: Secondary | ICD-10-CM

## 2021-07-30 DIAGNOSIS — E1165 Type 2 diabetes mellitus with hyperglycemia: Secondary | ICD-10-CM

## 2021-07-30 DIAGNOSIS — E039 Hypothyroidism, unspecified: Secondary | ICD-10-CM

## 2021-07-30 DIAGNOSIS — I10 Essential (primary) hypertension: Secondary | ICD-10-CM | POA: Diagnosis not present

## 2021-07-30 LAB — COMPREHENSIVE METABOLIC PANEL
ALT: 17 U/L (ref 0–35)
AST: 17 U/L (ref 0–37)
Albumin: 4.7 g/dL (ref 3.5–5.2)
Alkaline Phosphatase: 61 U/L (ref 39–117)
BUN: 12 mg/dL (ref 6–23)
CO2: 27 mEq/L (ref 19–32)
Calcium: 9.3 mg/dL (ref 8.4–10.5)
Chloride: 106 mEq/L (ref 96–112)
Creatinine, Ser: 0.7 mg/dL (ref 0.40–1.20)
GFR: 89.51 mL/min (ref >60.00)
Glucose, Bld: 134 mg/dL — ABNORMAL HIGH (ref 70–99)
Potassium: 4.3 mEq/L (ref 3.5–5.1)
Sodium: 142 mEq/L (ref 135–145)
Total Bilirubin: 0.5 mg/dL (ref 0.2–1.2)
Total Protein: 7.2 g/dL (ref 6.0–8.3)

## 2021-07-30 LAB — LIPID PANEL
Cholesterol: 101 mg/dL (ref 0–200)
HDL: 44.5 mg/dL (ref >39.00)
LDL Cholesterol: 33 mg/dL (ref 0–99)
NonHDL: 56.55
Total CHOL/HDL Ratio: 2
Triglycerides: 117 mg/dL (ref 0.0–149.0)
VLDL: 23.4 mg/dL (ref 0.0–40.0)

## 2021-07-30 LAB — CBC WITH DIFFERENTIAL/PLATELET
Basophils Absolute: 0 10*3/uL (ref 0.0–0.1)
Basophils Relative: 0.8 % (ref 0.0–3.0)
Eosinophils Absolute: 0.1 10*3/uL (ref 0.0–0.7)
Eosinophils Relative: 1.7 % (ref 0.0–5.0)
HCT: 40.6 % (ref 36.0–46.0)
Hemoglobin: 13.6 g/dL (ref 12.0–15.0)
Lymphocytes Relative: 44.4 % (ref 12.0–46.0)
Lymphs Abs: 2.5 10*3/uL (ref 0.7–4.0)
MCHC: 33.4 g/dL (ref 30.0–36.0)
MCV: 93.7 fl (ref 78.0–100.0)
Monocytes Absolute: 0.4 10*3/uL (ref 0.1–1.0)
Monocytes Relative: 7.5 % (ref 3.0–12.0)
Neutro Abs: 2.6 10*3/uL (ref 1.4–7.7)
Neutrophils Relative %: 45.6 % (ref 43.0–77.0)
Platelets: 185 10*3/uL (ref 150.0–400.0)
RBC: 4.33 Mil/uL (ref 3.87–5.11)
RDW: 12.7 % (ref 11.5–15.5)
WBC: 5.6 10*3/uL (ref 4.0–10.5)

## 2021-07-30 LAB — MICROALBUMIN / CREATININE URINE RATIO
Creatinine,U: 131.5 mg/dL
Microalb Creat Ratio: 1.5 mg/g (ref 0.0–30.0)
Microalb, Ur: 2 mg/dL — ABNORMAL HIGH (ref 0.0–1.9)

## 2021-07-30 LAB — TSH: TSH: 1.6 u[IU]/mL (ref 0.35–5.50)

## 2021-07-30 LAB — HEMOGLOBIN A1C: Hgb A1c MFr Bld: 7 % — ABNORMAL HIGH (ref 4.6–6.5)

## 2021-07-30 MED ORDER — CARVEDILOL 6.25 MG PO TABS: 6.2500 mg | ORAL_TABLET | Freq: Two times a day (BID) | ORAL | 1 refills | Status: DC

## 2021-07-30 MED ORDER — ROSUVASTATIN CALCIUM 10 MG PO TABS: ORAL_TABLET | ORAL | 2 refills | Status: DC

## 2021-07-30 NOTE — Assessment & Plan Note (Signed)
ghm utd Check labs  See avs  

## 2021-07-30 NOTE — Assessment & Plan Note (Signed)
Check labs  hgba1c to be checked, minimize simple carbs. Increase exercise as tolerated. Continue current meds  

## 2021-07-30 NOTE — Patient Instructions (Signed)
Preventive Care 7 Years and Older, Female ?Preventive care refers to lifestyle choices and visits with your health care provider that can promote health and wellness. Preventive care visits are also called wellness exams. ?What can I expect for my preventive care visit? ?Counseling ?Your health care provider may ask you questions about your: ?Medical history, including: ?Past medical problems. ?Family medical history. ?Pregnancy and menstrual history. ?History of falls. ?Current health, including: ?Memory and ability to understand (cognition). ?Emotional well-being. ?Home life and relationship well-being. ?Sexual activity and sexual health. ?Lifestyle, including: ?Alcohol, nicotine or tobacco, and drug use. ?Access to firearms. ?Diet, exercise, and sleep habits. ?Work and work Statistician. ?Sunscreen use. ?Safety issues such as seatbelt and bike helmet use. ?Physical exam ?Your health care provider will check your: ?Height and weight. These may be used to calculate your BMI (body mass index). BMI is a measurement that tells if you are at a healthy weight. ?Waist circumference. This measures the distance around your waistline. This measurement also tells if you are at a healthy weight and may help predict your risk of certain diseases, such as type 2 diabetes and high blood pressure. ?Heart rate and blood pressure. ?Body temperature. ?Skin for abnormal spots. ?What immunizations do I need? ? ?Vaccines are usually given at various ages, according to a schedule. Your health care provider will recommend vaccines for you based on your age, medical history, and lifestyle or other factors, such as travel or where you work. ?What tests do I need? ?Screening ?Your health care provider may recommend screening tests for certain conditions. This may include: ?Lipid and cholesterol levels. ?Hepatitis C test. ?Hepatitis B test. ?HIV (human immunodeficiency virus) test. ?STI (sexually transmitted infection) testing, if you are at  risk. ?Lung cancer screening. ?Colorectal cancer screening. ?Diabetes screening. This is done by checking your blood sugar (glucose) after you have not eaten for a while (fasting). ?Mammogram. Talk with your health care provider about how often you should have regular mammograms. ?BRCA-related cancer screening. This may be done if you have a family history of breast, ovarian, tubal, or peritoneal cancers. ?Bone density scan. This is done to screen for osteoporosis. ?Talk with your health care provider about your test results, treatment options, and if necessary, the need for more tests. ?Follow these instructions at home: ?Eating and drinking ? ?Eat a diet that includes fresh fruits and vegetables, whole grains, lean protein, and low-fat dairy products. Limit your intake of foods with high amounts of sugar, saturated fats, and salt. ?Take vitamin and mineral supplements as recommended by your health care provider. ?Do not drink alcohol if your health care provider tells you not to drink. ?If you drink alcohol: ?Limit how much you have to 0-1 drink a day. ?Know how much alcohol is in your drink. In the [place] one drink equals one 12 oz bottle of beer (355 mL), one 5 oz glass of wine (148 mL), or one 1? oz glass of hard liquor (44 mL). ?Lifestyle ?Brush your teeth every morning and night with fluoride toothpaste. Floss one time each day. ?Exercise for at least 30 minutes 5 or more days each week. ?Do not use any products that contain nicotine or tobacco. These products include cigarettes, chewing tobacco, and vaping devices, such as e-cigarettes. If you need help quitting, ask your health care provider. ?Do not use drugs. ?If you are sexually active, practice safe sex. Use a condom or other form of protection in order to prevent STIs. ?Take aspirin only as told by  your health care provider. Make sure that you understand how much to take and what form to take. Work with your health care provider to find out whether it  is safe and beneficial for you to take aspirin daily. ?Ask your health care provider if you need to take a cholesterol-lowering medicine (statin). ?Find healthy ways to manage stress, such as: ?Meditation, yoga, or listening to music. ?Journaling. ?Talking to a trusted person. ?Spending time with friends and family. ?Minimize exposure to UV radiation to reduce your risk of skin cancer. ?Safety ?Always wear your seat belt while driving or riding in a vehicle. ?Do not drive: ?If you have been drinking alcohol. Do not ride with someone who has been drinking. ?When you are tired or distracted. ?While texting. ?If you have been using any mind-altering substances or drugs. ?Wear a helmet and other protective equipment during sports activities. ?If you have firearms in your house, make sure you follow all gun safety procedures. ?What's next? ?Visit your health care provider once a year for an annual wellness visit. ?Ask your health care provider how often you should have your eyes and teeth checked. ?Stay up to date on all vaccines. ?This information is not intended to replace advice given to you by your health care provider. Make sure you discuss any questions you have with your health care provider. ?Document Revised: 09/13/2020 Document Reviewed: 09/13/2020 ?Elsevier Patient Education ? [place]. ? ?

## 2021-07-30 NOTE — Assessment & Plan Note (Signed)
Check labs 

## 2021-07-30 NOTE — Progress Notes (Signed)
? ?Subjective:  ? ?By signing my name below, I, Carylon Perches, attest that this documentation has been prepared under the direction and in the presence of Roma Schanz DO, 07/30/2021   ? ? Patient ID: Diane Hodges, female    DOB: 22-Nov-1955, 66 y.o.   MRN: [id number] ? ?Chief Complaint  ?Patient presents with  ? Annual Exam  ?  Pt states fasting   ? ? ?HPI ?Patient is in today for a comprehensive physical exam. ? ?She is requesting a refill of 6.25 MG of Coreg and 10 MG of Crestor.  ? ?She denies having any fever, ear pain, new muscle pain, joint pain, new moles, congestion, sinus pain, sore throat, palpations, wheezing, n/v/d, constipation, blood in stool, dysuria, frequency, hematuria at this time. ? ?Colonoscopy was last completed on 12/16/2005. 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 ?Mammogram was last completed on 04/09/2014 ?She has not received her COVID 19 vaccines. She is not interested in receiving an pneumonia or shingles vaccine.  ?She is UTD on dental exams.  ?She is UTD on vision exams.  ? ? ?Past Medical History:  ?Diagnosis Date  ? Atypical nevus 04/12/2014  ? mid back -mild  ? Hypertension   ? Thyroid disease   ? Hypothyridism  ? ? ?Past Surgical History:  ?Procedure Laterality Date  ? CATARACT EXTRACTION Right 2015  ? OOPHORECTOMY  1980  ? ? ?Family History  ?Problem Relation Age of Onset  ? Hypertension Mother   ? Hypertension Father   ? Thyroid disease Other   ? ? ?Social History  ? ?Socioeconomic History  ? Marital status: Married  ?  Spouse name: Not on file  ? Number of children: Not on file  ? Years of education: Not on file  ? Highest education level: Not on file  ?Occupational History  ? Occupation: credit reporting  ?  Employer: [employer]  ?Tobacco Use  ? Smoking status: Never  ? Smokeless tobacco: Never  ?Substance and Sexual Activity  ? Alcohol use: No  ? Drug use: No  ? Sexual activity: Yes  ?  Partners: Male  ?Other Topics Concern  ? Not on file  ?Social History Narrative   ? Exercise-- none  ? ?Social Determinants of Health  ? ?Financial Resource Strain: Low Risk   ? Difficulty of Paying Living Expenses: Not hard at all  ?Food Insecurity: No Food Insecurity  ? Worried About Charity fundraiser in the Last Year: Never true  ? Ran Out of Food in the Last Year: Never true  ?Transportation Needs: No Transportation Needs  ? Lack of Transportation (Medical): No  ? Lack of Transportation (Non-Medical): No  ?Physical Activity: Inactive  ? Days of Exercise per Week: 0 days  ? Minutes of Exercise per Session: 0 min  ?Stress: No Stress Concern Present  ? Feeling of Stress : Not at all  ?Social Connections: Socially Integrated  ? Frequency of Communication with Friends and Family: More than three times a week  ? Frequency of Social Gatherings with Friends and Family: More than three times a week  ? Attends Religious Services: More than 4 times per year  ? Active Member of Clubs or Organizations: Yes  ? Attends Archivist Meetings: More than 4 times per year  ? Marital Status: Married  ?Intimate Partner Violence: Not At Risk  ? Fear of Current or Ex-Partner: No  ? Emotionally Abused: No  ? Physically Abused: No  ? Sexually Abused: No  ? ? ?  Outpatient Medications Prior to Visit  ?Medication Sig Dispense Refill  ? blood glucose meter kit and supplies KIT Dispense based on patient and insurance preference. Use up to four times daily as directed. (FOR ICD-9 250.00, 250.01). 1 each 0  ? fenofibrate 54 MG tablet TAKE 1 TABLET BY MOUTH EVERY DAY 90 tablet 0  ? glucose blood test strip Onetouch verio test strips. Check blood sugar once a day 100 each 0  ? Omega-3 Fatty Acids (FISH OIL) 1200 MG CAPS Take 1 capsule by mouth daily.    ? ONETOUCH DELICA LANCETS FINE MISC 1 Device by Does not apply route daily. 100 each 0  ? triamcinolone cream (KENALOG) 0.1 % APPLY TO AFFECTED AREA EVERY DAY  4  ? vitamin E 400 UNIT capsule Take 400 Units by mouth daily.    ? carvedilol (COREG) 6.25 MG tablet Take  1 tablet (6.25 mg total) by mouth 2 (two) times daily with a meal. Pt needs office visit for more refills 180 tablet 0  ? levothyroxine (SYNTHROID) 112 MCG tablet TAKE 1 TABLET BY MOUTH EVERY DAY BEFORE BREAKFAST *NEED APPOINTMENT FOR REFILLS* 30 tablet 0  ? rosuvastatin (CRESTOR) 10 MG tablet TAKE 1 TABLET BY MOUTH EVERY DAY *NEED APPOINTMENT FOR REFILLS* 30 tablet 0  ? metFORMIN (GLUCOPHAGE) 500 MG tablet Take 2 tablets (1,000 mg total) by mouth 2 (two) times daily with a meal. (Patient not taking: Reported on 02/08/2021) 60 tablet 2  ? ?No facility-administered medications prior to visit.  ? ? ?Allergies  ?Allergen Reactions  ? Azithromycin   ?  REACTION: rash  ? ? ?Review of Systems  ?Constitutional:  Negative for fever.  ?HENT:  Negative for congestion, ear pain, sinus pain and sore throat.   ?Respiratory:  Negative for wheezing.   ?Cardiovascular:  Negative for palpitations.  ?Gastrointestinal:  Negative for blood in stool, constipation, diarrhea and nausea.  ?Genitourinary:  Negative for dysuria, frequency and hematuria.  ?Musculoskeletal:  Negative for joint pain and myalgias.  ?Skin:   ?     (-) [place]  ? ?   ?Objective:  ?  ?Physical Exam ?Constitutional:   ?   Appearance: Normal appearance.  ?HENT:  ?   Head: Normocephalic and atraumatic.  ?   Right Ear: Tympanic membrane, ear canal and external ear normal.  ?   Left Ear: Tympanic membrane, ear canal and external ear normal.  ?Eyes:  ?   Extraocular Movements: Extraocular movements intact.  ?   Pupils: Pupils are equal, round, and reactive to light.  ?Cardiovascular:  ?   Rate and Rhythm: Normal rate and regular rhythm.  ?   Heart sounds: Normal heart sounds. No murmur heard. ?  No gallop.  ?Pulmonary:  ?   Effort: Pulmonary effort is normal. No respiratory distress.  ?   Breath sounds: Normal breath sounds. No wheezing or rales.  ?Abdominal:  ?   General: Bowel sounds are normal. There is no distension.  ?   Palpations: Abdomen is soft.  ?    Tenderness: There is no abdominal tenderness. There is no guarding.  ?Skin: ?   General: Skin is warm and dry.  ?Neurological:  ?   Mental Status: She is alert and oriented to person, place, and time.  ?Psychiatric:     ?   Judgment: Judgment normal.  ? ? ?BP 118/70 (BP Location: Left Arm, Patient Position: Sitting, Cuff Size: Large)   Pulse 77   Temp 97.9 ?F (36.6 ?C) (Oral)  Resp 18   Ht 5' 5"  (1.651 m)   Wt 213 lb (96.6 kg)   SpO2 97%   BMI 35.45 kg/m?  ?Wt Readings from Last 3 Encounters:  ?07/30/21 213 lb (96.6 kg)  ?02/08/21 211 lb (95.7 kg)  ?06/26/20 211 lb 3.2 oz (95.8 kg)  ? ? ?Diabetic Foot Exam - Simple   ?No data filed ?  ? ?Lab Results  ?Component Value Date  ? WBC 6.2 06/26/2020  ? HGB 13.5 06/26/2020  ? HCT 40.8 06/26/2020  ? PLT 209.0 06/26/2020  ? GLUCOSE 125 (H) 02/07/2021  ? CHOL 113 02/07/2021  ? TRIG 208.0 (H) 02/07/2021  ? HDL 41.60 02/07/2021  ? LDLDIRECT 47.0 02/07/2021  ? [place] 29 10/18/2020  ? ALT 15 02/07/2021  ? AST 17 02/07/2021  ? NA 139 02/07/2021  ? K 4.5 02/07/2021  ? CL 103 02/07/2021  ? CREATININE 0.69 02/07/2021  ? BUN 13 02/07/2021  ? CO2 30 02/07/2021  ? TSH 2.08 06/26/2020  ? HGBA1C 7.2 (H) 02/07/2021  ? MICROALBUR 2.3 (H) 06/26/2020  ? ? ?Lab Results  ?Component Value Date  ? TSH 2.08 06/26/2020  ? ?Lab Results  ?Component Value Date  ? WBC 6.2 06/26/2020  ? HGB 13.5 06/26/2020  ? HCT 40.8 06/26/2020  ? MCV 94.0 06/26/2020  ? PLT 209.0 06/26/2020  ? ?Lab Results  ?Component Value Date  ? NA 139 02/07/2021  ? K 4.5 02/07/2021  ? CO2 30 02/07/2021  ? GLUCOSE 125 (H) 02/07/2021  ? BUN 13 02/07/2021  ? CREATININE 0.69 02/07/2021  ? BILITOT 0.5 02/07/2021  ? ALKPHOS 67 02/07/2021  ? AST 17 02/07/2021  ? ALT 15 02/07/2021  ? PROT 7.2 02/07/2021  ? ALBUMIN 4.7 02/07/2021  ? CALCIUM 9.4 02/07/2021  ? GFR 90.12 02/07/2021  ? ?Lab Results  ?Component Value Date  ? CHOL 113 02/07/2021  ? ?Lab Results  ?Component Value Date  ? HDL 41.60 02/07/2021  ? ?Lab Results  ?Component  Value Date  ? [place] 29 10/18/2020  ? ?Lab Results  ?Component Value Date  ? TRIG 208.0 (H) 02/07/2021  ? ?Lab Results  ?Component Value Date  ? CHOLHDL 3 02/07/2021  ? ?Lab Results  ?Component Value Date  ? HGBA1C 7.2

## 2021-07-30 NOTE — Assessment & Plan Note (Signed)
Tolerating statin, encouraged heart healthy diet, avoid trans fats, minimize simple carbs and saturated fats. Increase exercise as tolerated 

## 2021-07-30 NOTE — Assessment & Plan Note (Signed)
Well controlled, no changes to meds. Encouraged heart healthy diet such as the DASH diet and exercise as tolerated.  °

## 2021-08-06 ENCOUNTER — Other Ambulatory Visit: Payer: Self-pay | Admitting: Family Medicine

## 2021-08-06 ENCOUNTER — Telehealth: Payer: Self-pay

## 2021-08-06 DIAGNOSIS — E1165 Type 2 diabetes mellitus with hyperglycemia: Secondary | ICD-10-CM

## 2021-08-06 DIAGNOSIS — E1169 Type 2 diabetes mellitus with other specified complication: Secondary | ICD-10-CM

## 2021-08-06 DIAGNOSIS — E785 Hyperlipidemia, unspecified: Secondary | ICD-10-CM

## 2021-08-06 NOTE — Telephone Encounter (Signed)
Opened in error

## 2021-08-07 ENCOUNTER — Other Ambulatory Visit: Payer: Self-pay

## 2021-08-07 DIAGNOSIS — I10 Essential (primary) hypertension: Secondary | ICD-10-CM

## 2021-08-07 DIAGNOSIS — E1169 Type 2 diabetes mellitus with other specified complication: Secondary | ICD-10-CM

## 2021-08-07 DIAGNOSIS — E1165 Type 2 diabetes mellitus with hyperglycemia: Secondary | ICD-10-CM

## 2021-08-07 MED ORDER — METFORMIN HCL 500 MG PO TABS
500.0000 mg | ORAL_TABLET | Freq: Two times a day (BID) | ORAL | 2 refills | Status: DC
Start: 2021-08-07 — End: 2021-11-05

## 2021-08-28 ENCOUNTER — Other Ambulatory Visit: Payer: Self-pay | Admitting: Family Medicine

## 2021-08-28 DIAGNOSIS — E039 Hypothyroidism, unspecified: Secondary | ICD-10-CM

## 2021-09-25 ENCOUNTER — Other Ambulatory Visit: Payer: Self-pay | Admitting: Family Medicine

## 2021-09-25 DIAGNOSIS — E039 Hypothyroidism, unspecified: Secondary | ICD-10-CM

## 2021-10-21 ENCOUNTER — Other Ambulatory Visit: Payer: Self-pay | Admitting: Family Medicine

## 2021-10-21 DIAGNOSIS — E039 Hypothyroidism, unspecified: Secondary | ICD-10-CM

## 2021-11-03 ENCOUNTER — Other Ambulatory Visit: Payer: Self-pay | Admitting: Family Medicine

## 2021-11-08 ENCOUNTER — Other Ambulatory Visit (INDEPENDENT_AMBULATORY_CARE_PROVIDER_SITE_OTHER): Payer: Medicare Other

## 2021-11-08 DIAGNOSIS — E1169 Type 2 diabetes mellitus with other specified complication: Secondary | ICD-10-CM

## 2021-11-08 DIAGNOSIS — E785 Hyperlipidemia, unspecified: Secondary | ICD-10-CM

## 2021-11-08 DIAGNOSIS — E1165 Type 2 diabetes mellitus with hyperglycemia: Secondary | ICD-10-CM

## 2021-11-08 LAB — LIPID PANEL
Cholesterol: 104 mg/dL (ref 0–200)
HDL: 42.5 mg/dL
LDL Cholesterol: 35 mg/dL (ref 0–99)
NonHDL: 61.32
Total CHOL/HDL Ratio: 2
Triglycerides: 133 mg/dL (ref 0.0–149.0)
VLDL: 26.6 mg/dL (ref 0.0–40.0)

## 2021-11-08 LAB — COMPREHENSIVE METABOLIC PANEL
ALT: 14 U/L (ref 0–35)
AST: 16 U/L (ref 0–37)
Albumin: 4.7 g/dL (ref 3.5–5.2)
Alkaline Phosphatase: 54 U/L (ref 39–117)
BUN: 15 mg/dL (ref 6–23)
CO2: 28 mEq/L (ref 19–32)
Calcium: 9.1 mg/dL (ref 8.4–10.5)
Chloride: 102 mEq/L (ref 96–112)
Creatinine, Ser: 0.8 mg/dL (ref 0.40–1.20)
GFR: 76.11 mL/min (ref >60.00)
Glucose, Bld: 131 mg/dL — ABNORMAL HIGH (ref 70–99)
Potassium: 4 mEq/L (ref 3.5–5.1)
Sodium: 139 mEq/L (ref 135–145)
Total Bilirubin: 0.5 mg/dL (ref 0.2–1.2)
Total Protein: 6.9 g/dL (ref 6.0–8.3)

## 2021-11-08 LAB — HEMOGLOBIN A1C: Hgb A1c MFr Bld: 7 % — ABNORMAL HIGH (ref 4.6–6.5)

## 2021-11-12 ENCOUNTER — Other Ambulatory Visit: Payer: Self-pay | Admitting: Family Medicine

## 2021-11-12 DIAGNOSIS — E785 Hyperlipidemia, unspecified: Secondary | ICD-10-CM

## 2021-11-13 ENCOUNTER — Other Ambulatory Visit: Payer: Self-pay | Admitting: *Deleted

## 2021-11-13 MED ORDER — SEMAGLUTIDE(0.25 OR 0.5MG/DOS) 2 MG/3ML ~~LOC~~ SOPN: 0.2500 mg | PEN_INJECTOR | SUBCUTANEOUS | 1 refills | Status: DC

## 2022-01-01 ENCOUNTER — Other Ambulatory Visit: Payer: Self-pay | Admitting: Family Medicine

## 2022-01-01 DIAGNOSIS — E039 Hypothyroidism, unspecified: Secondary | ICD-10-CM

## 2022-01-27 ENCOUNTER — Other Ambulatory Visit: Payer: Self-pay | Admitting: Family Medicine

## 2022-01-27 DIAGNOSIS — E785 Hyperlipidemia, unspecified: Secondary | ICD-10-CM

## 2022-01-28 ENCOUNTER — Encounter: Payer: Self-pay | Admitting: *Deleted

## 2022-01-29 ENCOUNTER — Other Ambulatory Visit: Payer: Self-pay | Admitting: Family Medicine

## 2022-01-29 DIAGNOSIS — E039 Hypothyroidism, unspecified: Secondary | ICD-10-CM

## 2022-02-08 ENCOUNTER — Other Ambulatory Visit: Payer: Self-pay | Admitting: Family Medicine

## 2022-02-08 DIAGNOSIS — E785 Hyperlipidemia, unspecified: Secondary | ICD-10-CM

## 2022-02-11 ENCOUNTER — Ambulatory Visit: Payer: Medicare Other

## 2022-02-13 ENCOUNTER — Ambulatory Visit (INDEPENDENT_AMBULATORY_CARE_PROVIDER_SITE_OTHER): Payer: Medicare Other | Admitting: *Deleted

## 2022-02-13 DIAGNOSIS — Z0000 Encounter for general adult medical examination without abnormal findings: Secondary | ICD-10-CM | POA: Diagnosis not present

## 2022-02-13 NOTE — Patient Instructions (Signed)
Diane Hodges , Thank you for taking time to come for your Medicare Wellness Visit. I appreciate your ongoing commitment to your health goals. Please review the following plan we discussed and let me know if I can assist you in the future.   These are the goals we discussed:  Goals      Patient Stated     Control diabetes with diet        This is a list of the screening recommended for you and due dates:  Health Maintenance  Topic Date Due   COVID-19 Vaccine (1) Never done   Cologuard (Stool DNA test)  Never done   Zoster (Shingles) Vaccine (1 of 2) Never done   Mammogram  04/10/2015   Eye exam for diabetics  04/17/2017   DEXA scan (bone density measurement)  Never done   Complete foot exam   06/26/2021   Flu Shot  10/30/2021   Tetanus Vaccine  11/13/2021   Pneumonia Vaccine (1 - PCV) 07/31/2022*   Hemoglobin A1C  05/11/2022   Yearly kidney health urinalysis for diabetes  07/31/2022   Yearly kidney function blood test for diabetes  11/09/2022   Medicare Annual Wellness Visit  02/14/2023   Hepatitis C Screening: USPSTF Recommendation to screen - Ages 18-79 yo.  Completed   HPV Vaccine  Aged Out   Colon Cancer Screening  Discontinued  *Topic was postponed. The date shown is not the original due date.     Next appointment: Follow up in one year for your annual wellness visit.   Preventive Care 33 Years and Older, Female Preventive care refers to lifestyle choices and visits with your health care provider that can promote health and wellness. What does preventive care include? A yearly physical exam. This is also called an annual well check. Dental exams once or twice a year. Routine eye exams. Ask your health care provider how often you should have your eyes checked. Personal lifestyle choices, including: Daily care of your teeth and gums. Regular physical activity. Eating a healthy diet. Avoiding tobacco and drug use. Limiting alcohol use. Practicing safe sex. Taking  low-dose aspirin every day. Taking vitamin and mineral supplements as recommended by your health care provider. What happens during an annual well check? The services and screenings done by your health care provider during your annual well check will depend on your age, overall health, lifestyle risk factors, and family history of disease. Counseling  Your health care provider may ask you questions about your: Alcohol use. Tobacco use. Drug use. Emotional well-being. Home and relationship well-being. Sexual activity. Eating habits. History of falls. Memory and ability to understand (cognition). Work and work Statistician. Reproductive health. Screening  You may have the following tests or measurements: Height, weight, and BMI. Blood pressure. Lipid and cholesterol levels. These may be checked every 5 years, or more frequently if you are over 52 years old. Skin check. Lung cancer screening. You may have this screening every year starting at age 22 if you have a 30-pack-year history of smoking and currently smoke or have quit within the past 15 years. Fecal occult blood test (FOBT) of the stool. You may have this test every year starting at age 30. Flexible sigmoidoscopy or colonoscopy. You may have a sigmoidoscopy every 5 years or a colonoscopy every 10 years starting at age 19. Hepatitis C blood test. Hepatitis B blood test. Sexually transmitted disease (STD) testing. Diabetes screening. This is done by checking your blood sugar (glucose) after you have  not eaten for a while (fasting). You may have this done every 1-3 years. Bone density scan. This is done to screen for osteoporosis. You may have this done starting at age 75. Mammogram. This may be done every 1-2 years. Talk to your health care provider about how often you should have regular mammograms. Talk with your health care provider about your test results, treatment options, and if necessary, the need for more tests. Vaccines   Your health care provider may recommend certain vaccines, such as: Influenza vaccine. This is recommended every year. Tetanus, diphtheria, and acellular pertussis (Tdap, Td) vaccine. You may need a Td booster every 10 years. Zoster vaccine. You may need this after age 34. Pneumococcal 13-valent conjugate (PCV13) vaccine. One dose is recommended after age 32. Pneumococcal polysaccharide (PPSV23) vaccine. One dose is recommended after age 57. Talk to your health care provider about which screenings and vaccines you need and how often you need them. This information is not intended to replace advice given to you by your health care provider. Make sure you discuss any questions you have with your health care provider. Document Released: 04/14/2015 Document Revised: 12/06/2015 Document Reviewed: 01/17/2015 Elsevier Interactive Patient Education  2017 [place] Prevention in the Home Falls can cause injuries. They can happen to people of all ages. There are many things you can do to make your home safe and to help prevent falls. What can I do on the outside of my home? Regularly fix the edges of walkways and driveways and fix any cracks. Remove anything that might make you trip as you walk through a door, such as a raised step or threshold. Trim any bushes or trees on the path to your home. Use bright outdoor lighting. Clear any walking paths of anything that might make someone trip, such as rocks or tools. Regularly check to see if handrails are loose or broken. Make sure that both sides of any steps have handrails. Any raised decks and porches should have guardrails on the edges. Have any leaves, snow, or ice cleared regularly. Use sand or salt on walking paths during winter. Clean up any spills in your garage right away. This includes oil or grease spills. What can I do in the bathroom? Use night lights. Install grab bars by the toilet and in the tub and shower. Do not use towel  bars as grab bars. Use non-skid mats or decals in the tub or shower. If you need to sit down in the shower, use a plastic, non-slip stool. Keep the floor dry. Clean up any water that spills on the floor as soon as it happens. Remove soap buildup in the tub or shower regularly. Attach bath mats securely with double-sided non-slip rug tape. Do not have throw rugs and other things on the floor that can make you trip. What can I do in the bedroom? Use night lights. Make sure that you have a light by your bed that is easy to reach. Do not use any sheets or blankets that are too big for your bed. They should not hang down onto the floor. Have a firm chair that has side arms. You can use this for support while you get dressed. Do not have throw rugs and other things on the floor that can make you trip. What can I do in the kitchen? Clean up any spills right away. Avoid walking on wet floors. Keep items that you use a lot in easy-to-reach places. If you need to  reach something above you, use a strong step stool that has a grab bar. Keep electrical cords out of the way. Do not use floor polish or wax that makes floors slippery. If you must use wax, use non-skid floor wax. Do not have throw rugs and other things on the floor that can make you trip. What can I do with my stairs? Do not leave any items on the stairs. Make sure that there are handrails on both sides of the stairs and use them. Fix handrails that are broken or loose. Make sure that handrails are as long as the stairways. Check any carpeting to make sure that it is firmly attached to the stairs. Fix any carpet that is loose or worn. Avoid having throw rugs at the top or bottom of the stairs. If you do have throw rugs, attach them to the floor with carpet tape. Make sure that you have a light switch at the top of the stairs and the bottom of the stairs. If you do not have them, ask someone to add them for you. What else can I do to help  prevent falls? Wear shoes that: Do not have high heels. Have rubber bottoms. Are comfortable and fit you well. Are closed at the toe. Do not wear sandals. If you use a stepladder: Make sure that it is fully opened. Do not climb a closed stepladder. Make sure that both sides of the stepladder are locked into place. Ask someone to hold it for you, if possible. Clearly mark and make sure that you can see: Any grab bars or handrails. First and last steps. Where the edge of each step is. Use tools that help you move around (mobility aids) if they are needed. These include: Canes. Walkers. Scooters. Crutches. Turn on the lights when you go into a dark area. Replace any light bulbs as soon as they burn out. Set up your furniture so you have a clear path. Avoid moving your furniture around. If any of your floors are uneven, fix them. If there are any pets around you, be aware of where they are. Review your medicines with your doctor. Some medicines can make you feel dizzy. This can increase your chance of falling. Ask your doctor what other things that you can do to help prevent falls. This information is not intended to replace advice given to you by your health care provider. Make sure you discuss any questions you have with your health care provider. Document Released: 01/12/2009 Document Revised: 08/24/2015 Document Reviewed: 04/22/2014 Elsevier Interactive Patient Education  2017 [employer].

## 2022-02-13 NOTE — Progress Notes (Signed)
Subjective:   Diane Hodges is a 65 y.o. female who presents for Medicare Annual (Subsequent) preventive examination.  I connected with  Diane Hodges on 02/13/22 by a audio enabled telemedicine application and verified that I am speaking with the correct person using two identifiers.  Patient Location: Home  Provider Location: Office/Clinic  I discussed the limitations of evaluation and management by telemedicine. The patient expressed understanding and agreed to proceed.   Review of Systems    Defer to PCP Cardiac Risk Factors include: advanced age (>59mn, >>63women);hypertension;diabetes mellitus;dyslipidemia     Objective:    There were no vitals filed for this visit. There is no height or weight on file to calculate BMI.     02/13/2022   11:02 AM 02/08/2021   11:06 AM  Advanced Directives  Does Patient Have a Medical Advance Directive? No No  Would patient like information on creating a medical advance directive? No - Patient declined Yes (MAU/Ambulatory/Procedural Areas - Information given)    Current Medications (verified) Outpatient Encounter Medications as of 02/13/2022  Medication Sig   blood glucose meter kit and supplies KIT Dispense based on patient and insurance preference. Use up to four times daily as directed. (FOR ICD-9 250.00, 250.01).   carvedilol (COREG) 6.25 MG tablet Take 1 tablet (6.25 mg total) by mouth 2 (two) times daily with a meal.   fenofibrate 54 MG tablet TAKE 1 TABLET BY MOUTH EVERY DAY   glucose blood test strip Onetouch verio test strips. Check blood sugar once a day   levothyroxine (SYNTHROID) 112 MCG tablet Take 1 tablet (112 mcg total) by mouth daily before breakfast.   metFORMIN (GLUCOPHAGE) 500 MG tablet TAKE 1 TABLET BY MOUTH 2 TIMES DAILY WITH A MEAL.   Omega-3 Fatty Acids (FISH OIL) 1200 MG CAPS Take 1 capsule by mouth daily.   ONETOUCH DELICA LANCETS FINE MISC 1 Device by Does not apply route daily.   rosuvastatin  (CRESTOR) 10 MG tablet TAKE 1 TABLET BY MOUTH EVERY DAY   Semaglutide,0.25 or 0.5MG/DOS, 2 MG/3ML SOPN Inject 0.25 mg into the skin once a week. Increase to 0.555min one month.   triamcinolone cream (KENALOG) 0.1 % APPLY TO AFFECTED AREA EVERY DAY   vitamin E 400 UNIT capsule Take 400 Units by mouth daily.   No facility-administered encounter medications on file as of 02/13/2022.    Allergies (verified) Azithromycin   History: Past Medical History:  Diagnosis Date   Atypical nevus 04/12/2014   mid back -mild   Hypertension    Thyroid disease    Hypothyridism   Past Surgical History:  Procedure Laterality Date   CATARACT EXTRACTION Right 2015   OOPHORECTOMY  1980   Family History  Problem Relation Age of Onset   Hypertension Mother    Hypertension Father    Thyroid disease Other    Social History   Socioeconomic History   Marital status: Married    Spouse name: Not on file   Number of children: Not on file   Years of education: Not on file   Highest education level: Not on file  Occupational History   Occupation: credit reporting    Employer: [employer]  Tobacco Use   Smoking status: Never   Smokeless tobacco: Never  Substance and Sexual Activity   Alcohol use: No   Drug use: No   Sexual activity: Yes    Partners: Male  Other Topics Concern   Not on file  Social  History Narrative   Exercise-- none   Social Determinants of Health   Financial Resource Strain: Low Risk  (02/08/2021)   Overall Financial Resource Strain (CARDIA)    Difficulty of Paying Living Expenses: Not hard at all  Food Insecurity: No Food Insecurity (02/13/2022)   Hunger Vital Sign    Worried About Running Out of Food in the Last Year: Never true    Ran Out of Food in the Last Year: Never true  Transportation Needs: No Transportation Needs (02/13/2022)   PRAPARE - Hydrologist (Medical): No    Lack of Transportation (Non-Medical): No  Physical  Activity: Inactive (02/08/2021)   Exercise Vital Sign    Days of Exercise per Week: 0 days    Minutes of Exercise per Session: 0 min  Stress: No Stress Concern Present (02/08/2021)   [place]    Feeling of Stress : Not at all  Social Connections: [place] (02/08/2021)   Social Connection and Isolation Panel [NHANES]    Frequency of Communication with Friends and Family: More than three times a week    Frequency of Social Gatherings with Friends and Family: More than three times a week    Attends Religious Services: More than 4 times per year    Active Member of [employer] or Organizations: Yes    Attends Music therapist: More than 4 times per year    Marital Status: Married    Tobacco Counseling Counseling given: Not Answered   Clinical Intake:  Pre-visit preparation completed: Yes  Pain : No/denies pain  How often do you need to have someone help you when you read instructions, pamphlets, or other written materials from your doctor or pharmacy?: 1 - Never  Diabetic? Nutrition Risk Assessment:  Has the patient had any N/V/D within the last 2 months?  No  Does the patient have any non-healing wounds?  No  Has the patient had any unintentional weight loss or weight gain?  No   Diabetes:  Is the patient diabetic?  Yes  If diabetic, was a CBG obtained today?  No  Did the patient bring in their glucometer from home?   Audio visit How often do you monitor your CBG's? never.   Financial Strains and Diabetes Management:  Are you having any financial strains with the device, your supplies or your medication? No .  Does the patient want to be seen by Chronic Care Management for management of their diabetes?  No  Would the patient like to be referred to a Nutritionist or for Diabetic Management?  No   Diabetic Exams:  Diabetic Eye Exam: Overdue for diabetic eye exam. Pt has been advised  about the importance in completing this exam. Patient advised to call and schedule an eye exam. Diabetic Foot Exam: Overdue, Pt has been advised about the importance in completing this exam. Pt is scheduled for diabetic foot exam on N/a.  Interpreter Needed?: No  Information entered by :: Beatris Ship, [place]   Activities of Daily Living    02/13/2022   11:05 AM  In your present state of health, do you have any difficulty performing the following activities:  Hearing? 0  Vision? 0  Difficulty concentrating or making decisions? 0  Walking or climbing stairs? 0  Dressing or bathing? 0  Doing errands, shopping? 0  Preparing Food and eating ? N  Using the Toilet? N  In the past six months, have  you accidently leaked urine? Y  Do you have problems with loss of bowel control? N  Managing your Medications? N  Managing your Finances? N  Housekeeping or managing your Housekeeping? N    Patient Care Team: Carollee Herter, Alferd Apa, DO as PCP - General Maisie Fus, MD (Inactive) as Consulting Physician (Obstetrics and Gynecology) Lavonna Monarch, MD (Inactive) as Consulting Physician (Dermatology) Latanya Maudlin, MD as Consulting Physician (Orthopedic Surgery)  Indicate any recent Medical Services you may have received from other than [facility] providers in the past year (date may be approximate).     Assessment:   This is a routine wellness examination for [facility].  Hearing/Vision screen No results found.  Dietary issues and exercise activities discussed: Current Exercise Habits: Home exercise routine, Type of exercise: walking, Time (Minutes): 60, Frequency (Times/Week): 7 (weather permitting), Weekly Exercise (Minutes/Week): 420, Intensity: Mild, Exercise limited by: None identified   Goals Addressed   None    Depression Screen    02/13/2022   11:03 AM 07/30/2021    9:47 AM 02/08/2021   11:19 AM 10/21/2019   10:29 AM 02/23/2018   11:21 AM 09/11/2015   10:41 AM 02/19/2013    9:14 AM   PHQ 2/9 Scores  PHQ - 2 Score 0 0 0 0 0 0 1  PHQ- 9 Score     2      Fall Risk    02/13/2022   11:03 AM 07/30/2021    9:47 AM 02/08/2021   11:18 AM  Fall Risk   Falls in the past year? 0 0 0  Number falls in past yr: 0 0 0  Injury with Fall? 0 0 0  Risk for fall due to : No Fall Risks    Follow up Falls evaluation completed Falls evaluation completed Falls prevention discussed    [place]:  Any stairs in or around the home? Yes  If so, are there any without handrails? No  Home free of loose throw rugs in walkways, pet beds, electrical cords, etc? Yes  Adequate lighting in your home to reduce risk of falls? Yes   ASSISTIVE DEVICES UTILIZED TO PREVENT FALLS:  Life alert? No  Use of a cane, walker or w/c? No  Grab bars in the bathroom? Yes  Shower chair or bench in shower? No  Elevated toilet seat or a handicapped toilet? No   TIMED UP AND GO:  Was the test performed?  No, audio visit .    Cognitive Function:        02/13/2022   11:08 AM  6CIT Screen  What Year? 0 points  What month? 0 points  What time? 0 points  Count back from 20 0 points  Months in reverse 0 points  Repeat phrase 0 points  Total Score 0 points    Immunizations Immunization History  Administered Date(s) Administered   Influenza,inj,Quad PF,6+ Mos 11/22/2016   Td 08/12/2001   Tdap 11/14/2011    TDAP status: Due, Education has been provided regarding the importance of this vaccine. Advised may receive this vaccine at local pharmacy or Health Dept. Aware to provide a copy of the vaccination record if obtained from local pharmacy or Health Dept. Verbalized acceptance and understanding.  Flu Vaccine status: Due, Education has been provided regarding the importance of this vaccine. Advised may receive this vaccine at local pharmacy or Health Dept. Aware to provide a copy of the vaccination record if obtained from local pharmacy or Health  Dept. Verbalized  acceptance and understanding.  Pneumococcal vaccine status: Due, Education has been provided regarding the importance of this vaccine. Advised may receive this vaccine at local pharmacy or Health Dept. Aware to provide a copy of the vaccination record if obtained from local pharmacy or Health Dept. Verbalized acceptance and understanding.  Covid-19 vaccine status: Information provided on how to obtain vaccines.   Qualifies for Shingles Vaccine? Yes   Zostavax completed No   Shingrix Completed?: No.    Education has been provided regarding the importance of this vaccine. Patient has been advised to call insurance company to determine out of pocket expense if they have not yet received this vaccine. Advised may also receive vaccine at local pharmacy or Health Dept. Verbalized acceptance and understanding.  Screening Tests Health Maintenance  Topic Date Due   COVID-19 Vaccine (1) Never done   Fecal DNA (Cologuard)  Never done   Zoster Vaccines- Shingrix (1 of 2) Never done   MAMMOGRAM  04/10/2015   OPHTHALMOLOGY EXAM  04/17/2017   DEXA SCAN  Never done   FOOT EXAM  06/26/2021   INFLUENZA VACCINE  10/30/2021   TETANUS/TDAP  11/13/2021   Medicare Annual Wellness (AWV)  02/08/2022   Pneumonia Vaccine 80+ Years old (1 - PCV) 07/31/2022 (Originally 03/10/2019)   HEMOGLOBIN A1C  05/11/2022   Diabetic kidney evaluation - Urine ACR  07/31/2022   Diabetic kidney evaluation - GFR measurement  11/09/2022   Hepatitis C Screening  Completed   HPV VACCINES  Aged Out   COLONOSCOPY (Pts 45-23yr Insurance coverage will need to be confirmed)  Discontinued    Health Maintenance  Health Maintenance Due  Topic Date Due   COVID-19 Vaccine (1) Never done   Fecal DNA (Cologuard)  Never done   Zoster Vaccines- Shingrix (1 of 2) Never done   MAMMOGRAM  04/10/2015   OPHTHALMOLOGY EXAM  04/17/2017   DEXA SCAN  Never done   FOOT EXAM  06/26/2021   INFLUENZA VACCINE  10/30/2021   TETANUS/TDAP  11/13/2021    Medicare Annual Wellness (AWV)  02/08/2022    Colorectal Cancer screening: pt has Cologuard at home to send in  Mammogram status: pt will have GYN place order  Bone Density status: pt will have GYN place order  Lung Cancer Screening: (Low Dose CT Chest recommended if Age 68-80years, 30 pack-year currently smoking OR have quit w/in 15years.) does not qualify.    Additional Screening:  Hepatitis C Screening: does qualify; Completed 06/29/15  Vision Screening: Recommended annual ophthalmology exams for early detection of glaucoma and other disorders of the eye. Is the patient up to date with their annual eye exam?  No  Who is the provider or what is the name of the office in which the patient attends annual eye exams? [facility] If pt is not established with a provider, would they like to be referred to a provider to establish care? No .   Dental Screening: Recommended annual dental exams for proper oral hygiene  Community Resource Referral / Chronic Care Management: CRR required this visit?  No   CCM required this visit?  No      Plan:     I have personally reviewed and noted the following in the patient's chart:   Medical and social history Use of alcohol, tobacco or illicit drugs  Current medications and supplements including opioid prescriptions. Patient is not currently taking opioid prescriptions. Functional ability and status Nutritional status Physical activity Advanced directives List  of other physicians Hospitalizations, surgeries, and ER visits in previous 12 months Vitals Screenings to include cognitive, depression, and falls Referrals and appointments  In addition, I have reviewed and discussed with patient certain preventive protocols, quality metrics, and best practice recommendations. A written personalized care plan for preventive services as well as general preventive health recommendations were provided to patient.   Due to this being a  telephonic visit, the after visit summary with patients personalized plan was offered to patient via mail or my-chart.  Per request, patient was mailed a copy of AVS.  Beatris Ship, [place]   02/13/2022   Nurse Notes: None

## 2022-02-25 ENCOUNTER — Encounter: Payer: Self-pay | Admitting: Family Medicine

## 2022-02-25 NOTE — Telephone Encounter (Signed)
Error

## 2022-02-28 ENCOUNTER — Encounter: Payer: Self-pay | Admitting: Family Medicine

## 2022-02-28 ENCOUNTER — Other Ambulatory Visit (HOSPITAL_BASED_OUTPATIENT_CLINIC_OR_DEPARTMENT_OTHER): Payer: Self-pay | Admitting: Family Medicine

## 2022-02-28 ENCOUNTER — Ambulatory Visit (INDEPENDENT_AMBULATORY_CARE_PROVIDER_SITE_OTHER): Payer: Medicare Other | Admitting: Family Medicine

## 2022-02-28 VITALS — BP 120/70 | HR 86 | Temp 97.8°F | Resp 18 | Ht 65.0 in | Wt 208.4 lb

## 2022-02-28 DIAGNOSIS — E2839 Other primary ovarian failure: Secondary | ICD-10-CM

## 2022-02-28 DIAGNOSIS — E1165 Type 2 diabetes mellitus with hyperglycemia: Secondary | ICD-10-CM

## 2022-02-28 DIAGNOSIS — R195 Other fecal abnormalities: Secondary | ICD-10-CM | POA: Diagnosis not present

## 2022-02-28 DIAGNOSIS — Z23 Encounter for immunization: Secondary | ICD-10-CM

## 2022-02-28 DIAGNOSIS — R197 Diarrhea, unspecified: Secondary | ICD-10-CM | POA: Diagnosis not present

## 2022-02-28 DIAGNOSIS — E785 Hyperlipidemia, unspecified: Secondary | ICD-10-CM | POA: Diagnosis not present

## 2022-02-28 DIAGNOSIS — Z1231 Encounter for screening mammogram for malignant neoplasm of breast: Secondary | ICD-10-CM

## 2022-02-28 DIAGNOSIS — E039 Hypothyroidism, unspecified: Secondary | ICD-10-CM | POA: Diagnosis not present

## 2022-02-28 DIAGNOSIS — I10 Essential (primary) hypertension: Secondary | ICD-10-CM | POA: Diagnosis not present

## 2022-02-28 DIAGNOSIS — E1169 Type 2 diabetes mellitus with other specified complication: Secondary | ICD-10-CM | POA: Diagnosis not present

## 2022-02-28 LAB — MICROALBUMIN / CREATININE URINE RATIO
Creatinine,U: 113.8 mg/dL
Microalb Creat Ratio: 2.4 mg/g (ref 0.0–30.0)
Microalb, Ur: 2.7 mg/dL — ABNORMAL HIGH (ref 0.0–1.9)

## 2022-02-28 LAB — LIPID PANEL
Cholesterol: 103 mg/dL (ref 0–200)
HDL: 49.9 mg/dL (ref >39.00)
LDL Cholesterol: 32 mg/dL (ref 0–99)
NonHDL: 52.83
Total CHOL/HDL Ratio: 2
Triglycerides: 106 mg/dL (ref 0.0–149.0)
VLDL: 21.2 mg/dL (ref 0.0–40.0)

## 2022-02-28 LAB — CBC WITH DIFFERENTIAL/PLATELET
Basophils Absolute: 0 10*3/uL (ref 0.0–0.1)
Basophils Relative: 0.7 % (ref 0.0–3.0)
Eosinophils Absolute: 0.4 10*3/uL (ref 0.0–0.7)
Eosinophils Relative: 6 % — ABNORMAL HIGH (ref 0.0–5.0)
HCT: 41.1 % (ref 36.0–46.0)
Hemoglobin: 13.8 g/dL (ref 12.0–15.0)
Lymphocytes Relative: 43.7 % (ref 12.0–46.0)
Lymphs Abs: 2.9 10*3/uL (ref 0.7–4.0)
MCHC: 33.5 g/dL (ref 30.0–36.0)
MCV: 93.4 fl (ref 78.0–100.0)
Monocytes Absolute: 0.5 10*3/uL (ref 0.1–1.0)
Monocytes Relative: 7.2 % (ref 3.0–12.0)
Neutro Abs: 2.9 10*3/uL (ref 1.4–7.7)
Neutrophils Relative %: 42.4 % — ABNORMAL LOW (ref 43.0–77.0)
Platelets: 210 10*3/uL (ref 150.0–400.0)
RBC: 4.41 Mil/uL (ref 3.87–5.11)
RDW: 13 % (ref 11.5–15.5)
WBC: 6.7 10*3/uL (ref 4.0–10.5)

## 2022-02-28 LAB — COMPREHENSIVE METABOLIC PANEL
ALT: 16 U/L (ref 0–35)
AST: 16 U/L (ref 0–37)
Albumin: 4.9 g/dL (ref 3.5–5.2)
Alkaline Phosphatase: 57 U/L (ref 39–117)
BUN: 12 mg/dL (ref 6–23)
CO2: 30 mEq/L (ref 19–32)
Calcium: 9.4 mg/dL (ref 8.4–10.5)
Chloride: 103 mEq/L (ref 96–112)
Creatinine, Ser: 0.68 mg/dL (ref 0.40–1.20)
GFR: 89.77 mL/min (ref >60.00)
Glucose, Bld: 116 mg/dL — ABNORMAL HIGH (ref 70–99)
Potassium: 4.2 mEq/L (ref 3.5–5.1)
Sodium: 140 mEq/L (ref 135–145)
Total Bilirubin: 0.4 mg/dL (ref 0.2–1.2)
Total Protein: 7.2 g/dL (ref 6.0–8.3)

## 2022-02-28 LAB — HEMOGLOBIN A1C: Hgb A1c MFr Bld: 6.8 % — ABNORMAL HIGH (ref 4.6–6.5)

## 2022-02-28 LAB — TSH: TSH: 1.6 u[IU]/mL (ref 0.35–5.50)

## 2022-02-28 MED ORDER — CARVEDILOL 6.25 MG PO TABS: 6.2500 mg | ORAL_TABLET | Freq: Two times a day (BID) | ORAL | 1 refills | Status: DC

## 2022-02-28 MED ORDER — ROSUVASTATIN CALCIUM 10 MG PO TABS: 10.0000 mg | ORAL_TABLET | Freq: Every day | ORAL | 0 refills | Status: DC

## 2022-02-28 NOTE — Assessment & Plan Note (Signed)
Well controlled, no changes to meds. Encouraged heart healthy diet such as the DASH diet and exercise as tolerated.  °

## 2022-02-28 NOTE — Progress Notes (Addendum)
Subjective:   By signing my name below, I, Diane Hodges, attest that this documentation has been prepared under the direction and in the presence of Diane Held, DO. 02/28/2022     Patient ID: Diane Hodges, female    DOB: 1955-08-24, 66 y.o.   MRN: [id number]  Chief Complaint  Patient presents with   Hypothyroidism   Hypertension   Diabetes   Hyperlipidemia   Follow-up    Hypertension Pertinent negatives include no chest pain, headaches, malaise/fatigue, palpitations or shortness of breath.  Diabetes Pertinent negatives for hypoglycemia include no dizziness, headaches or nervousness/anxiousness. Pertinent negatives for diabetes include no chest pain and no weakness.  Hyperlipidemia Pertinent negatives include no chest pain, myalgias or shortness of breath.   Patient is in today for a follow up visit.   Her blood pressure is doing well during this visit. She continue staking 6.25 mg carvedilol daily PO and reports no new issues while taking it. She is requesting a refill on it as well.  BP Readings from Last 3 Encounters:  02/28/22 120/70  07/30/21 118/70  06/26/20 104/70   Pulse Readings from Last 3 Encounters:  02/28/22 86  07/30/21 77  06/26/20 86   She continues taking 10 mg rosuvastatin daily PO and reports no new issues while taking it.  Lab Results  Component Value Date   CHOL 103 02/28/2022   HDL 49.90 02/28/2022   LDLCALC 32 02/28/2022   LDLDIRECT 47.0 02/07/2021   TRIG 106.0 02/28/2022   CHOLHDL 2 02/28/2022   She has not started taking ozempic injections yet. She continues taking 500 mg metformin 2x daily PO. Lab Results  Component Value Date   HGBA1C 6.8 (H) 02/28/2022   Her GYN specialist retired. She is interested in scheduling a mammogram. She is also interested in scheduling her bone density exam as well.  She is due for a colonoscopy. She has never sent her cologuard sample in.  She is not interested in a flu vaccine during this  visit. She is due for a tetanus vaccine and is interested in receiving it. She is not interested in receiving the Covid-19 vaccine. She is not interested in receiving the shingles vaccine.  She is due for vision care.    Past Medical History:  Diagnosis Date   Atypical nevus 04/12/2014   mid back -mild   Hypertension    Thyroid disease    Hypothyridism    Past Surgical History:  Procedure Laterality Date   CATARACT EXTRACTION Right 2015   OOPHORECTOMY  1980    Family History  Problem Relation Age of Onset   Hypertension Mother    Hypertension Father    Thyroid disease Other     Social History   Socioeconomic History   Marital status: Married    Spouse name: Not on file   Number of children: Not on file   Years of education: Not on file   Highest education level: Not on file  Occupational History   Occupation: credit reporting    Employer: [employer]  Tobacco Use   Smoking status: Never   Smokeless tobacco: Never  Substance and Sexual Activity   Alcohol use: No   Drug use: No   Sexual activity: Yes    Partners: Male  Other Topics Concern   Not on file  Social History Narrative   Exercise-- none   Social Determinants of Health   Financial Resource Strain: Low Risk  (02/08/2021)   Overall Financial  Resource Strain (CARDIA)    Difficulty of Paying Living Expenses: Not hard at all  Food Insecurity: No Food Insecurity (02/13/2022)   Hunger Vital Sign    Worried About Running Out of Food in the Last Year: Never true    Ran Out of Food in the Last Year: Never true  Transportation Needs: No Transportation Needs (02/13/2022)   PRAPARE - Hydrologist (Medical): No    Lack of Transportation (Non-Medical): No  Physical Activity: Inactive (02/08/2021)   Exercise Vital Sign    Days of Exercise per Week: 0 days    Minutes of Exercise per Session: 0 min  Stress: No Stress Concern Present (02/08/2021)   [place]    Feeling of Stress : Not at all  Social Connections: [place] (02/08/2021)   Social Connection and Isolation Panel [NHANES]    Frequency of Communication with Friends and Family: More than three times a week    Frequency of Social Gatherings with Friends and Family: More than three times a week    Attends Religious Services: More than 4 times per year    Active Member of [employer] or Organizations: Yes    Attends Music therapist: More than 4 times per year    Marital Status: Married  Human resources officer Violence: Not At Risk (02/13/2022)   Humiliation, Afraid, Rape, and Kick questionnaire    Fear of Current or Ex-Partner: No    Emotionally Abused: No    Physically Abused: No    Sexually Abused: No    Outpatient Medications Prior to Visit  Medication Sig Dispense Refill   blood glucose meter kit and supplies KIT Dispense based on patient and insurance preference. Use up to four times daily as directed. (FOR ICD-9 250.00, 250.01). 1 each 0   fenofibrate 54 MG tablet TAKE 1 TABLET BY MOUTH EVERY DAY 90 tablet 1   glucose blood test strip Onetouch verio test strips. Check blood sugar once a day 100 each 0   levothyroxine (SYNTHROID) 112 MCG tablet Take 1 tablet (112 mcg total) by mouth daily before breakfast. 30 tablet 2   metFORMIN (GLUCOPHAGE) 500 MG tablet TAKE 1 TABLET BY MOUTH 2 TIMES DAILY WITH A MEAL. 180 tablet 1   Omega-3 Fatty Acids (FISH OIL) 1200 MG CAPS Take 1 capsule by mouth daily.     ONETOUCH DELICA LANCETS FINE MISC 1 Device by Does not apply route daily. 100 each 0   Semaglutide,0.25 or 0.5MG/DOS, 2 MG/3ML SOPN Inject 0.25 mg into the skin once a week. Increase to 0.60m in one month. 3 mL 1   triamcinolone cream (KENALOG) 0.1 % APPLY TO AFFECTED AREA EVERY DAY  4   vitamin E 400 UNIT capsule Take 400 Units by mouth daily.     carvedilol (COREG) 6.25 MG tablet Take 1 tablet (6.25 mg total) by mouth  2 (two) times daily with a meal. 180 tablet 1   rosuvastatin (CRESTOR) 10 MG tablet TAKE 1 TABLET BY MOUTH EVERY DAY 90 tablet 0   No facility-administered medications prior to visit.    Allergies  Allergen Reactions   Azithromycin     REACTION: rash    Review of Systems  Constitutional:  Negative for chills, fever and malaise/fatigue.  HENT:  Negative for congestion and hearing loss.   Eyes:  Negative for discharge.  Respiratory:  Negative for cough, sputum production and shortness of breath.   Cardiovascular:  Negative for chest pain, palpitations and leg swelling.  Gastrointestinal:  Negative for abdominal pain, blood in stool, constipation, diarrhea, heartburn, nausea and vomiting.  Genitourinary:  Negative for dysuria, frequency, hematuria and urgency.  Musculoskeletal:  Negative for back pain, falls and myalgias.  Skin:  Negative for rash.  Neurological:  Negative for dizziness, sensory change, loss of consciousness, weakness and headaches.  Endo/Heme/Allergies:  Negative for environmental allergies. Does not bruise/bleed easily.  Psychiatric/Behavioral:  Negative for depression and suicidal ideas. The patient is not nervous/anxious and does not have insomnia.        Objective:    Physical Exam Vitals and nursing note reviewed.  Constitutional:      General: She is not in acute distress.    Appearance: Normal appearance. She is not ill-appearing.  HENT:     Head: Normocephalic and atraumatic.     Right Ear: External ear normal.     Left Ear: External ear normal.  Eyes:     Extraocular Movements: Extraocular movements intact.     Pupils: Pupils are equal, round, and reactive to light.  Cardiovascular:     Rate and Rhythm: Normal rate and regular rhythm.     Heart sounds: Normal heart sounds. No murmur heard.    No gallop.  Pulmonary:     Effort: Pulmonary effort is normal. No respiratory distress.     Breath sounds: Normal breath sounds. No wheezing or rales.   Skin:    General: Skin is warm and dry.  Neurological:     Mental Status: She is alert and oriented to person, place, and time.  Psychiatric:        Judgment: Judgment normal.     BP 120/70 (BP Location: Left Arm, Patient Position: Sitting, Cuff Size: Large)   Pulse 86   Temp 97.8 F (36.6 C) (Oral)   Resp 18   Ht 5' 5" (1.651 m)   Wt 208 lb 6.4 oz (94.5 kg)   SpO2 96%   BMI 34.68 kg/m  Wt Readings from Last 3 Encounters:  02/28/22 208 lb 6.4 oz (94.5 kg)  07/30/21 213 lb (96.6 kg)  02/08/21 211 lb (95.7 kg)    Diabetic Foot Exam - Simple   No data filed    Lab Results  Component Value Date   WBC 6.7 02/28/2022   HGB 13.8 02/28/2022   HCT 41.1 02/28/2022   PLT 210.0 02/28/2022   GLUCOSE 116 (H) 02/28/2022   CHOL 103 02/28/2022   TRIG 106.0 02/28/2022   HDL 49.90 02/28/2022   LDLDIRECT 47.0 02/07/2021   LDLCALC 32 02/28/2022   ALT 16 02/28/2022   AST 16 02/28/2022   NA 140 02/28/2022   K 4.2 02/28/2022   CL 103 02/28/2022   CREATININE 0.68 02/28/2022   BUN 12 02/28/2022   CO2 30 02/28/2022   TSH 1.60 02/28/2022   HGBA1C 6.8 (H) 02/28/2022   MICROALBUR 2.7 (H) 02/28/2022    Lab Results  Component Value Date   TSH 1.60 02/28/2022   Lab Results  Component Value Date   WBC 6.7 02/28/2022   HGB 13.8 02/28/2022   HCT 41.1 02/28/2022   MCV 93.4 02/28/2022   PLT 210.0 02/28/2022   Lab Results  Component Value Date   NA 140 02/28/2022   K 4.2 02/28/2022   CO2 30 02/28/2022   GLUCOSE 116 (H) 02/28/2022   BUN 12 02/28/2022   CREATININE 0.68 02/28/2022   BILITOT 0.4 02/28/2022   ALKPHOS 57 02/28/2022  AST 16 02/28/2022   ALT 16 02/28/2022   PROT 7.2 02/28/2022   ALBUMIN 4.9 02/28/2022   CALCIUM 9.4 02/28/2022   GFR 89.77 02/28/2022   Lab Results  Component Value Date   CHOL 103 02/28/2022   Lab Results  Component Value Date   HDL 49.90 02/28/2022   Lab Results  Component Value Date   LDLCALC 32 02/28/2022   Lab Results  Component  Value Date   TRIG 106.0 02/28/2022   Lab Results  Component Value Date   CHOLHDL 2 02/28/2022   Lab Results  Component Value Date   HGBA1C 6.8 (H) 02/28/2022       Assessment & Plan:   Problem List Items Addressed This Visit       Unprioritized   Hypothyroidism    Check labs today Con't synthroid      Relevant Medications   carvedilol (COREG) 6.25 MG tablet   Other Relevant Orders   TSH (Completed)   Essential hypertension    Well controlled, no changes to meds. Encouraged heart healthy diet such as the DASH diet and exercise as tolerated.        Relevant Medications   carvedilol (COREG) 6.25 MG tablet   rosuvastatin (CRESTOR) 10 MG tablet   Other Relevant Orders   CBC with Differential/Platelet (Completed)   Comprehensive metabolic panel (Completed)   Hemoglobin A1c (Completed)   Lipid panel (Completed)   TSH (Completed)   Other Visit Diagnoses     Type 2 diabetes mellitus with hyperglycemia, without long-term current use of insulin (HCC)    -  Primary   Relevant Medications   rosuvastatin (CRESTOR) 10 MG tablet   Other Relevant Orders   CBC with Differential/Platelet (Completed)   Comprehensive metabolic panel (Completed)   Hemoglobin A1c (Completed)   Lipid panel (Completed)   TSH (Completed)   Microalbumin / creatinine urine ratio (Completed)   Hyperlipidemia associated with type 2 diabetes mellitus (HCC)       Relevant Medications   carvedilol (COREG) 6.25 MG tablet   rosuvastatin (CRESTOR) 10 MG tablet   Other Relevant Orders   CBC with Differential/Platelet (Completed)   Comprehensive metabolic panel (Completed)   Hemoglobin A1c (Completed)   Lipid panel (Completed)   Dyslipidemia       Relevant Medications   rosuvastatin (CRESTOR) 10 MG tablet   Estrogen deficiency       Relevant Orders   DG Bone Density   Diarrhea, unspecified type       Loose stools       Relevant Orders   Ambulatory referral to Gastroenterology   Need for tetanus  booster       Relevant Orders   Tdap vaccine greater than or equal to 7yo IM (Completed)        Meds ordered this encounter  Medications   carvedilol (COREG) 6.25 MG tablet    Sig: Take 1 tablet (6.25 mg total) by mouth 2 (two) times daily with a meal.    Dispense:  180 tablet    Refill:  1   rosuvastatin (CRESTOR) 10 MG tablet    Sig: Take 1 tablet (10 mg total) by mouth daily.    Dispense:  90 tablet    Refill:  0    I, Diane Held, DO, personally preformed the services described in this documentation.  All medical record entries made by the scribe were at my direction and in my presence.  I have reviewed the chart and discharge instructions (  if applicable) and agree that the record reflects my personal performance and is accurate and complete. 02/28/2022   I,Diane Hodges,acting as a scribe for Diane Held, DO.,have documented all relevant documentation on the behalf of Diane Held, DO,as directed by  Diane Held, DO while in the presence of Diane Held, DO.   Diane Held, DO

## 2022-02-28 NOTE — Assessment & Plan Note (Signed)
Check labs today Con't synthroid

## 2022-03-02 ENCOUNTER — Other Ambulatory Visit: Payer: Self-pay | Admitting: Family Medicine

## 2022-03-02 DIAGNOSIS — E1169 Type 2 diabetes mellitus with other specified complication: Secondary | ICD-10-CM

## 2022-03-02 DIAGNOSIS — I10 Essential (primary) hypertension: Secondary | ICD-10-CM

## 2022-03-02 DIAGNOSIS — E1165 Type 2 diabetes mellitus with hyperglycemia: Secondary | ICD-10-CM

## 2022-03-02 DIAGNOSIS — E039 Hypothyroidism, unspecified: Secondary | ICD-10-CM

## 2022-03-05 ENCOUNTER — Ambulatory Visit (HOSPITAL_BASED_OUTPATIENT_CLINIC_OR_DEPARTMENT_OTHER)
Admission: RE | Admit: 2022-03-05 | Discharge: 2022-03-05 | Disposition: A | Discharge status: Home / Self Care | Payer: Medicare Other | Source: Ambulatory Visit | Attending: Family Medicine | Admitting: Family Medicine

## 2022-03-05 ENCOUNTER — Encounter (HOSPITAL_BASED_OUTPATIENT_CLINIC_OR_DEPARTMENT_OTHER): Payer: Self-pay

## 2022-03-05 DIAGNOSIS — Z13820 Encounter for screening for osteoporosis: Secondary | ICD-10-CM | POA: Diagnosis not present

## 2022-03-05 DIAGNOSIS — E2839 Other primary ovarian failure: Secondary | ICD-10-CM | POA: Diagnosis present

## 2022-03-05 DIAGNOSIS — Z1231 Encounter for screening mammogram for malignant neoplasm of breast: Secondary | ICD-10-CM

## 2022-03-07 ENCOUNTER — Other Ambulatory Visit: Payer: Self-pay | Admitting: Family Medicine

## 2022-03-07 DIAGNOSIS — R928 Other abnormal and inconclusive findings on diagnostic imaging of breast: Secondary | ICD-10-CM

## 2022-03-20 ENCOUNTER — Other Ambulatory Visit: Payer: Self-pay | Admitting: Family Medicine

## 2022-03-20 ENCOUNTER — Ambulatory Visit
Admission: RE | Admit: 2022-03-20 | Discharge: 2022-03-20 | Disposition: A | Discharge status: Home / Self Care | Payer: Medicare Other | Source: Ambulatory Visit | Attending: Family Medicine | Admitting: Family Medicine

## 2022-03-20 DIAGNOSIS — N6012 Diffuse cystic mastopathy of left breast: Secondary | ICD-10-CM | POA: Diagnosis not present

## 2022-03-20 DIAGNOSIS — R928 Other abnormal and inconclusive findings on diagnostic imaging of breast: Secondary | ICD-10-CM

## 2022-03-20 DIAGNOSIS — N6489 Other specified disorders of breast: Secondary | ICD-10-CM

## 2022-03-26 ENCOUNTER — Other Ambulatory Visit: Payer: Self-pay | Admitting: Family Medicine

## 2022-03-26 DIAGNOSIS — E039 Hypothyroidism, unspecified: Secondary | ICD-10-CM

## 2022-04-08 ENCOUNTER — Ambulatory Visit
Admission: RE | Admit: 2022-04-08 | Discharge: 2022-04-08 | Disposition: A | Discharge status: Home / Self Care | Payer: Medicare Other | Source: Ambulatory Visit | Attending: Family Medicine | Admitting: Family Medicine

## 2022-04-08 DIAGNOSIS — C50812 Malignant neoplasm of overlapping sites of left female breast: Secondary | ICD-10-CM | POA: Diagnosis not present

## 2022-04-08 DIAGNOSIS — N6489 Other specified disorders of breast: Secondary | ICD-10-CM

## 2022-04-08 DIAGNOSIS — R928 Other abnormal and inconclusive findings on diagnostic imaging of breast: Secondary | ICD-10-CM | POA: Diagnosis not present

## 2022-04-08 HISTORY — PX: BREAST BIOPSY: SHX20

## 2022-04-10 ENCOUNTER — Other Ambulatory Visit: Payer: Self-pay | Admitting: Family Medicine

## 2022-04-10 ENCOUNTER — Telehealth: Payer: Self-pay | Admitting: Hematology

## 2022-04-10 DIAGNOSIS — C50912 Malignant neoplasm of unspecified site of left female breast: Secondary | ICD-10-CM

## 2022-04-10 NOTE — Telephone Encounter (Signed)
Spoke to patient to confirm upcoming afternoon [facility] clinic appointment on 1/17, paperwork will be sent via mail.   Gave location and time, also informed patient that the surgeon's office would be calling as well to get information from them similar to the packet that they will be receiving so make sure to do both.  Reminded patient that all providers will be coming to the clinic to see them HERE and if they had any questions to not hesitate to reach back out to myself or their navigators.

## 2022-04-16 ENCOUNTER — Ambulatory Visit
Admission: RE | Admit: 2022-04-16 | Discharge: 2022-04-16 | Disposition: A | Discharge status: Home / Self Care | Payer: Medicare Other | Source: Ambulatory Visit | Attending: Family Medicine | Admitting: Family Medicine

## 2022-04-16 ENCOUNTER — Encounter: Payer: Self-pay | Admitting: *Deleted

## 2022-04-16 DIAGNOSIS — C50912 Malignant neoplasm of unspecified site of left female breast: Secondary | ICD-10-CM

## 2022-04-16 DIAGNOSIS — Z170 Estrogen receptor positive status [ER+]: Secondary | ICD-10-CM | POA: Insufficient documentation

## 2022-04-16 NOTE — Progress Notes (Signed)
Radiation Oncology         [phone] ________________________________  Initial Outpatient Consultation  Name: Diane Hodges: [id number]  Date: 04/17/2022  DOB: 07-18-1955  AC:ZYSAY Chase, Alferd Apa, DO  Jovita Kussmaul, MD   REFERRING PHYSICIAN: Autumn Messing III, MD  DIAGNOSIS: [phone]   ICD-10-CM   1. Malignant neoplasm of overlapping sites of left breast in female, estrogen receptor positive ([place])  C50.812    Z17.0     2. Carcinoma of central portion of left breast in female, estrogen receptor positive ([place])  C50.112    Z17.0        Cancer Staging  Malignant neoplasm of overlapping sites of left breast in female, estrogen receptor positive ([place]) Staging form: Breast, AJCC 8th Edition - Clinical stage from 04/08/2022: Stage Unknown (cTX, cN0, cM0, G1, ER+, PR+, HER2-) - Signed by Truitt Merle, MD on 04/16/2022 Stage prefix: Initial diagnosis Histologic grading system: 3 grade system  Invasive lobular carcinoma of central/overlapping sites of the left breast, ER+ / PR+ / Her2-, Grade 1  CHIEF COMPLAINT: Here to discuss management of left breast cancer  HISTORY OF PRESENT ILLNESS::Diane Hodges is a 67 y.o. female who presented with a left breast abnormality on the following imaging: bilateral screening mammogram on the date of 03/05/22.  No symptoms, if any, were reported at that time. Left breast diagnostic mammogram on 03/20/22 demonstrated an architectural distortion within the upper retroareolar left breast (in the retroareolar to 12 o'clock axis region), an additional possible subtle architectural distortion within the outer central left breast (in the 2-3 o'clock axis region), and left sided nipple retraction. Left breast ultrasound performed that same date did not show any findings to correlate with the distortions seen in the central/retroareolar or outer left breast. Ultrasound however showed several small benign cysts in the 11:00 to 1:00 left breast, the largest  of which measuring approximately 6 mm. [place] otherwise showed no evidence of lymphadenopathy in the left axilla.   Biopsies of cental and outer left breast abnormalities on date of 04/08/22 both showed grade 1 invasive lobular carcinoma (measuring 0.9 cm and 0.5 cm in the greatest linear extent of the sample respectively). One of the distortions also showed LCIS with focal pagetoid spread.  ER status: 100% positive and PR status 100% positive, both with strong staining intensity; Proliferation marker Ki67 at 2%; Her2 status negative; Grade 1.  Of note: the patient also had a bone density study performed on 03/05/22 which showed findings indicative of normal bone mineral density.   She anticipates MRI of breasts to determine surgical options. She is retired and here with very supportive family.  PREVIOUS RADIATION THERAPY: No  PAST MEDICAL HISTORY:  has a past medical history of Atypical nevus (04/12/2014), Hypertension, and Thyroid disease.    PAST SURGICAL HISTORY: Past Surgical History:  Procedure Laterality Date   BREAST BIOPSY Left 04/08/2022   MM LT BREAST BX W LOC DEV EA AD LESION IMG BX SPEC STEREO GUIDE 04/08/2022 [facility]   BREAST BIOPSY Left 04/08/2022   MM LT BREAST BX W LOC DEV 1ST LESION IMAGE BX SPEC STEREO GUIDE 04/08/2022 [facility]   CATARACT EXTRACTION Right 2015   OOPHORECTOMY  1980    FAMILY HISTORY: family history includes Cancer in her mother; Hypertension in her father and mother; Thyroid disease in an other family member.  SOCIAL HISTORY:  reports that she has never smoked. She has never used smokeless tobacco. She reports that she does not  drink alcohol and does not use drugs.  ALLERGIES: Azithromycin  MEDICATIONS:  Current Outpatient Medications  Medication Sig Dispense Refill   blood glucose meter kit and supplies KIT Dispense based on patient and insurance preference. Use up to four times daily as directed. (FOR ICD-9 250.00, 250.01). 1 each 0    carvedilol (COREG) 6.25 MG tablet Take 1 tablet (6.25 mg total) by mouth 2 (two) times daily with a meal. 180 tablet 1   fenofibrate 54 MG tablet TAKE 1 TABLET BY MOUTH EVERY DAY 90 tablet 1   glucose blood test strip Onetouch verio test strips. Check blood sugar once a day 100 each 0   levothyroxine (SYNTHROID) 112 MCG tablet TAKE 1 TABLET BY MOUTH DAILY BEFORE BREAKFAST. 90 tablet 1   metFORMIN (GLUCOPHAGE) 500 MG tablet TAKE 1 TABLET BY MOUTH 2 TIMES DAILY WITH A MEAL. 180 tablet 1   Omega-3 Fatty Acids (FISH OIL) 1200 MG CAPS Take 1 capsule by mouth daily.     ONETOUCH DELICA LANCETS FINE MISC 1 Device by Does not apply route daily. 100 each 0   rosuvastatin (CRESTOR) 10 MG tablet Take 1 tablet (10 mg total) by mouth daily. 90 tablet 0   Semaglutide,0.25 or 0.'5MG'$ /DOS, 2 MG/3ML SOPN Inject 0.25 mg into the skin once a week. Increase to 0.'5mg'$  in one month. 3 mL 1   triamcinolone cream (KENALOG) 0.1 % APPLY TO AFFECTED AREA EVERY DAY  4   vitamin E 400 UNIT capsule Take 400 Units by mouth daily.     No current facility-administered medications for this encounter.    REVIEW OF SYSTEMS: As above in HPI.   PHYSICAL EXAM:  vitals were not taken for this visit.   General: Alert and oriented, in no acute distress HEENT: Head is normocephalic. Extraocular movements are intact.  Heart: Regular in rate and rhythm with no murmurs, rubs, or gallops. Chest: Clear to auscultation bilaterally, with no rhonchi, wheezes, or rales. Abdomen: Soft, nontender, nondistended, with no rigidity or guarding. Extremities: No cyanosis or edema.  Skin: No concerning lesions. Bruising in upper central left breast Musculoskeletal: symmetric strength and muscle tone throughout. Neurologic:   No obvious focalities. Speech is fluent. Coordination is intact. Psychiatric: Judgment and insight are intact. Affect is appropriate. Breasts: left breast: bruising in upper central breast with firmness . No other palpable masses  appreciated in the breasts or axillae bilaterally. Bilateral nipple retraction.   ECOG = 0  0 - Asymptomatic (Fully active, able to carry on all predisease activities without restriction)  1 - Symptomatic but completely ambulatory (Restricted in physically strenuous activity but ambulatory and able to carry out work of a light or sedentary nature. For example, light housework, office work)  2 - Symptomatic, <50% in bed during the day (Ambulatory and capable of all self care but unable to carry out any work activities. Up and about more than 50% of waking hours)  3 - Symptomatic, >50% in bed, but not bedbound (Capable of only limited self-care, confined to bed or chair 50% or more of waking hours)  4 - Bedbound (Completely disabled. Cannot carry on any self-care. Totally confined to bed or chair)  5 - Death   Eustace Pen MM, Creech RH, Tormey DC, et al. [phone]). "Toxicity and response criteria of the [facility]". [place] Oncol. 5 (6): 649-55   LABORATORY DATA:  Lab Results  Component Value Date   WBC 8.1 04/17/2022   HGB 13.7 04/17/2022   HCT  40.2 04/17/2022   MCV 93.5 04/17/2022   PLT 221 04/17/2022   CMP     Component Value Date/Time   NA 139 04/17/2022 1223   K 4.0 04/17/2022 1223   CL 103 04/17/2022 1223   CO2 30 04/17/2022 1223   GLUCOSE 119 (H) 04/17/2022 1223   BUN 10 04/17/2022 1223   CREATININE 0.70 04/17/2022 1223   CREATININE 0.69 01/27/2020 1013   CALCIUM 10.0 04/17/2022 1223   PROT 7.3 04/17/2022 1223   ALBUMIN 4.6 04/17/2022 1223   AST 17 04/17/2022 1223   ALT 16 04/17/2022 1223   ALKPHOS 62 04/17/2022 1223   BILITOT 0.5 04/17/2022 1223   GFRNONAA >60 04/17/2022 1223   GFRAA 97 12/08/2006 1058         RADIOGRAPHY: [place] AXILLA LEFT  Result Date: 04/16/2022 CLINICAL DATA:  67 year old female with recently diagnosed invasive lobular carcinoma post stereotactic guided biopsy of 2 sites of architectural distortion in the left breast  presents for sonographic evaluation of the left axilla. EXAM: ULTRASOUND OF THE LEFT AXILLA COMPARISON:  Previous exams. FINDINGS: Targeted ultrasound of the left axilla was performed. The visualized lymph nodes in the left axilla are of normal morphology. There are no morphologically abnormal lymph nodes identified in the left axilla. IMPRESSION: Normal ultrasound of the left axilla. RECOMMENDATION: Treatment plan for known left breast malignancy. I have discussed the findings and recommendations with the patient. If applicable, a reminder letter will be sent to the patient regarding the next appointment. BI-RADS CATEGORY  6: Known biopsy-proven malignancy. Electronically Signed   By: Everlean Alstrom M.D.   On: 04/16/2022 15:54  MM LT BREAST BX W LOC DEV 1ST LESION IMAGE BX SPEC STEREO GUIDE  Addendum Date: 04/10/2022   ADDENDUM REPORT: 04/10/2022 14:01 ADDENDUM: Pathology revealed GRADE 1 INVASIVE LOBULAR CARCINOMA, LOBULAR CARCINOMA IN SITU WITH FOCAL PAGETOID SPREAD, CLASSIC TYPE of the LEFT breast, superior aspect, (ribbon clip).This was found to be concordant by Dr. Lillia Mountain. Pathology revealed GRADE 1 INVASIVE LOBULAR CARCINOMA, FOCAL DUCT ECTASIA of the LEFT breast, superior aspect, anterior (coil clip). This was found to be concordant by Dr. Lillia Mountain. Pathology results were discussed with the patient by telephone. The patient reported doing well after the biopsies with tenderness at the sites. Post biopsy instructions and care were reviewed and questions were answered. The patient was encouraged to call [facility] for any additional concerns. The patient was referred to [facility] on April 17, 2022. The patient was asked to return for a left axillary ultrasound on April 16, 2022 at 3:20 PM - [place]. Bilateral breast MRI is recommended. There was an additional  area of possible distortion in the lateral aspect of the left breast seen on diagnostic study dated 03/21/2023 that could not be reproduced at the time of biopsy. Evaluation with mri is recommended. Pathology results reported by Stacie Acres RN on 04/10/2022. Electronically Signed   By: Lillia Mountain M.D.   On: 04/10/2022 14:01   Addendum Date: 04/08/2022   ADDENDUM REPORT: 04/08/2022 14:50 ADDENDUM: This is an addendum to stereotactic biopsy performed on 04/08/2022. In the body of the report it should state that using sterile technique and 1 % lidocaine and 1 % lidocaine with epinephrine as local anesthetic, under stereotactic guidance a 9 gauge vacuum assisted device was used to perform core needle biopsy of distortion in the superior aspect of the  LEFT breast (coil clip) using a superior to inferior approach. Electronically Signed   By: Lillia Mountain M.D.   On: 04/08/2022 14:50   Result Date: 04/10/2022 CLINICAL DATA:  Left breast architectural distortion. EXAM: LEFT BREAST STEREOTACTIC CORE NEEDLE BIOPSY COMPARISON:  Previous exam(s). FINDINGS: The patient and I discussed the procedure of stereotactic-guided biopsy including benefits and alternatives. We discussed the high likelihood of a successful procedure. We discussed the risks of the procedure including infection, bleeding, tissue injury, clip migration, and inadequate sampling. Informed written consent was given. The usual time out protocol was performed immediately prior to the procedure. Using sterile technique and 1% lidocaine and 1% lidocaine with epinephrine as local anesthetic, under stereotactic guidance, a 9 gauge vacuum assisted device was used to perform core needle biopsy of distortion in the superior aspect of the left breast (ribbon clip) using a superior to inferior approach. Lesion quadrant: Superior At the conclusion of the procedure, ribbon shaped tissue marker clip was deployed into the biopsy cavity. Follow-up 2-view mammogram was  performed and dictated separately. Using sterile technique and 1% lidocaine and 1% lidocaine with epinephrine as local anesthetic, under stereotactic guidance, a 9 gauge vacuum assisted device was used to perform core needle biopsy of distortion in the superior aspect of the right breast (coil clip) using a superior to inferior approach. Lesion quadrant: Superior (anterior) At the conclusion of the procedure, coil shaped tissue marker clip was deployed into the biopsy cavity. Follow-up 2-view mammogram was performed and dictated separately. IMPRESSION: Stereotactic-guided biopsies of the left breast. No apparent complications. The area of distortion in the lateral aspect of the breast that was described on the diagnostic exam dated 03/20/2022 was not seen at the time of biopsy. If the stereotactic guided core biopsies are benign short-term interval follow-up left mammogram in 6 months is recommended. Electronically Signed: By: Lillia Mountain M.D. On: 04/08/2022 13:00  MM LT BREAST BX W LOC DEV EA AD LESION IMG BX SPEC STEREO GUIDE  Addendum Date: 04/10/2022   ADDENDUM REPORT: 04/10/2022 14:01 ADDENDUM: Pathology revealed GRADE 1 INVASIVE LOBULAR CARCINOMA, LOBULAR CARCINOMA IN SITU WITH FOCAL PAGETOID SPREAD, CLASSIC TYPE of the LEFT breast, superior aspect, (ribbon clip).This was found to be concordant by Dr. Lillia Mountain. Pathology revealed GRADE 1 INVASIVE LOBULAR CARCINOMA, FOCAL DUCT ECTASIA of the LEFT breast, superior aspect, anterior (coil clip). This was found to be concordant by Dr. Lillia Mountain. Pathology results were discussed with the patient by telephone. The patient reported doing well after the biopsies with tenderness at the sites. Post biopsy instructions and care were reviewed and questions were answered. The patient was encouraged to call [facility] for any additional concerns. The patient was referred to [facility] on April 17, 2022. The patient was asked to return for a left axillary ultrasound on April 16, 2022 at 3:20 PM - [place]. Bilateral breast MRI is recommended. There was an additional area of possible distortion in the lateral aspect of the left breast seen on diagnostic study dated 03/21/2023 that could not be reproduced at the time of biopsy. Evaluation with mri is recommended. Pathology results reported by Stacie Acres RN on 04/10/2022. Electronically Signed   By: Lillia Mountain M.D.   On: 04/10/2022 14:01   Addendum Date: 04/08/2022   ADDENDUM REPORT: 04/08/2022 14:50 ADDENDUM: This is an addendum to stereotactic biopsy performed on 04/08/2022. In the body  of the report it should state that using sterile technique and 1 % lidocaine and 1 % lidocaine with epinephrine as local anesthetic, under stereotactic guidance a 9 gauge vacuum assisted device was used to perform core needle biopsy of distortion in the superior aspect of the LEFT breast (coil clip) using a superior to inferior approach. Electronically Signed   By: Lillia Mountain M.D.   On: 04/08/2022 14:50   Result Date: 04/10/2022 CLINICAL DATA:  Left breast architectural distortion. EXAM: LEFT BREAST STEREOTACTIC CORE NEEDLE BIOPSY COMPARISON:  Previous exam(s). FINDINGS: The patient and I discussed the procedure of stereotactic-guided biopsy including benefits and alternatives. We discussed the high likelihood of a successful procedure. We discussed the risks of the procedure including infection, bleeding, tissue injury, clip migration, and inadequate sampling. Informed written consent was given. The usual time out protocol was performed immediately prior to the procedure. Using sterile technique and 1% lidocaine and 1% lidocaine with epinephrine as local anesthetic, under stereotactic guidance, a 9 gauge vacuum assisted device was used to perform core needle biopsy of distortion in the superior aspect of  the left breast (ribbon clip) using a superior to inferior approach. Lesion quadrant: Superior At the conclusion of the procedure, ribbon shaped tissue marker clip was deployed into the biopsy cavity. Follow-up 2-view mammogram was performed and dictated separately. Using sterile technique and 1% lidocaine and 1% lidocaine with epinephrine as local anesthetic, under stereotactic guidance, a 9 gauge vacuum assisted device was used to perform core needle biopsy of distortion in the superior aspect of the right breast (coil clip) using a superior to inferior approach. Lesion quadrant: Superior (anterior) At the conclusion of the procedure, coil shaped tissue marker clip was deployed into the biopsy cavity. Follow-up 2-view mammogram was performed and dictated separately. IMPRESSION: Stereotactic-guided biopsies of the left breast. No apparent complications. The area of distortion in the lateral aspect of the breast that was described on the diagnostic exam dated 03/20/2022 was not seen at the time of biopsy. If the stereotactic guided core biopsies are benign short-term interval follow-up left mammogram in 6 months is recommended. Electronically Signed: By: Lillia Mountain M.D. On: 04/08/2022 13:00  MM CLIP PLACEMENT LEFT  Result Date: 04/08/2022 CLINICAL DATA:  Status post stereotactic core biopsies of left breast distortion. EXAM: 3D DIAGNOSTIC LEFT MAMMOGRAM POST ULTRASOUND BIOPSY COMPARISON:  Previous exam(s). FINDINGS: 3D Mammographic images were obtained following stereotactic core biopsies of left breast distortion. There is a ribbon and coil shaped clip in the upper aspect of the breast in appropriate position. IMPRESSION: Appropriate positioning of the ribbon and coil shaped clips in the left breast. If the biopsies are benign short-term interval follow-up left breast mammogram in 6 months is recommended. The previously described distortion in this area did not persist at the time of biopsy. Final Assessment:  Post Procedure Mammograms for Marker Placement Electronically Signed   By: Lillia Mountain M.D.   On: 04/08/2022 13:01  MM DIAG BREAST TOMO UNI LEFT  Result Date: 03/20/2022 CLINICAL DATA:  Patient returns today to evaluate a possible LEFT breast asymmetry questioned on recent screening mammogram. EXAM: DIGITAL DIAGNOSTIC UNILATERAL LEFT MAMMOGRAM WITH TOMOSYNTHESIS; ULTRASOUND LEFT BREAST LIMITED TECHNIQUE: Left digital diagnostic mammography and breast tomosynthesis was performed.; Targeted ultrasound examination of the left breast was performed. COMPARISON:  Previous exams including recent screening mammogram dated 03/05/2022. ACR Breast Density Category b: There are scattered areas of fibroglandular density. FINDINGS: On today's additional diagnostic views with spot compression and 3D tomosynthesis, architectural  distortion is seen within the upper retroareolar LEFT breast (retroareolar to 12 o'clock axis region) and there is additional possible subtle architectural distortion within the outer central LEFT breast (2-3 o'clock axis region). There is LEFT-sided nipple retraction that is new compared to older screening mammogram of 04/10/2011. Targeted ultrasound is performed, showing several small benign cysts in the LEFT breast, 11:00 to 1:00 axes, largest measuring 6 mm, corresponding as incidental findings. No suspicious-appearing findings and no definite sonographic correlate for the areas of architectural distortion appreciated in the central and outer LEFT breast IMPRESSION: 1. Suspicious architectural distortion within the central/retroareolar LEFT breast, best seen on spot compression CC slice 29, without sonographic correlate. Stereotactic biopsy is recommended. 2. Additional possible subtle architectural distortion within the outer LEFT breast, best seen on spot compression CC slice 46, without sonographic correlate. Stereotactic biopsy is recommended. RECOMMENDATION: 1. Stereotactic biopsy for the  suspicious architectural distortion within the central/retroareolar LEFT breast. 2. Stereotactic biopsy for the additional possible subtle architectural distortion within the outer LEFT breast. The 2-site stereotactic biopsies are scheduled on January 8th. I have discussed the findings and recommendations with the patient. If applicable, a reminder letter will be sent to the patient regarding the next appointment. BI-RADS CATEGORY  4: Suspicious. Electronically Signed   By: Franki Cabot M.D.   On: 03/20/2022 14:03  US BREAST LTD UNI LEFT INC AXILLA  Result Date: 03/20/2022 CLINICAL DATA:  Patient returns today to evaluate a possible LEFT breast asymmetry questioned on recent screening mammogram. EXAM: DIGITAL DIAGNOSTIC UNILATERAL LEFT MAMMOGRAM WITH TOMOSYNTHESIS; ULTRASOUND LEFT BREAST LIMITED TECHNIQUE: Left digital diagnostic mammography and breast tomosynthesis was performed.; Targeted ultrasound examination of the left breast was performed. COMPARISON:  Previous exams including recent screening mammogram dated 03/05/2022. ACR Breast Density Category b: There are scattered areas of fibroglandular density. FINDINGS: On today's additional diagnostic views with spot compression and 3D tomosynthesis, architectural distortion is seen within the upper retroareolar LEFT breast (retroareolar to 12 o'clock axis region) and there is additional possible subtle architectural distortion within the outer central LEFT breast (2-3 o'clock axis region). There is LEFT-sided nipple retraction that is new compared to older screening mammogram of 04/10/2011. Targeted ultrasound is performed, showing several small benign cysts in the LEFT breast, 11:00 to 1:00 axes, largest measuring 6 mm, corresponding as incidental findings. No suspicious-appearing findings and no definite sonographic correlate for the areas of architectural distortion appreciated in the central and outer LEFT breast IMPRESSION: 1. Suspicious architectural  distortion within the central/retroareolar LEFT breast, best seen on spot compression CC slice 29, without sonographic correlate. Stereotactic biopsy is recommended. 2. Additional possible subtle architectural distortion within the outer LEFT breast, best seen on spot compression CC slice 46, without sonographic correlate. Stereotactic biopsy is recommended. RECOMMENDATION: 1. Stereotactic biopsy for the suspicious architectural distortion within the central/retroareolar LEFT breast. 2. Stereotactic biopsy for the additional possible subtle architectural distortion within the outer LEFT breast. The 2-site stereotactic biopsies are scheduled on January 8th. I have discussed the findings and recommendations with the patient. If applicable, a reminder letter will be sent to the patient regarding the next appointment. BI-RADS CATEGORY  4: Suspicious. Electronically Signed   By: Franki Cabot M.D.   On: 03/20/2022 14:03     IMPRESSION/PLAN: This is a delightful 67 yo woman with left breast cancer   It was a pleasure meeting the patient today.  She is still not sure of her surgical options and is pending MRI of her breasts to help guide  that decision with her surgeon.  She understands that if she undergoes breast conserving surgery radiation therapy will be important to minimize the chance of recurrence in her breast later in her life.  She understands that if she undergoes mastectomy it is currently less clear if she will need radiation therapy.  This would need to be decided based on the final pathology, assessing her margin status, the size of the tumor, and the nodal status.    We discussed the risks, benefits, and side effects of radiotherapy.  We discussed that radiation would take approximately 4-6 weeks to complete and that I would give the patient a few weeks to heal following surgery before starting treatment planning.  If chemotherapy were to be given, this would precede radiotherapy. We spoke about acute  effects including skin irritation and fatigue as well as much less common late effects including internal organ injury or irritation. We spoke about the latest technology that is used to minimize the risk of late effects for patients undergoing radiotherapy to the breast or chest wall.  We discussed the risk of fibrosis and scar tissue that conform, particularly in the setting of breast reconstruction after mastectomy.  A minority of patients can have significant wound healing problems after postmastectomy breast reconstruction. No guarantees of treatment were given. The patient is enthusiastic about proceeding with treatment. I look forward to participating in the patient's care if warranted.  I will await her referral back to me for postoperative follow-up and eventual CT simulation/treatment planning as warranted.  On date of service, in total, I spent 50 minutes on this encounter. Patient was seen in person.   __________________________________________   Eppie Gibson, MD  This document serves as a record of services personally performed by Eppie Gibson, MD. It was created on her behalf by Roney Mans, a trained medical scribe. The creation of this record is based on the scribe's personal observations and the provider's statements to them. This document has been checked and approved by the attending provider.

## 2022-04-16 NOTE — Progress Notes (Signed)
[place]   [phone] [phone] [place] Note   Patient Care Team: Carollee Herter, Alferd Apa, DO as PCP - General Maisie Fus, MD (Inactive) as Consulting Physician (Obstetrics and Gynecology) Lavonna Monarch, MD (Inactive) as Consulting Physician (Dermatology) Latanya Maudlin, MD as Consulting Physician (Orthopedic Surgery) Rockwell Germany, RN as Oncology Nurse Navigator Mauro Kaufmann, RN as Oncology Nurse Navigator Truitt Merle, MD as Consulting Physician (Hematology) Eppie Gibson, MD as Attending Physician (Radiation Oncology) Jovita Kussmaul, MD as Consulting Physician (General Surgery)  Date of Service:  04/17/2022   CHIEF COMPLAINTS/PURPOSE OF CONSULTATION:   Breast Cancer, ER  REFERRING PHYSICIAN:  [facility]     ASSESSMENT & PLAN:  Diane Hodges is a 67 y.o. post-menopausal female with a history of Breast Cancer  Malignant neoplasm of overlapping sites of left breast, invasive lobular carcinoma, cTxN0M0, ER+/PR+/HER2-, G1 --We discussed her imaging findings and the biopsy results in great details. -Given the lobular carcinoma histology, and uncertain tumor size on mammogram and ultrasound, we recommend bilateral breast MRI with and without contrast for further evaluation. -Giving the early stage disease, she may be a candidate for lumpectomy, but that may be a possible possible if MRI shows a larger tumor.  She is agreeable with that. She was seen by Dr. Marlou Starks today and likely will proceed with surgery soon.  -I recommend a Oncotype Dx test on the surgical sample and we'll make a decision about adjuvant chemotherapy based on the Oncotype result. Written material of this test was given to her. She is overall fit, would be a good candidate for chemotherapy if her Oncotype recurrence score is high.  Given the low-grade disease, lobular histology, and a strong ER and PR positivity, I suspect this is low risk  disease. -Giving the strong ER and PR expression in her postmenopausal status, I recommend adjuvant endocrine therapy with aromatase inhibitor for a total of 10 years to reduce the risk of cancer recurrence. Potential benefits and side effects were discussed with patient and she is interested. -She was also seen by radiation oncologist Dr. Isidore Moos today.  Benefit of adjuvant radiation after lumpectomy for metabolic contributors. --We also discussed the breast cancer surveillance after her surgery. She will continue annual screening mammogram, self exam, and a routine office visit with lab and exam with [place]. -I encouraged her to have healthy diet and exercise regularly.  -Patient and her husband had many questions, I answered to their satisfaction.  2. Bone Health osteoporosis -Her most recent DEXA was 03/05/2022 was normal   PLAN:   - Breast MRI w wo contrast  -lab reviewed. - Discuss Surgery - Discuss antiestrogen therapy and side effects. -Oncotype test on surgical sample  -Discuss Genetic Test/ Research -I will see her at the end of her radiation, or sooner if needed.  Oncology History Overview Note   Cancer Staging  Malignant neoplasm of overlapping sites of left breast in female, estrogen receptor positive ([place]) Staging form: Breast, AJCC 8th Edition - Clinical stage from 04/08/2022: Stage Unknown (cTX, cN0, cM0, G1, ER+, PR+, HER2-) - Signed by Truitt Merle, MD on 04/16/2022 Stage prefix: Initial diagnosis Histologic grading system: 3 grade system     Malignant neoplasm of overlapping sites of left breast in female, estrogen receptor positive ([place])  03/20/2022 Imaging    IMPRESSION: 1. Suspicious architectural distortion within the central/retroareolar LEFT breast, best seen on spot compression CC slice 29, without sonographic correlate. Stereotactic  biopsy is recommended. 2. Additional possible subtle architectural distortion within the outer LEFT breast, best seen on spot  compression CC slice 46, without sonographic correlate. Stereotactic biopsy is recommended.     04/08/2022 Procedure    FINDINGS: 3D Mammographic images were obtained following stereotactic core biopsies of left breast distortion. There is a ribbon and coil shaped clip in the upper aspect of the breast in appropriate position.     04/16/2022 Initial Diagnosis   Malignant neoplasm of overlapping sites of left breast in female, estrogen receptor positive ([place])      HISTORY OF PRESENTING ILLNESS:  Diane Hodges 67 y.o. female is a here because of breast cancer. The patient was [facility] by [facility]. The patient presents to the clinic today accompanied by her family.  She had routine screening mammography on 03/20/2022 which showing a possible abnormality in the left breast.She underwent bilateral diagnostic mammography and  breast ultrasonography on 03/20/2022 showing: Suspicious architectural distortion within the center of left without sonographic correlate.  Patient underwent stereotactic biopsy of both areas, which showed similar results: GRADE 1 Invasive Lobular Carcinoma  Prognostic indicators significant for: estrogen receptor, 100% positive  and progesterone receptor, 100% positive . Proliferation marker Ki67 at 2%. HER2 negative.  Today the patient notes she feels well, denies any pain, or other symptoms.  She has good appetite and energy level.  She is a very pleasant active.  She has a PMHx of hypertension, Cholesterol, Thyroid disease.    Socially.Marland KitchenMarland KitchenRetired Theme park manager, Married    GYN HISTORY  Menarchal:10 LMP: mid 9 Contraceptive:YES 5 YEARS HRT: NONE GP:G2P2    REVIEW OF SYSTEMS:    Constitutional: Denies fevers, chills or abnormal night sweats Eyes: Denies blurriness of vision, double vision or watery eyes Ears, nose, mouth, throat, and face: Denies mucositis or sore throat Respiratory: Denies cough, dyspnea or wheezes Cardiovascular: Denies  palpitation, chest discomfort or lower extremity swelling Gastrointestinal:  Denies nausea, heartburn or change in bowel habits Skin: Denies abnormal skin rashes Lymphatics: Denies new lymphadenopathy or easy bruising Neurological:Denies numbness, tingling or new weaknesses Behavioral/Psych: Mood is stable, no new changes    All other systems were reviewed with the patient and are negative.   MEDICAL HISTORY:  Past Medical History:  Diagnosis Date   Atypical nevus 04/12/2014   mid back -mild   Hypertension    Thyroid disease    Hypothyridism    SURGICAL HISTORY: Past Surgical History:  Procedure Laterality Date   BREAST BIOPSY Left 04/08/2022   MM LT BREAST BX W LOC DEV EA AD LESION IMG BX SPEC STEREO GUIDE 04/08/2022 [facility]   BREAST BIOPSY Left 04/08/2022   MM LT BREAST BX W LOC DEV 1ST LESION IMAGE BX SPEC STEREO GUIDE 04/08/2022 [facility]   CATARACT EXTRACTION Right 2015   OOPHORECTOMY  1980    SOCIAL HISTORY: Social History   Socioeconomic History   Marital status: Married    Spouse name: Not on file   Number of children: 2   Years of education: Not on file   Highest education level: Not on file  Occupational History   Occupation: credit reporting    Employer: [employer]  Tobacco Use   Smoking status: Never   Smokeless tobacco: Never  Substance and Sexual Activity   Alcohol use: No   Drug use: No   Sexual activity: Yes    Partners: Male  Other Topics Concern   Not on file  Social History Narrative  Exercise-- none   Social Determinants of Health   Financial Resource Strain: Low Risk  (02/08/2021)   Overall Financial Resource Strain (CARDIA)    Difficulty of Paying Living Expenses: Not hard at all  Food Insecurity: No Food Insecurity (02/13/2022)   Hunger Vital Sign    Worried About Running Out of Food in the Last Year: Never true    Ran Out of Food in the Last Year: Never true  Transportation Needs: No Transportation Needs  (02/13/2022)   PRAPARE - Hydrologist (Medical): No    Lack of Transportation (Non-Medical): No  Physical Activity: Inactive (02/08/2021)   Exercise Vital Sign    Days of Exercise per Week: 0 days    Minutes of Exercise per Session: 0 min  Stress: No Stress Concern Present (02/08/2021)   [place]    Feeling of Stress : Not at all  Social Connections: [place] (02/08/2021)   Social Connection and Isolation Panel [NHANES]    Frequency of Communication with Friends and Family: More than three times a week    Frequency of Social Gatherings with Friends and Family: More than three times a week    Attends Religious Services: More than 4 times per year    Active Member of [employer] or Organizations: Yes    Attends Music therapist: More than 4 times per year    Marital Status: Married  Human resources officer Violence: Not At Risk (02/13/2022)   Humiliation, Afraid, Rape, and Kick questionnaire    Fear of Current or Ex-Partner: No    Emotionally Abused: No    Physically Abused: No    Sexually Abused: No    FAMILY HISTORY: Family History  Problem Relation Age of Onset   Cancer Mother        breast cancer or precancer   Hypertension Mother    Hypertension Father    Thyroid disease Other     ALLERGIES:  is allergic to azithromycin.  MEDICATIONS:  Current Outpatient Medications  Medication Sig Dispense Refill   blood glucose meter kit and supplies KIT Dispense based on patient and insurance preference. Use up to four times daily as directed. (FOR ICD-9 250.00, 250.01). 1 each 0   carvedilol (COREG) 6.25 MG tablet Take 1 tablet (6.25 mg total) by mouth 2 (two) times daily with a meal. 180 tablet 1   fenofibrate 54 MG tablet TAKE 1 TABLET BY MOUTH EVERY DAY 90 tablet 1   glucose blood test strip Onetouch verio test strips. Check blood sugar once a day 100 each 0    levothyroxine (SYNTHROID) 112 MCG tablet TAKE 1 TABLET BY MOUTH DAILY BEFORE BREAKFAST. 90 tablet 1   metFORMIN (GLUCOPHAGE) 500 MG tablet TAKE 1 TABLET BY MOUTH 2 TIMES DAILY WITH A MEAL. 180 tablet 1   Omega-3 Fatty Acids (FISH OIL) 1200 MG CAPS Take 1 capsule by mouth daily.     ONETOUCH DELICA LANCETS FINE MISC 1 Device by Does not apply route daily. 100 each 0   rosuvastatin (CRESTOR) 10 MG tablet Take 1 tablet (10 mg total) by mouth daily. 90 tablet 0   Semaglutide,0.25 or 0.'5MG'$ /DOS, 2 MG/3ML SOPN Inject 0.25 mg into the skin once a week. Increase to 0.'5mg'$  in one month. 3 mL 1   triamcinolone cream (KENALOG) 0.1 % APPLY TO AFFECTED AREA EVERY DAY  4   vitamin E 400 UNIT capsule Take 400 Units by mouth daily.  No current facility-administered medications for this visit.    PHYSICAL EXAMINATION: ECOG PERFORMANCE STATUS: 0 - Asymptomatic  Vitals:   04/17/22 1245  BP: (!) 148/76  Pulse: 91  Resp: 17  Temp: (!) 97.5 F (36.4 C)  SpO2: 98%   Filed Weights   04/17/22 1245  Weight: 205 lb 12.8 oz (93.4 kg)    GENERAL:alert, no distress and comfortable SKIN: skin color, texture, turgor are normal, no rashes or significant lesions EYES: normal, Conjunctiva are pink and non-injected, sclera clear LYMPH:(-)  no palpable lymphadenopathy in the cervical, axillary  LUNGS: clear to auscultation and percussion with normal breathing effort HEART: regular rate & rhythm and no murmurs and no lower extremity edema ABDOMEN:(-) abdomen soft, non-tender and normal bowel sounds Liver normal Musculoskeletal:no cyanosis of digits and no clubbing  NEURO: alert & oriented x 3 with fluent speech, no focal motor/sensory deficits BREAST:RT breast  No palpable mass, nodules or adenopathy bilaterally. Breast exam benign. Left breast area where the biopsy is hard. Lump 4-5 cm 3x5 upper lateral     LABORATORY DATA:  I have reviewed the data as listed    Latest Ref Rng & Units 04/17/2022   12:23 PM  02/28/2022   11:34 AM 07/30/2021   10:10 AM  CBC  WBC 4.0 - 10.5 K/uL 8.1  6.7  5.6   Hemoglobin 12.0 - 15.0 g/dL 13.7  13.8  13.6   Hematocrit 36.0 - 46.0 % 40.2  41.1  40.6   Platelets 150 - 400 K/uL 221  210.0  185.0        Latest Ref Rng & Units 04/17/2022   12:23 PM 02/28/2022   11:34 AM 11/08/2021   10:12 AM  CMP  Glucose 70 - 99 mg/dL 119  116  131   BUN 8 - 23 mg/dL '10  12  15   '$ Creatinine 0.44 - 1.00 mg/dL 0.70  0.68  0.80   Sodium 135 - 145 mmol/L 139  140  139   Potassium 3.5 - 5.1 mmol/L 4.0  4.2  4.0   Chloride 98 - 111 mmol/L 103  103  102   CO2 22 - 32 mmol/L '30  30  28   '$ Calcium 8.9 - 10.3 mg/dL 10.0  9.4  9.1   Total Protein 6.5 - 8.1 g/dL 7.3  7.2  6.9   Total Bilirubin 0.3 - 1.2 mg/dL 0.5  0.4  0.5   Alkaline Phos 38 - 126 U/L 62  57  54   AST 15 - 41 U/L '17  16  16   '$ ALT 0 - 44 U/L '16  16  14      '$ RADIOGRAPHIC STUDIES: I have personally reviewed the radiological images as listed and agreed with the findings in the report. [place] AXILLA LEFT  Result Date: 04/16/2022 CLINICAL DATA:  67 year old female with recently diagnosed invasive lobular carcinoma post stereotactic guided biopsy of 2 sites of architectural distortion in the left breast presents for sonographic evaluation of the left axilla. EXAM: ULTRASOUND OF THE LEFT AXILLA COMPARISON:  Previous exams. FINDINGS: Targeted ultrasound of the left axilla was performed. The visualized lymph nodes in the left axilla are of normal morphology. There are no morphologically abnormal lymph nodes identified in the left axilla. IMPRESSION: Normal ultrasound of the left axilla. RECOMMENDATION: Treatment plan for known left breast malignancy. I have discussed the findings and recommendations with the patient. If applicable, a reminder letter will be sent to the patient regarding the next  appointment. BI-RADS CATEGORY  6: Known biopsy-proven malignancy. Electronically Signed   By: Everlean Alstrom M.D.   On: 04/16/2022 15:54  MM  LT BREAST BX W LOC DEV 1ST LESION IMAGE BX SPEC STEREO GUIDE  Addendum Date: 04/10/2022   ADDENDUM REPORT: 04/10/2022 14:01 ADDENDUM: Pathology revealed GRADE 1 INVASIVE LOBULAR CARCINOMA, LOBULAR CARCINOMA IN SITU WITH FOCAL PAGETOID SPREAD, CLASSIC TYPE of the LEFT breast, superior aspect, (ribbon clip).This was found to be concordant by Dr. Lillia Mountain. Pathology revealed GRADE 1 INVASIVE LOBULAR CARCINOMA, FOCAL DUCT ECTASIA of the LEFT breast, superior aspect, anterior (coil clip). This was found to be concordant by Dr. Lillia Mountain. Pathology results were discussed with the patient by telephone. The patient reported doing well after the biopsies with tenderness at the sites. Post biopsy instructions and care were reviewed and questions were answered. The patient was encouraged to call [facility] for any additional concerns. The patient was referred to [facility] on April 17, 2022. The patient was asked to return for a left axillary ultrasound on April 16, 2022 at 3:20 PM - [place]. Bilateral breast MRI is recommended. There was an additional area of possible distortion in the lateral aspect of the left breast seen on diagnostic study dated 03/21/2023 that could not be reproduced at the time of biopsy. Evaluation with mri is recommended. Pathology results reported by Stacie Acres RN on 04/10/2022. Electronically Signed   By: Lillia Mountain M.D.   On: 04/10/2022 14:01   Addendum Date: 04/08/2022   ADDENDUM REPORT: 04/08/2022 14:50 ADDENDUM: This is an addendum to stereotactic biopsy performed on 04/08/2022. In the body of the report it should state that using sterile technique and 1 % lidocaine and 1 % lidocaine with epinephrine as local anesthetic, under stereotactic guidance a 9 gauge vacuum assisted device was used to perform core needle biopsy of distortion in the superior  aspect of the LEFT breast (coil clip) using a superior to inferior approach. Electronically Signed   By: Lillia Mountain M.D.   On: 04/08/2022 14:50   Result Date: 04/10/2022 CLINICAL DATA:  Left breast architectural distortion. EXAM: LEFT BREAST STEREOTACTIC CORE NEEDLE BIOPSY COMPARISON:  Previous exam(s). FINDINGS: The patient and I discussed the procedure of stereotactic-guided biopsy including benefits and alternatives. We discussed the high likelihood of a successful procedure. We discussed the risks of the procedure including infection, bleeding, tissue injury, clip migration, and inadequate sampling. Informed written consent was given. The usual time out protocol was performed immediately prior to the procedure. Using sterile technique and 1% lidocaine and 1% lidocaine with epinephrine as local anesthetic, under stereotactic guidance, a 9 gauge vacuum assisted device was used to perform core needle biopsy of distortion in the superior aspect of the left breast (ribbon clip) using a superior to inferior approach. Lesion quadrant: Superior At the conclusion of the procedure, ribbon shaped tissue marker clip was deployed into the biopsy cavity. Follow-up 2-view mammogram was performed and dictated separately. Using sterile technique and 1% lidocaine and 1% lidocaine with epinephrine as local anesthetic, under stereotactic guidance, a 9 gauge vacuum assisted device was used to perform core needle biopsy of distortion in the superior aspect of the right breast (coil clip) using a superior to inferior approach. Lesion quadrant: Superior (anterior) At the conclusion of the procedure, coil shaped tissue marker clip was deployed into the biopsy cavity. Follow-up 2-view mammogram was performed and  dictated separately. IMPRESSION: Stereotactic-guided biopsies of the left breast. No apparent complications. The area of distortion in the lateral aspect of the breast that was described on the diagnostic exam dated 03/20/2022  was not seen at the time of biopsy. If the stereotactic guided core biopsies are benign short-term interval follow-up left mammogram in 6 months is recommended. Electronically Signed: By: Lillia Mountain M.D. On: 04/08/2022 13:00  MM LT BREAST BX W LOC DEV EA AD LESION IMG BX SPEC STEREO GUIDE  Addendum Date: 04/10/2022   ADDENDUM REPORT: 04/10/2022 14:01 ADDENDUM: Pathology revealed GRADE 1 INVASIVE LOBULAR CARCINOMA, LOBULAR CARCINOMA IN SITU WITH FOCAL PAGETOID SPREAD, CLASSIC TYPE of the LEFT breast, superior aspect, (ribbon clip).This was found to be concordant by Dr. Lillia Mountain. Pathology revealed GRADE 1 INVASIVE LOBULAR CARCINOMA, FOCAL DUCT ECTASIA of the LEFT breast, superior aspect, anterior (coil clip). This was found to be concordant by Dr. Lillia Mountain. Pathology results were discussed with the patient by telephone. The patient reported doing well after the biopsies with tenderness at the sites. Post biopsy instructions and care were reviewed and questions were answered. The patient was encouraged to call [facility] for any additional concerns. The patient was referred to [facility] on April 17, 2022. The patient was asked to return for a left axillary ultrasound on April 16, 2022 at 3:20 PM - [place]. Bilateral breast MRI is recommended. There was an additional area of possible distortion in the lateral aspect of the left breast seen on diagnostic study dated 03/21/2023 that could not be reproduced at the time of biopsy. Evaluation with mri is recommended. Pathology results reported by Stacie Acres RN on 04/10/2022. Electronically Signed   By: Lillia Mountain M.D.   On: 04/10/2022 14:01   Addendum Date: 04/08/2022   ADDENDUM REPORT: 04/08/2022 14:50 ADDENDUM: This is an addendum to stereotactic biopsy performed on 04/08/2022. In the body of the report it should state that  using sterile technique and 1 % lidocaine and 1 % lidocaine with epinephrine as local anesthetic, under stereotactic guidance a 9 gauge vacuum assisted device was used to perform core needle biopsy of distortion in the superior aspect of the LEFT breast (coil clip) using a superior to inferior approach. Electronically Signed   By: Lillia Mountain M.D.   On: 04/08/2022 14:50   Result Date: 04/10/2022 CLINICAL DATA:  Left breast architectural distortion. EXAM: LEFT BREAST STEREOTACTIC CORE NEEDLE BIOPSY COMPARISON:  Previous exam(s). FINDINGS: The patient and I discussed the procedure of stereotactic-guided biopsy including benefits and alternatives. We discussed the high likelihood of a successful procedure. We discussed the risks of the procedure including infection, bleeding, tissue injury, clip migration, and inadequate sampling. Informed written consent was given. The usual time out protocol was performed immediately prior to the procedure. Using sterile technique and 1% lidocaine and 1% lidocaine with epinephrine as local anesthetic, under stereotactic guidance, a 9 gauge vacuum assisted device was used to perform core needle biopsy of distortion in the superior aspect of the left breast (ribbon clip) using a superior to inferior approach. Lesion quadrant: Superior At the conclusion of the procedure, ribbon shaped tissue marker clip was deployed into the biopsy cavity. Follow-up 2-view mammogram was performed and dictated separately. Using sterile technique and 1% lidocaine and 1% lidocaine with epinephrine as local anesthetic, under stereotactic guidance, a 9 gauge vacuum assisted device was used to perform core  needle biopsy of distortion in the superior aspect of the right breast (coil clip) using a superior to inferior approach. Lesion quadrant: Superior (anterior) At the conclusion of the procedure, coil shaped tissue marker clip was deployed into the biopsy cavity. Follow-up 2-view mammogram was performed  and dictated separately. IMPRESSION: Stereotactic-guided biopsies of the left breast. No apparent complications. The area of distortion in the lateral aspect of the breast that was described on the diagnostic exam dated 03/20/2022 was not seen at the time of biopsy. If the stereotactic guided core biopsies are benign short-term interval follow-up left mammogram in 6 months is recommended. Electronically Signed: By: Lillia Mountain M.D. On: 04/08/2022 13:00  MM CLIP PLACEMENT LEFT  Result Date: 04/08/2022 CLINICAL DATA:  Status post stereotactic core biopsies of left breast distortion. EXAM: 3D DIAGNOSTIC LEFT MAMMOGRAM POST ULTRASOUND BIOPSY COMPARISON:  Previous exam(s). FINDINGS: 3D Mammographic images were obtained following stereotactic core biopsies of left breast distortion. There is a ribbon and coil shaped clip in the upper aspect of the breast in appropriate position. IMPRESSION: Appropriate positioning of the ribbon and coil shaped clips in the left breast. If the biopsies are benign short-term interval follow-up left breast mammogram in 6 months is recommended. The previously described distortion in this area did not persist at the time of biopsy. Final Assessment: Post Procedure Mammograms for Marker Placement Electronically Signed   By: Lillia Mountain M.D.   On: 04/08/2022 13:01  MM DIAG BREAST TOMO UNI LEFT  Result Date: 03/20/2022 CLINICAL DATA:  Patient returns today to evaluate a possible LEFT breast asymmetry questioned on recent screening mammogram. EXAM: DIGITAL DIAGNOSTIC UNILATERAL LEFT MAMMOGRAM WITH TOMOSYNTHESIS; ULTRASOUND LEFT BREAST LIMITED TECHNIQUE: Left digital diagnostic mammography and breast tomosynthesis was performed.; Targeted ultrasound examination of the left breast was performed. COMPARISON:  Previous exams including recent screening mammogram dated 03/05/2022. ACR Breast Density Category b: There are scattered areas of fibroglandular density. FINDINGS: On today's additional  diagnostic views with spot compression and 3D tomosynthesis, architectural distortion is seen within the upper retroareolar LEFT breast (retroareolar to 12 o'clock axis region) and there is additional possible subtle architectural distortion within the outer central LEFT breast (2-3 o'clock axis region). There is LEFT-sided nipple retraction that is new compared to older screening mammogram of 04/10/2011. Targeted ultrasound is performed, showing several small benign cysts in the LEFT breast, 11:00 to 1:00 axes, largest measuring 6 mm, corresponding as incidental findings. No suspicious-appearing findings and no definite sonographic correlate for the areas of architectural distortion appreciated in the central and outer LEFT breast IMPRESSION: 1. Suspicious architectural distortion within the central/retroareolar LEFT breast, best seen on spot compression CC slice 29, without sonographic correlate. Stereotactic biopsy is recommended. 2. Additional possible subtle architectural distortion within the outer LEFT breast, best seen on spot compression CC slice 44, without sonographic correlate. Stereotactic biopsy is recommended. RECOMMENDATION: 1. Stereotactic biopsy for the suspicious architectural distortion within the central/retroareolar LEFT breast. 2. Stereotactic biopsy for the additional possible subtle architectural distortion within the outer LEFT breast. The 2-site stereotactic biopsies are scheduled on January 8th. I have discussed the findings and recommendations with the patient. If applicable, a reminder letter will be sent to the patient regarding the next appointment. BI-RADS CATEGORY  4: Suspicious. Electronically Signed   By: Franki Cabot M.D.   On: 03/20/2022 14:03  US BREAST LTD UNI LEFT INC AXILLA  Result Date: 03/20/2022 CLINICAL DATA:  Patient returns today to evaluate a possible LEFT breast asymmetry questioned on recent  screening mammogram. EXAM: DIGITAL DIAGNOSTIC UNILATERAL LEFT  MAMMOGRAM WITH TOMOSYNTHESIS; ULTRASOUND LEFT BREAST LIMITED TECHNIQUE: Left digital diagnostic mammography and breast tomosynthesis was performed.; Targeted ultrasound examination of the left breast was performed. COMPARISON:  Previous exams including recent screening mammogram dated 03/05/2022. ACR Breast Density Category b: There are scattered areas of fibroglandular density. FINDINGS: On today's additional diagnostic views with spot compression and 3D tomosynthesis, architectural distortion is seen within the upper retroareolar LEFT breast (retroareolar to 12 o'clock axis region) and there is additional possible subtle architectural distortion within the outer central LEFT breast (2-3 o'clock axis region). There is LEFT-sided nipple retraction that is new compared to older screening mammogram of 04/10/2011. Targeted ultrasound is performed, showing several small benign cysts in the LEFT breast, 11:00 to 1:00 axes, largest measuring 6 mm, corresponding as incidental findings. No suspicious-appearing findings and no definite sonographic correlate for the areas of architectural distortion appreciated in the central and outer LEFT breast IMPRESSION: 1. Suspicious architectural distortion within the central/retroareolar LEFT breast, best seen on spot compression CC slice 29, without sonographic correlate. Stereotactic biopsy is recommended. 2. Additional possible subtle architectural distortion within the outer LEFT breast, best seen on spot compression CC slice 46, without sonographic correlate. Stereotactic biopsy is recommended. RECOMMENDATION: 1. Stereotactic biopsy for the suspicious architectural distortion within the central/retroareolar LEFT breast. 2. Stereotactic biopsy for the additional possible subtle architectural distortion within the outer LEFT breast. The 2-site stereotactic biopsies are scheduled on January 8th. I have discussed the findings and recommendations with the patient. If applicable, a  reminder letter will be sent to the patient regarding the next appointment. BI-RADS CATEGORY  4: Suspicious. Electronically Signed   By: Franki Cabot M.D.   On: 03/20/2022 14:03    No orders of the defined types were placed in this encounter.   All questions were answered. The patient knows to call the clinic with any problems, questions or concerns. The total time spent in the appointment was 60 minutes.     Truitt Merle, MD 04/17/2022   I, Audry Riles, am acting as scribe for Truitt Merle, MD.   I have reviewed the above documentation for accuracy and completeness, and I agree with the above.

## 2022-04-17 ENCOUNTER — Encounter: Payer: Self-pay | Admitting: Hematology

## 2022-04-17 ENCOUNTER — Other Ambulatory Visit: Payer: Self-pay

## 2022-04-17 ENCOUNTER — Ambulatory Visit
Admission: RE | Admit: 2022-04-17 | Discharge: 2022-04-17 | Disposition: A | Discharge status: Home / Self Care | Payer: Medicare Other | Source: Ambulatory Visit | Attending: Radiation Oncology | Admitting: Radiation Oncology

## 2022-04-17 ENCOUNTER — Encounter: Payer: Self-pay | Admitting: *Deleted

## 2022-04-17 ENCOUNTER — Encounter: Payer: Self-pay | Admitting: Physical Therapy

## 2022-04-17 ENCOUNTER — Encounter: Payer: Self-pay | Admitting: General Practice

## 2022-04-17 ENCOUNTER — Inpatient Hospital Stay (HOSPITAL_BASED_OUTPATIENT_CLINIC_OR_DEPARTMENT_OTHER): Payer: Medicare Other | Admitting: Hematology

## 2022-04-17 ENCOUNTER — Ambulatory Visit: Payer: Medicare Other | Attending: General Surgery | Admitting: Physical Therapy

## 2022-04-17 ENCOUNTER — Other Ambulatory Visit: Payer: Self-pay | Admitting: *Deleted

## 2022-04-17 ENCOUNTER — Inpatient Hospital Stay: Payer: Medicare Other | Attending: Hematology

## 2022-04-17 VITALS — BP 148/76 | HR 91 | Temp 97.5°F | Resp 17 | Wt 205.8 lb

## 2022-04-17 DIAGNOSIS — Z170 Estrogen receptor positive status [ER+]: Secondary | ICD-10-CM | POA: Insufficient documentation

## 2022-04-17 DIAGNOSIS — C50812 Malignant neoplasm of overlapping sites of left female breast: Secondary | ICD-10-CM

## 2022-04-17 DIAGNOSIS — R293 Abnormal posture: Secondary | ICD-10-CM | POA: Diagnosis not present

## 2022-04-17 DIAGNOSIS — Z7984 Long term (current) use of oral hypoglycemic drugs: Secondary | ICD-10-CM | POA: Insufficient documentation

## 2022-04-17 DIAGNOSIS — I10 Essential (primary) hypertension: Secondary | ICD-10-CM | POA: Insufficient documentation

## 2022-04-17 DIAGNOSIS — E079 Disorder of thyroid, unspecified: Secondary | ICD-10-CM | POA: Insufficient documentation

## 2022-04-17 DIAGNOSIS — C50112 Malignant neoplasm of central portion of left female breast: Secondary | ICD-10-CM

## 2022-04-17 DIAGNOSIS — Z79899 Other long term (current) drug therapy: Secondary | ICD-10-CM | POA: Diagnosis not present

## 2022-04-17 LAB — CBC WITH DIFFERENTIAL (CANCER CENTER ONLY)
Abs Immature Granulocytes: 0.02 10*3/uL (ref 0.00–0.07)
Basophils Absolute: 0.1 10*3/uL (ref 0.0–0.1)
Basophils Relative: 1 %
Eosinophils Absolute: 0.2 10*3/uL (ref 0.0–0.5)
Eosinophils Relative: 2 %
HCT: 40.2 % (ref 36.0–46.0)
Hemoglobin: 13.7 g/dL (ref 12.0–15.0)
Immature Granulocytes: 0 %
Lymphocytes Relative: 42 %
Lymphs Abs: 3.4 10*3/uL (ref 0.7–4.0)
MCH: 31.9 pg (ref 26.0–34.0)
MCHC: 34.1 g/dL (ref 30.0–36.0)
MCV: 93.5 fL (ref 80.0–100.0)
Monocytes Absolute: 0.5 10*3/uL (ref 0.1–1.0)
Monocytes Relative: 6 %
Neutro Abs: 3.9 10*3/uL (ref 1.7–7.7)
Neutrophils Relative %: 49 %
Platelet Count: 221 10*3/uL (ref 150–400)
RBC: 4.3 MIL/uL (ref 3.87–5.11)
RDW: 12.8 % (ref 11.5–15.5)
WBC Count: 8.1 10*3/uL (ref 4.0–10.5)
nRBC: 0 % (ref 0.0–0.2)

## 2022-04-17 LAB — CMP (CANCER CENTER ONLY)
ALT: 16 U/L (ref 0–44)
AST: 17 U/L (ref 15–41)
Albumin: 4.6 g/dL (ref 3.5–5.0)
Alkaline Phosphatase: 62 U/L (ref 38–126)
Anion gap: 6 (ref 5–15)
BUN: 10 mg/dL (ref 8–23)
CO2: 30 mmol/L (ref 22–32)
Calcium: 10 mg/dL (ref 8.9–10.3)
Chloride: 103 mmol/L (ref 98–111)
Creatinine: 0.7 mg/dL (ref 0.44–1.00)
GFR, Estimated: >60 mL/min (ref >60)
Glucose, Bld: 119 mg/dL — ABNORMAL HIGH (ref 70–99)
Potassium: 4 mmol/L (ref 3.5–5.1)
Sodium: 139 mmol/L (ref 135–145)
Total Bilirubin: 0.5 mg/dL (ref 0.3–1.2)
Total Protein: 7.3 g/dL (ref 6.5–8.1)

## 2022-04-17 LAB — GENETIC SCREENING ORDER

## 2022-04-17 NOTE — Therapy (Signed)
OUTPATIENT PHYSICAL THERAPY BREAST CANCER BASELINE EVALUATION   Patient Name: Azaria Stegman MRN: [id number] DOB:March 26, 1956, 67 y.o., female Today's Date: 04/17/2022  END OF SESSION:  PT End of Session - 04/17/22 1457     Visit Number 1    Number of Visits 2    Date for PT Re-Evaluation 06/12/22    PT Start Time 1519    PT Stop Time 3329    PT Time Calculation (min) 29 min    Activity Tolerance Patient tolerated treatment well    Behavior During Therapy [facility] for tasks assessed/performed             Past Medical History:  Diagnosis Date   Atypical nevus 04/12/2014   mid back -mild   Hypertension    Thyroid disease    Hypothyridism   Past Surgical History:  Procedure Laterality Date   BREAST BIOPSY Left 04/08/2022   MM LT BREAST BX W LOC DEV EA AD LESION IMG BX SPEC STEREO GUIDE 04/08/2022 [facility]   BREAST BIOPSY Left 04/08/2022   MM LT BREAST BX W LOC DEV 1ST LESION IMAGE BX SPEC STEREO GUIDE 04/08/2022 [facility]   CATARACT EXTRACTION Right 2015   OOPHORECTOMY  1980   Patient Active Problem List   Diagnosis Date Noted   Malignant neoplasm of overlapping sites of left breast in female, estrogen receptor positive ([place]) 04/16/2022   Preventative health care 07/30/2021   Uncontrolled type 2 diabetes mellitus with hyperglycemia ([place]) 06/26/2020   Anxiety 02/20/2013   Obesity (BMI 30-39.9) 02/19/2013   Hyperlipidemia 10/16/2009   Essential hypertension 12/26/2008   ALLERGIC RHINITIS 11/12/2007   COMMON MIGRAINE 12/08/2006   SKIN RASH 12/08/2006   Hypothyroidism 10/20/2006   BRONCHITIS, ACUTE 10/20/2006    REFERRING PROVIDER: Dr. Autumn Messing  REFERRING DIAG: Left breast cancer  THERAPY DIAG:  Malignant neoplasm of overlapping sites of left breast in female, estrogen receptor positive ([place])  Abnormal posture  Rationale for Evaluation and Treatment: Rehabilitation  ONSET DATE: 03/05/2022  SUBJECTIVE:                                                                                                                                                                                            SUBJECTIVE STATEMENT: Patient reports she is here today to be seen by her medical team for her newly diagnosed left breast cancer.   PERTINENT HISTORY:  Patient was diagnosed on 03/05/2022 with left grade 1 invasive lobular carcinoma breast cancer. It is a distortion so it is difficult to measure and is located in overlapping quadrants. It is ER/PR positive and HER2 negative with a Ki67  of 2%.   PATIENT GOALS:   reduce lymphedema risk and learn post op HEP.   PAIN:  Are you having pain? No  PRECAUTIONS: Active CA   HAND DOMINANCE: right  WEIGHT BEARING RESTRICTIONS: No  FALLS:  Has patient fallen in last 6 months? No  LIVING ENVIRONMENT: Patient lives with: her husband Lives in: House/apartment Has following equipment at home: None  OCCUPATION: Retired  LEISURE: She does not exercise  PRIOR LEVEL OF FUNCTION: Independent   OBJECTIVE:  COGNITION: Overall cognitive status: Within functional limits for tasks assessed    POSTURE:  Forward head and rounded shoulders posture  UPPER EXTREMITY AROM/PROM:  A/PROM RIGHT   eval   Shoulder extension 58  Shoulder flexion 158  Shoulder abduction 170  Shoulder internal rotation 62  Shoulder external rotation 80    (Blank rows = not tested)  A/PROM LEFT   eval  Shoulder extension 70  Shoulder flexion 148  Shoulder abduction 165  Shoulder internal rotation 66  Shoulder external rotation 87    (Blank rows = not tested)  CERVICAL AROM: All within normal limits  UPPER EXTREMITY STRENGTH: WNL  LYMPHEDEMA ASSESSMENTS:   LANDMARK RIGHT   eval  10 cm proximal to olecranon process 37.3  Olecranon process 29  10 cm proximal to ulnar styloid process 25.8  Just proximal to ulnar styloid process 18.6  Across hand at thumb web space 19.2  At base of 2nd digit 6.3  (Blank rows = not  tested)  LANDMARK LEFT   eval  10 cm proximal to olecranon process 35.2  Olecranon process 27.4  10 cm proximal to ulnar styloid process 24.5  Just proximal to ulnar styloid process 17.5  Across hand at thumb web space 19.4  At base of 2nd digit 6.3  (Blank rows = not tested)  L-DEX LYMPHEDEMA SCREENING:  The patient was assessed using the L-Dex machine today to produce a lymphedema index baseline score. The patient will be reassessed on a regular basis (typically every 3 months) to obtain new L-Dex scores. If the score is > 6.5 points away from his/her baseline score indicating onset of subclinical lymphedema, it will be recommended to wear a compression garment for 4 weeks, 12 hours per day and then be reassessed. If the score continues to be > 6.5 points from baseline at reassessment, we will initiate lymphedema treatment. Assessing in this manner has a 95% rate of preventing clinically significant lymphedema.   L-DEX FLOWSHEETS - 04/17/22 1400       L-DEX LYMPHEDEMA SCREENING   Measurement Type Unilateral    L-DEX MEASUREMENT EXTREMITY Upper Extremity    POSITION  Standing    DOMINANT SIDE Right    At Risk Side Left    BASELINE SCORE (UNILATERAL) -0.1              PATIENT EDUCATION:  Education details: Lymphedema risk reduction and post op shoulder/posture HEP Person educated: Patient Education method: Explanation, Demonstration, Handout Education comprehension: Patient verbalized understanding and returned demonstration  HOME EXERCISE PROGRAM: Patient was instructed today in a home exercise program today for post op shoulder range of motion. These included active assist shoulder flexion in sitting, scapular retraction, wall walking with shoulder abduction, and hands behind head external rotation.  She was encouraged to do these twice a day, holding 3 seconds and repeating 5 times when permitted by her physician.   ASSESSMENT:  CLINICAL IMPRESSION: Patient was  diagnosed on 03/05/2022 with left grade 1 invasive lobular carcinoma  breast cancer. It is a distortion so it is difficult to measure and is located in overlapping quadrants. It is ER/PR positive and HER2 negative with a Ki67 of 2%.  Her multidisciplinary medical team met prior to her assessments to determine a recommended treatment plan. She is planning to have a left lumpectomy or mastectomy with a sentinel node biopsy followed by radiation and anti-estrogen therapy. She will benefit from a post op PT reassessment to determine needs and from L-Dex screens every 3 months for 2 years to detect subclinical lymphedema.  Pt will benefit from skilled therapeutic intervention to improve on the following deficits: Decreased knowledge of precautions, impaired UE functional use, pain, decreased ROM, postural dysfunction.   PT treatment/interventions: ADL/self-care home management, pt/family education, therapeutic exercise  REHAB POTENTIAL: Excellent  CLINICAL DECISION MAKING: Stable/uncomplicated  EVALUATION COMPLEXITY: Low   GOALS: Goals reviewed with patient? YES  LONG TERM GOALS: (STG=LTG)    Name Target Date Goal status  1 Pt will be able to verbalize understanding of pertinent lymphedema risk reduction practices relevant to her dx specifically related to skin care.  Baseline:  No knowledge 04/17/2022 Achieved at eval  2 Pt will be able to return demo and/or verbalize understanding of the post op HEP related to regaining shoulder ROM. Baseline:  No knowledge 04/17/2022 Achieved at eval  3 Pt will be able to verbalize understanding of the importance of attending the post op After Breast CA Class for further lymphedema risk reduction education and therapeutic exercise.  Baseline:  No knowledge 04/17/2022 Achieved at eval  4 Pt will demo she has regained full shoulder ROM and function post operatively compared to baselines.  Baseline: See objective measurements taken today. 06/12/2022      PLAN:  PT FREQUENCY/DURATION: EVAL and 1 follow up appointment.   PLAN FOR NEXT SESSION: will reassess 3-4 weeks post op to determine needs.   Patient will follow up at outpatient cancer rehab 3-4 weeks following surgery.  If the patient requires physical therapy at that time, a specific plan will be dictated and sent to the referring physician for approval. The patient was educated today on appropriate basic range of motion exercises to begin post operatively and the importance of attending the After Breast Cancer class following surgery.  Patient was educated today on lymphedema risk reduction practices as it pertains to recommendations that will benefit the patient immediately following surgery.  She verbalized good understanding.    Physical Therapy Information for After Breast Cancer Surgery/Treatment:  Lymphedema is a swelling condition that you may be at risk for in your arm if you have lymph nodes removed from the armpit area.  After a sentinel node biopsy, the risk is approximately 5-9% and is higher after an axillary node dissection.  There is treatment available for this condition and it is not life-threatening.  Contact your physician or physical therapist with concerns. You may begin the 4 shoulder/posture exercises (see additional sheet) when permitted by your physician (typically a week after surgery).  If you have drains, you may need to wait until those are removed before beginning range of motion exercises.  A general recommendation is to not lift your arms above shoulder height until drains are removed.  These exercises should be done to your tolerance and gently.  This is not a "no pain/no gain" type of recovery so listen to your body and stretch into the range of motion that you can tolerate, stopping if you have pain.  If you are having  immediate reconstruction, ask your plastic surgeon about doing exercises as he or she may want you to wait. We encourage you to attend the  free one time ABC (After Breast Cancer) class offered by [facility].  You will learn information related to lymphedema risk, prevention and treatment and additional exercises to regain mobility following surgery.  You can call [phone] for more information.  This is offered the 1st and 3rd Monday of each month.  You only attend the class one time. While undergoing any medical procedure or treatment, try to avoid blood pressure being taken or needle sticks from occurring on the arm on the side of cancer.   This recommendation begins after surgery and continues for the rest of your life.  This may help reduce your risk of getting lymphedema (swelling in your arm). An excellent resource for those seeking information on lymphedema is the National Lymphedema Network's web site. It can be accessed at [place].org If you notice swelling in your hand, arm or breast at any time following surgery (even if it is many years from now), please contact your doctor or physical therapist to discuss this.  Lymphedema can be treated at any time but it is easier for you if it is treated early on.  If you feel like your shoulder motion is not returning to normal in a reasonable amount of time, please contact your surgeon or physical therapist.  [place] [phone]. [address], [address], [place] [address]  ABC CLASS After Breast Cancer Class  After Breast Cancer Class is a specially designed exercise class to assist you in a safe recover after having breast cancer surgery.  In this class you will learn how to get back to full function whether your drains were just removed or if you had surgery a month ago.  This one-time class is held the 1st and 3rd Monday of every month from 11:00 a.m. until 12:00 noon virtually.  This class is FREE and space is limited. For more information or to register for the next available class, call [phone].  Class Goals  Understand specific stretches to improve the flexibility of you chest and shoulder. Learn ways to safely strengthen your upper body and improve your posture. Understand the warning signs of infection and why you may be at risk for an arm infection. Learn about Lymphedema and prevention.  ** You do not attend this class until after surgery.  Drains must be removed to participate  Patient was instructed today in a home exercise program today for post op shoulder range of motion. These included active assist shoulder flexion in sitting, scapular retraction, wall walking with shoulder abduction, and hands behind head external rotation.  She was encouraged to do these twice a day, holding 3 seconds and repeating 5 times when permitted by her physician.   Annia Friendly, [place] 04/17/22 3:55 PM

## 2022-04-17 NOTE — Research (Signed)
[employer] 2021-05 - Specimen Collection Study to Evaluate Biomarkers in Subjects with Cancer     Patient Diane Hodges was identified by Dr. Burr Medico as a potential candidate for the above listed study.  This Clinical Research Coordinator met with Yahira Timberman, [id number], on 04/17/22 in a manner and location that ensures patient privacy to discuss participation in the above listed research study.  Patient is Accompanied by Husband and Daughter .  A copy of the informed consent document with embedded HIPAA language was provided to the patient.  Patient reads, speaks, and understands [place].   Patient was provided with the business card of this Coordinator and encouraged to contact the research team with any questions.  Approximately 15 minutes were spent with the patient reviewing the informed consent documents.  Patient was provided the option of taking informed consent documents home to review and was encouraged to review at their convenience with their support network, including other care providers. Patient took the consent documents home to review.   Will follow-up Monday, 22Jan2024, to discuss study interest.   Johny Drilling, [facility] 04/17/2022 4:51 PM

## 2022-04-18 ENCOUNTER — Telehealth: Payer: Self-pay | Admitting: Genetic Counselor

## 2022-04-18 ENCOUNTER — Other Ambulatory Visit: Payer: Self-pay | Admitting: *Deleted

## 2022-04-18 ENCOUNTER — Encounter: Payer: Self-pay | Admitting: Genetic Counselor

## 2022-04-18 DIAGNOSIS — C50812 Malignant neoplasm of overlapping sites of left female breast: Secondary | ICD-10-CM

## 2022-04-18 NOTE — Progress Notes (Signed)
[place] Psychosocial Distress Screening Spiritual Care  Met with Diane Hodges, husband Diane Hodges, and daughter Diane Hodges in Breast Multidisciplinary Clinic to introduce [facility] team/resources, reviewing distress screen per protocol.  The patient scored a 1 on the Psychosocial Distress Thermometer which indicates mild distress. Also assessed for distress and other psychosocial needs.      04/18/2022   10:46 AM  ONCBCN DISTRESS SCREENING  Screening Type Initial Screening  Distress experienced in past week (1-10) 0  Referral to support programs Yes   Chaplain and patient discussed common feeling and emotions when being diagnosed with cancer, and the importance of support during treatment.  Chaplain informed patient of the support team and support services at [facility].  Chaplain provided contact information and encouraged patient to call with any questions or concerns.  Follow up needed: Yes.   [name] welcomes a follow-up call in ca two weeks for pastoral check-in.   [place], [place], [facility] Pager [phone] Voicemail [phone]

## 2022-04-18 NOTE — Telephone Encounter (Addendum)
Diane Hodges was seen by a genetic counselor during the breast multidisciplinary clinic on 04/17/2022. In addition to her personal history of breast cancer, she reported her mother possibly had breast cancer and her father has a history of melanoma. She does not meet NCCN criteria for genetic testing at this time. She was still offered genetic counseling and testing but declined. We encourage her to contact us if there are any changes to her personal or family history of cancer. If she meets NCCN criteria based on the updated personal/family history, she would be recommended to have genetic counseling and testing.   Lucille Passy, MS, [facility] Genetic Counselor [email or web address] (P) [phone]

## 2022-04-19 ENCOUNTER — Telehealth: Payer: Self-pay | Admitting: *Deleted

## 2022-04-19 NOTE — Telephone Encounter (Signed)
Connected with Adara Mandrell's daughter Diane Hodges [phone]) regarding [id number] LOA emailed 04/18/2022 to [email or web address].  Requested type of leave, start and end dates, and asked to resubmit form.  Attachments are too dark for use and facsimile return.  [place] addressed to patient's daughter should be addressed to third party [facility] per HR return fax number [phone]).  Forms may be emailed, attached to patient portal message, or delivered to [facility] receptionist Mon. - Fri.  or leave with weekend clinic staff. "Pictures look fine on my end.  You can read them.  Spoke with a nurse who advised me of forms process and the office can lighten them.  Will correct the ROI form.  I work Mon - Fri. Will the information be secured on the weekends."  No confirmation received of this nurse advising leaving forms with weekend staff, instructing to place forms in Form Staff work area for pick-up or comparison of Intermittent and continual leave.  Therefore, this nurse expressed ability to obtain WHD-380-E, PDF online.   Lauren asked "if current form may be held until further work-up for treatment plan and surgery dates to set start, end dates for types of FMLA.  It should be an open range of time for whatever is needed.  I do not know what I'll need.. Trying to get this in as soon as possible.  My employer is several weeks behind on LOA authorizations."   "   No further questions or needs.  Treatment plan pending workup, oncotype and further discussion with pending follow up.

## 2022-04-22 ENCOUNTER — Telehealth: Payer: Self-pay

## 2022-04-22 DIAGNOSIS — C50812 Malignant neoplasm of overlapping sites of left female breast: Secondary | ICD-10-CM

## 2022-04-22 NOTE — Telephone Encounter (Signed)
[employer] 2021-05 - Specimen Collection Study to Evaluate Biomarkers in Subjects with Cancer    Called the patient to discuss their interest in participating in the [employer] 2021-05 study, but they did not answer. I left a message with my name and number and will try and follow-up with them during this week.   Johny Drilling, [facility] 04/22/2022 4:26 PM

## 2022-04-24 ENCOUNTER — Ambulatory Visit
Admission: RE | Admit: 2022-04-24 | Discharge: 2022-04-24 | Disposition: A | Discharge status: Home / Self Care | Payer: Medicare Other | Source: Ambulatory Visit | Attending: Hematology | Admitting: Hematology

## 2022-04-24 DIAGNOSIS — Z170 Estrogen receptor positive status [ER+]: Secondary | ICD-10-CM

## 2022-04-24 DIAGNOSIS — D0502 Lobular carcinoma in situ of left breast: Secondary | ICD-10-CM | POA: Diagnosis not present

## 2022-04-24 MED ORDER — GADOPICLENOL 0.5 MMOL/ML IV SOLN
10.0000 mL | Freq: Once | INTRAVENOUS | Status: AC | PRN
  Administered 2022-04-24: 10 mL via INTRAVENOUS

## 2022-04-25 ENCOUNTER — Encounter: Payer: Self-pay | Admitting: *Deleted

## 2022-04-25 ENCOUNTER — Telehealth: Payer: Self-pay | Admitting: *Deleted

## 2022-04-25 ENCOUNTER — Other Ambulatory Visit: Payer: Self-pay

## 2022-04-25 DIAGNOSIS — C50812 Malignant neoplasm of overlapping sites of left female breast: Secondary | ICD-10-CM

## 2022-04-25 NOTE — Telephone Encounter (Signed)
Spoke with patient to follow up from [facility] 1/17 and asess navigation needs. Reviewed the MRI results with her and informed her of the possibility of add bx's (LN and breast). Discussed that Dr. Marlou Starks needs to review and discuss with her his recommendations and that I have sent him a message. Patient verbalized understanding.

## 2022-04-25 NOTE — Progress Notes (Signed)
Per staff message from Dr. Burr Medico to order US guided core biopsy of left axillary node of breast.

## 2022-04-29 ENCOUNTER — Other Ambulatory Visit: Payer: Self-pay | Admitting: Family Medicine

## 2022-04-29 ENCOUNTER — Encounter: Payer: Self-pay | Admitting: *Deleted

## 2022-04-29 ENCOUNTER — Other Ambulatory Visit: Payer: Self-pay | Admitting: Hematology

## 2022-04-29 DIAGNOSIS — Z170 Estrogen receptor positive status [ER+]: Secondary | ICD-10-CM

## 2022-04-30 ENCOUNTER — Other Ambulatory Visit: Payer: Self-pay | Admitting: General Surgery

## 2022-04-30 DIAGNOSIS — C50812 Malignant neoplasm of overlapping sites of left female breast: Secondary | ICD-10-CM

## 2022-04-30 DIAGNOSIS — R928 Other abnormal and inconclusive findings on diagnostic imaging of breast: Secondary | ICD-10-CM

## 2022-05-02 ENCOUNTER — Encounter: Payer: Self-pay | Admitting: General Practice

## 2022-05-02 NOTE — Progress Notes (Signed)
[place] Spiritual Care Note  Attempted [place] follow-up phone call, leaving voicemail with encouragement to return call.   [place], [place], [facility] Pager [phone] Voicemail [phone]

## 2022-05-07 ENCOUNTER — Ambulatory Visit
Admission: RE | Admit: 2022-05-07 | Discharge: 2022-05-07 | Disposition: A | Discharge status: Home / Self Care | Payer: Medicare Other | Source: Ambulatory Visit | Attending: Hematology | Admitting: Hematology

## 2022-05-07 DIAGNOSIS — Z170 Estrogen receptor positive status [ER+]: Secondary | ICD-10-CM

## 2022-05-07 DIAGNOSIS — C801 Malignant (primary) neoplasm, unspecified: Secondary | ICD-10-CM | POA: Diagnosis not present

## 2022-05-07 DIAGNOSIS — C773 Secondary and unspecified malignant neoplasm of axilla and upper limb lymph nodes: Secondary | ICD-10-CM | POA: Diagnosis not present

## 2022-05-07 DIAGNOSIS — R590 Localized enlarged lymph nodes: Secondary | ICD-10-CM | POA: Diagnosis not present

## 2022-05-08 ENCOUNTER — Encounter: Payer: Self-pay | Admitting: Plastic Surgery

## 2022-05-08 ENCOUNTER — Ambulatory Visit: Payer: Medicare Other | Admitting: Plastic Surgery

## 2022-05-08 VITALS — BP 144/80 | HR 89 | Ht 65.0 in | Wt 204.8 lb

## 2022-05-08 DIAGNOSIS — Z170 Estrogen receptor positive status [ER+]: Secondary | ICD-10-CM

## 2022-05-08 DIAGNOSIS — E1165 Type 2 diabetes mellitus with hyperglycemia: Secondary | ICD-10-CM

## 2022-05-08 DIAGNOSIS — C50812 Malignant neoplasm of overlapping sites of left female breast: Secondary | ICD-10-CM

## 2022-05-08 DIAGNOSIS — C50112 Malignant neoplasm of central portion of left female breast: Secondary | ICD-10-CM | POA: Diagnosis not present

## 2022-05-08 DIAGNOSIS — Z0000 Encounter for general adult medical examination without abnormal findings: Secondary | ICD-10-CM

## 2022-05-08 NOTE — Progress Notes (Signed)
Patient ID: Diane Hodges, female    DOB: 1955/12/09, 67 y.o.   MRN: [id number]   Chief Complaint  Patient presents with   Advice Only   Breast Cancer    The patient is a 67 year old female here for consultation for breast reconstruction.  The patient had a mammogram in December which showed architectural distortion in the upper retroareolar left breast and outer central left breast to the left of the nipple.  She had some nipple retraction as well.  An ultrasound was done which led to apices January 8.  The biopsy showed grade 1 invasive lobular carcinoma that was estrogen and progesterone positive with a Ki-67 of 2% and HER2 negative.  The patient is 5 feet 5 inches tall and weighs 204 pounds. Her preoperative bra size is a B/C.  She would like to be around the same size.  She has a past medical history of atypical nevus, hypertension, thyroid disease and diabetes.  Past surgical history is positive for new nephrectomy and cataract extraction.  She is not a smoker.  She is seeing Dr. Marlou Starks.  She has an MRI scheduled for tomorrow so we will wait to find out the results before she will make her final decision.  She is trying to decide between a lumpectomy of the left breast versus mastectomy of the left breast.     Review of Systems  Constitutional: Negative.   HENT: Negative.    Eyes: Negative.   Respiratory: Negative.  Negative for chest tightness and shortness of breath.   Cardiovascular: Negative.   Gastrointestinal: Negative.   Endocrine: Negative.   Genitourinary: Negative.   Musculoskeletal: Negative.   Skin: Negative.     Past Medical History:  Diagnosis Date   Atypical nevus 04/12/2014   mid back -mild   Hypertension    Thyroid disease    Hypothyridism    Past Surgical History:  Procedure Laterality Date   BREAST BIOPSY Left 04/08/2022   MM LT BREAST BX W LOC DEV EA AD LESION IMG BX SPEC STEREO GUIDE 04/08/2022 [facility]   BREAST BIOPSY Left 04/08/2022   MM  LT BREAST BX W LOC DEV 1ST LESION IMAGE BX SPEC STEREO GUIDE 04/08/2022 [facility]   CATARACT EXTRACTION Right 2015   OOPHORECTOMY  1980      Current Outpatient Medications:    blood glucose meter kit and supplies KIT, Dispense based on patient and insurance preference. Use up to four times daily as directed. (FOR ICD-9 250.00, 250.01)., Disp: 1 each, Rfl: 0   carvedilol (COREG) 6.25 MG tablet, Take 1 tablet (6.25 mg total) by mouth 2 (two) times daily with a meal., Disp: 180 tablet, Rfl: 1   fenofibrate 54 MG tablet, TAKE 1 TABLET BY MOUTH EVERY DAY, Disp: 90 tablet, Rfl: 1   glucose blood test strip, Onetouch verio test strips. Check blood sugar once a day, Disp: 100 each, Rfl: 0   levothyroxine (SYNTHROID) 112 MCG tablet, TAKE 1 TABLET BY MOUTH DAILY BEFORE BREAKFAST., Disp: 90 tablet, Rfl: 1   metFORMIN (GLUCOPHAGE) 500 MG tablet, TAKE 1 TABLET BY MOUTH TWICE A DAY WITH FOOD, Disp: 180 tablet, Rfl: 1   Multiple Vitamin (MULTIVITAMIN ADULT) TABS, Take by mouth., Disp: , Rfl:    Omega-3 Fatty Acids (FISH OIL) 1200 MG CAPS, Take 1 capsule by mouth daily., Disp: , Rfl:    ONETOUCH DELICA LANCETS FINE MISC, 1 Device by Does not apply route daily., Disp: 100 each, Rfl: 0  PROTEIN PO, Take by mouth., Disp: , Rfl:    rosuvastatin (CRESTOR) 10 MG tablet, Take 1 tablet (10 mg total) by mouth daily., Disp: 90 tablet, Rfl: 0   Semaglutide,0.25 or 0.'5MG'$ /DOS, 2 MG/3ML SOPN, Inject 0.25 mg into the skin once a week. Increase to 0.'5mg'$  in one month., Disp: 3 mL, Rfl: 1   triamcinolone cream (KENALOG) 0.1 %, APPLY TO AFFECTED AREA EVERY DAY, Disp: , Rfl: 4   VITAMIN C, CALCIUM ASCORBATE, PO, Take by mouth., Disp: , Rfl:    VITAMIN D, CHOLECALCIFEROL, PO, Take by mouth., Disp: , Rfl:    vitamin E 400 UNIT capsule, Take 400 Units by mouth daily., Disp: , Rfl:    Objective:   Vitals:   05/08/22 1005  BP: (!) 144/80  Pulse: 89  SpO2: 97%    Physical Exam Vitals and nursing note reviewed.   Constitutional:      Appearance: Normal appearance.  HENT:     Head: Normocephalic and atraumatic.  Cardiovascular:     Rate and Rhythm: Normal rate.     Pulses: Normal pulses.  Pulmonary:     Effort: Pulmonary effort is normal.  Abdominal:     General: There is no distension.     Palpations: Abdomen is soft.     Tenderness: There is no abdominal tenderness.  Musculoskeletal:        General: No swelling or deformity.  Skin:    General: Skin is warm.     Capillary Refill: Capillary refill takes less than 2 seconds.     Coloration: Skin is not jaundiced.     Findings: No bruising.  Neurological:     Mental Status: She is alert and oriented to person, place, and time.  Psychiatric:        Mood and Affect: Mood normal.        Behavior: Behavior normal.        Thought Content: Thought content normal.        Judgment: Judgment normal.     Assessment & Plan:  Carcinoma of central portion of left breast in female, estrogen receptor positive ([place])  Preventative health care  Malignant neoplasm of overlapping sites of left breast in female, estrogen receptor positive ([place])  Uncontrolled type 2 diabetes mellitus with hyperglycemia ([place])  The options for reconstruction we explained to the patient / family for breast reconstruction.  There are two general categories of reconstruction.  We can reconstruction a breast with implants or use the patient's own tissue.  These were further discussed as listed.  Breast reconstruction is an optional procedure and eligibility depends on the full spectrum of the health of the patient and any co-morbidities.  More than one surgery is often needed to complete the reconstruction process.  The process can take three to twelve months to complete.  The breasts will not be identical due to many factors such as rib differences, shoulder asymmetry and treatments such as radiation.  The goal is to get the breasts to look normal and symmetrical in clothes.   Scars are a part of surgery and may fade some in time but will always be present under clothes.  Surgery may be an option on the non-cancer breast to achieve more symmetry.  No matter which procedure is chosen there is always the risk of complications and even failure of the body to heal.  This could result in no breast.    The options for reconstruction include:  1. Placement of a tissue expander  with Acellular dermal matrix. When the expander is the desired size surgery is performed to remove the expander and place an implant.  In some cases the implant can be placed without an expander.  2. Autologous reconstruction can include using a muscle or tissue from another area of the body to create a breast.  3. Combined procedures (ie. latissismus dorsi flap) can be done with an expander / implant placed under the muscle.   The risks, benefits, scars and recovery time were discussed for each of the above. Risks include bleeding, infection, hematoma, seroma, scarring, pain, wound healing complications, flap loss, fat necrosis, capsular contracture, need for implant removal, donor site complications, bulge, hernia, umbilical necrosis, need for urgent reoperation, and need for dressing changes.   The procedure the patient selected / that was best for the patient, was then discussed in further detail.  Total time: 45 minutes. This includes time spent with the patient during the visit as well as time spent before and after the visit reviewing the chart, documenting the encounter, making phone calls and reviewing studies.   We will plan to do a virtual visit next week once we have the results of the MRI.  We spoke along about implant-based reconstruction which is where she is leaning especially if she has a mastectomy.  Her husband was with her.  We will also send her the breast reconstruction pamphlet.  I have spoken to Dr. Marlou Starks about the above information.  Pictures were obtained of the patient and placed in  the chart with the patient's or guardian's permission.   [place], DO

## 2022-05-09 ENCOUNTER — Encounter: Payer: Self-pay | Admitting: *Deleted

## 2022-05-10 ENCOUNTER — Ambulatory Visit
Admission: RE | Admit: 2022-05-10 | Discharge: 2022-05-10 | Disposition: A | Discharge status: Home / Self Care | Payer: Medicare Other | Source: Ambulatory Visit | Attending: General Surgery | Admitting: General Surgery

## 2022-05-10 DIAGNOSIS — R928 Other abnormal and inconclusive findings on diagnostic imaging of breast: Secondary | ICD-10-CM

## 2022-05-10 DIAGNOSIS — C50812 Malignant neoplasm of overlapping sites of left female breast: Secondary | ICD-10-CM

## 2022-05-10 DIAGNOSIS — C50512 Malignant neoplasm of lower-outer quadrant of left female breast: Secondary | ICD-10-CM | POA: Diagnosis not present

## 2022-05-10 MED ORDER — GADOPICLENOL 0.5 MMOL/ML IV SOLN
10.0000 mL | Freq: Once | INTRAVENOUS | Status: AC | PRN
  Administered 2022-05-10: 10 mL via INTRAVENOUS

## 2022-05-13 ENCOUNTER — Encounter: Payer: Self-pay | Admitting: *Deleted

## 2022-05-14 ENCOUNTER — Encounter: Payer: Self-pay | Admitting: *Deleted

## 2022-05-14 ENCOUNTER — Ambulatory Visit: Payer: Self-pay | Admitting: General Surgery

## 2022-05-14 DIAGNOSIS — Z170 Estrogen receptor positive status [ER+]: Secondary | ICD-10-CM

## 2022-05-14 MED ORDER — KETOROLAC TROMETHAMINE 15 MG/ML IJ SOLN: 15.0000 mg | Freq: Once | INTRAMUSCULAR | Status: AC

## 2022-05-17 ENCOUNTER — Telehealth (INDEPENDENT_AMBULATORY_CARE_PROVIDER_SITE_OTHER): Payer: Medicare Other | Admitting: Plastic Surgery

## 2022-05-17 ENCOUNTER — Encounter: Payer: Self-pay | Admitting: Plastic Surgery

## 2022-05-17 ENCOUNTER — Ambulatory Visit: Payer: Self-pay | Admitting: General Surgery

## 2022-05-17 DIAGNOSIS — Z170 Estrogen receptor positive status [ER+]: Secondary | ICD-10-CM

## 2022-05-17 DIAGNOSIS — C50812 Malignant neoplasm of overlapping sites of left female breast: Secondary | ICD-10-CM | POA: Diagnosis not present

## 2022-05-17 DIAGNOSIS — C50112 Malignant neoplasm of central portion of left female breast: Secondary | ICD-10-CM

## 2022-05-17 MED ORDER — KETOROLAC TROMETHAMINE 15 MG/ML IJ SOLN: 15.0000 mg | Freq: Once | INTRAMUSCULAR | Status: AC

## 2022-05-17 NOTE — Progress Notes (Signed)
   Subjective:    Patient ID: Diane Hodges, female    DOB: March 13, 1956, 67 y.o.   MRN: [id number]  The patient is joining me by virtual visit she is a 67 year old female.  Her husband is with her.  This is for further discussion about breast reconstruction.  Her history is such that she had a mammogram in December which showed architectural distortion which led to an ultrasound and then an MRI last week.  The diagnosis is invasive lobular carcinoma of the left breast.  It is estrogen and progesterone positive with a Ki-67 of 2% and HER2 negative.  Lymph node is involved.  She is 5 feet 5 inches tall weighs 204 pounds.  Her preoperative bra size is a B/C cup.  She wants to be around the same size.  Her past medical history is positive for atypical nevus, hypertension, thyroid disease and diabetes.  She has undergone a nephrectomy and cataract extraction.  She is not a smoker.  She is seeing Dr. Marlou Starks who is going to reach out to her for further discussion about whether or not to do a lumpectomy versus mastectomy.  The disease is in the central portion of the breast.     Review of Systems  Constitutional: Negative.   HENT: Negative.    Eyes: Negative.   Respiratory: Negative.    Cardiovascular: Negative.   Gastrointestinal: Negative.   Endocrine: Negative.   Genitourinary: Negative.   Musculoskeletal: Negative.        Objective:   Physical Exam     Assessment & Plan:     ICD-10-CM   1. Malignant neoplasm of overlapping sites of left breast in female, estrogen receptor positive ([place])  C50.812    Z17.0     2. Carcinoma of central portion of left breast in female, estrogen receptor positive ([place])  C50.112    Z17.0       I connected with  [name] on 05/17/22 by a video enabled telemedicine application and verified that I am speaking with the correct person using two identifiers.  The patient was at home and I was at the office.  We spent 10 minutes in discussion.  I also  spoke with Dr. Marlou Starks who is going to call the patient.  We will talk again with the patient after she has spoken with Dr. Marlou Starks.   I discussed the limitations of evaluation and management by telemedicine. The patient expressed understanding and agreed to proceed.

## 2022-05-19 ENCOUNTER — Other Ambulatory Visit: Payer: Self-pay | Admitting: Hematology

## 2022-05-19 DIAGNOSIS — Z170 Estrogen receptor positive status [ER+]: Secondary | ICD-10-CM

## 2022-05-22 ENCOUNTER — Telehealth: Payer: Self-pay | Admitting: Hematology

## 2022-05-22 NOTE — Telephone Encounter (Signed)
Spoke to the patient to give her appointment and location of CT scan also gave prep instructions as well, I also called radiology to get arrival time  to make sure I understood what time patient should arrive for this appointment and was told 15 to 30 minutes before appointment, enough time to register, I gave patient those instructions as well.

## 2022-05-23 ENCOUNTER — Encounter: Payer: Self-pay | Admitting: *Deleted

## 2022-05-23 ENCOUNTER — Encounter (HOSPITAL_COMMUNITY)
Admission: RE | Admit: 2022-05-23 | Discharge: 2022-05-23 | Disposition: A | Discharge status: Home / Self Care | Payer: Medicare Other | Source: Ambulatory Visit | Attending: Hematology | Admitting: Hematology

## 2022-05-23 DIAGNOSIS — Z170 Estrogen receptor positive status [ER+]: Secondary | ICD-10-CM | POA: Diagnosis not present

## 2022-05-23 DIAGNOSIS — C50912 Malignant neoplasm of unspecified site of left female breast: Secondary | ICD-10-CM | POA: Diagnosis not present

## 2022-05-23 DIAGNOSIS — C50812 Malignant neoplasm of overlapping sites of left female breast: Secondary | ICD-10-CM | POA: Insufficient documentation

## 2022-05-23 MED ORDER — TECHNETIUM TC 99M MEDRONATE IV KIT
20.0000 | PACK | Freq: Once | INTRAVENOUS | Status: AC | PRN
  Administered 2022-05-23: 22 via INTRAVENOUS

## 2022-05-27 ENCOUNTER — Other Ambulatory Visit: Payer: Self-pay | Admitting: *Deleted

## 2022-05-27 DIAGNOSIS — Z170 Estrogen receptor positive status [ER+]: Secondary | ICD-10-CM

## 2022-05-27 DIAGNOSIS — R948 Abnormal results of function studies of other organs and systems: Secondary | ICD-10-CM

## 2022-05-28 ENCOUNTER — Telehealth: Payer: Self-pay | Admitting: Hematology

## 2022-05-28 ENCOUNTER — Encounter: Payer: Self-pay | Admitting: *Deleted

## 2022-05-28 ENCOUNTER — Telehealth: Payer: Self-pay | Admitting: Plastic Surgery

## 2022-05-28 NOTE — Telephone Encounter (Signed)
Spoke with pt and let her know that Dr. Marla Roe still wants to speak with her on my chart appt on 3/1.  Pt. Thanked me for letting her know.

## 2022-05-28 NOTE — Telephone Encounter (Signed)
Contacted patient to scheduled appointments. Patient is aware of appointments that are scheduled.   

## 2022-05-31 ENCOUNTER — Telehealth: Payer: Medicare Other | Admitting: Plastic Surgery

## 2022-05-31 ENCOUNTER — Encounter: Payer: Self-pay | Admitting: Plastic Surgery

## 2022-05-31 ENCOUNTER — Telehealth: Payer: Self-pay | Admitting: Plastic Surgery

## 2022-05-31 DIAGNOSIS — Z170 Estrogen receptor positive status [ER+]: Secondary | ICD-10-CM | POA: Diagnosis not present

## 2022-05-31 DIAGNOSIS — C50112 Malignant neoplasm of central portion of left female breast: Secondary | ICD-10-CM | POA: Diagnosis not present

## 2022-05-31 DIAGNOSIS — C50812 Malignant neoplasm of overlapping sites of left female breast: Secondary | ICD-10-CM | POA: Diagnosis not present

## 2022-05-31 NOTE — Telephone Encounter (Signed)
Per Dr. Myrtha Mantis - called patient - she has spoken to dr.Toth -- he will reach out to them at first of week to discuss plans.  She said ok

## 2022-05-31 NOTE — Progress Notes (Signed)
   Subjective:    Patient ID: Diane Hodges, female    DOB: [birth year], 67 y.o.   MRN: [id number]  The patient is a 67 year old female joining me by phone for further discussion about her breast surgery.  Her husband is with her.  The conversation started off strained.  The patient and husband have expressed frustration that they are not getting enough information or being told what is happening.  This is just despite the multiple phone calls where messages were left and a long discussion 2 Fridays ago with both myself and then with Dr. Marlou Starks.  They were going to cancel today's appointment per the message I received but I asked to keep it so that I could touch base with them and talk about their plans.  The patient had a mammogram in December which was abnormal and led to a further workup.  She was diagnosed with invasive lobular carcinoma of the left breast and that is estrogen and progesterone positive with a Ki-67 of 2% and HER2 negative.  It is believed that she does have lymph node involvement.  She is 5 feet 5 inches tall and weighs 204 pounds.  Her preoperative bra size is a B/C cup and she wants to be around the same size.  She has hypertension, thyroid disease and diabetes.  Past surgical history includes a nephrectomy and cataract surgery.  She is not a smoker.  Her disease is in the central portion of the breast and a mastectomy is advised.  She is scheduled for a MRI of her spine for tomorrow.      Review of Systems  Constitutional: Negative.   HENT: Negative.    Eyes: Negative.   Respiratory: Negative.    Cardiovascular: Negative.   Gastrointestinal: Negative.   Endocrine: Negative.   Genitourinary: Negative.        Objective:   Physical Exam        Assessment & Plan:     ICD-10-CM   1. Malignant neoplasm of overlapping sites of left breast in female, estrogen receptor positive ([place])  C50.812    Z17.0     2. Carcinoma of central portion of left breast in female,  estrogen receptor positive ([place])  C50.112    Z17.0         I connected with  Diane Hodges on 05/31/22 by phone and verified that I am speaking with the correct person using two identifiers.  We spent 5 minutes in discussion.  The patient was at home and I was at the office.   I discussed the limitations of evaluation and management by telemedicine. The patient expressed understanding and agreed to proceed.  The conversation was strained and I called Dr. Marlou Starks and shared with him the patient's concerns and my concerns.  He is going to reach out to them next week.  I asked Lattie Haw to call and let the family know.  Furthermore I will not plan on having anymore phone visits with them.  They will need to come back in and see me prior to the surgery as they feel that they have not had enough information and I want to be sure that all their questions were answered and that they are fully aware of the surgery, plan, complications and what to expect.  Their health and wellbeing is my primary concern.

## 2022-06-01 ENCOUNTER — Ambulatory Visit (HOSPITAL_COMMUNITY)
Admission: RE | Admit: 2022-06-01 | Discharge: 2022-06-01 | Disposition: A | Discharge status: Home / Self Care | Payer: Medicare Other | Source: Ambulatory Visit | Attending: Hematology | Admitting: Hematology

## 2022-06-01 DIAGNOSIS — Z170 Estrogen receptor positive status [ER+]: Secondary | ICD-10-CM | POA: Insufficient documentation

## 2022-06-01 DIAGNOSIS — R948 Abnormal results of function studies of other organs and systems: Secondary | ICD-10-CM | POA: Diagnosis not present

## 2022-06-01 DIAGNOSIS — M4802 Spinal stenosis, cervical region: Secondary | ICD-10-CM | POA: Diagnosis not present

## 2022-06-01 DIAGNOSIS — R600 Localized edema: Secondary | ICD-10-CM | POA: Diagnosis not present

## 2022-06-01 DIAGNOSIS — C50812 Malignant neoplasm of overlapping sites of left female breast: Secondary | ICD-10-CM

## 2022-06-01 DIAGNOSIS — M5127 Other intervertebral disc displacement, lumbosacral region: Secondary | ICD-10-CM | POA: Diagnosis not present

## 2022-06-01 MED ORDER — GADOBUTROL 1 MMOL/ML IV SOLN
9.0000 mL | Freq: Once | INTRAVENOUS | Status: AC | PRN
  Administered 2022-06-01: 9 mL via INTRAVENOUS

## 2022-06-04 ENCOUNTER — Telehealth: Payer: Self-pay | Admitting: *Deleted

## 2022-06-04 ENCOUNTER — Encounter: Payer: Self-pay | Admitting: *Deleted

## 2022-06-04 NOTE — Telephone Encounter (Signed)
Spoke with Patient and Husband to schedule pre-op and post-op appointments. Patient also requested female PA for PA appointments.

## 2022-06-05 ENCOUNTER — Ambulatory Visit (HOSPITAL_COMMUNITY)
Admission: RE | Admit: 2022-06-05 | Discharge: 2022-06-05 | Disposition: A | Discharge status: Home / Self Care | Payer: Medicare Other | Source: Ambulatory Visit | Attending: Hematology | Admitting: Hematology

## 2022-06-05 ENCOUNTER — Other Ambulatory Visit: Payer: Self-pay | Admitting: General Surgery

## 2022-06-05 DIAGNOSIS — E1165 Type 2 diabetes mellitus with hyperglycemia: Secondary | ICD-10-CM | POA: Diagnosis not present

## 2022-06-05 DIAGNOSIS — C50812 Malignant neoplasm of overlapping sites of left female breast: Secondary | ICD-10-CM | POA: Insufficient documentation

## 2022-06-05 DIAGNOSIS — R911 Solitary pulmonary nodule: Secondary | ICD-10-CM | POA: Diagnosis not present

## 2022-06-05 DIAGNOSIS — Z170 Estrogen receptor positive status [ER+]: Secondary | ICD-10-CM | POA: Diagnosis not present

## 2022-06-05 DIAGNOSIS — K5730 Diverticulosis of large intestine without perforation or abscess without bleeding: Secondary | ICD-10-CM | POA: Diagnosis not present

## 2022-06-05 LAB — POCT I-STAT CREATININE: Creatinine, Ser: 0.7 mg/dL (ref 0.44–1.00)

## 2022-06-05 MED ORDER — IOHEXOL 300 MG/ML  SOLN
100.0000 mL | Freq: Once | INTRAMUSCULAR | Status: AC | PRN
  Administered 2022-06-05: 100 mL via INTRAVENOUS

## 2022-06-05 MED ORDER — SODIUM CHLORIDE (PF) 0.9 % IJ SOLN
INTRAMUSCULAR | Status: AC
  Filled 2022-06-05: qty 50

## 2022-06-06 NOTE — Assessment & Plan Note (Addendum)
invasive lobular carcinoma, cT3N1M0, stage IIA, ER+/PR+/HER2-, G1  -diagnosed in 04/2022 -Given the lobular histology, she underwent breast MRI, which showed 6.1 cm mass in the central of left breast, with 1 abnormal axillary lymph node. -Staging bone scan showed abnormal uptake in L2 and L3, we obtained spinal MRI to evaluate the bone metastasis which was negative. -CT chest abdomen pelvis with contrast was negative for metastatic disease. -I reviewed the above imaging findings with patient in detail.  All questions were answered -She is scheduled to have left breast mastectomy and Lymph node dissection on June 24, 2022. -If she has 3 or less positive lymph nodes on surgical path, will obtain Oncotype Dx to see if she needs adjuvant chemotherapy.  However if she has 4 or more positive lymph nodes, then I will recommend adjuvant chemotherapy due to high risk of recurrence.  She voiced good understanding and agrees with the plan. -I will see her after surgery.

## 2022-06-07 ENCOUNTER — Inpatient Hospital Stay: Payer: Medicare Other | Attending: Hematology | Admitting: Hematology

## 2022-06-07 DIAGNOSIS — C50812 Malignant neoplasm of overlapping sites of left female breast: Secondary | ICD-10-CM

## 2022-06-07 DIAGNOSIS — Z170 Estrogen receptor positive status [ER+]: Secondary | ICD-10-CM

## 2022-06-07 NOTE — Progress Notes (Signed)
[facility]   [phone] [phone]) [phone]   Clinic Follow up Note   Patient Care Team: Carollee Herter, Alferd Apa, DO as PCP - General Latanya Maudlin, MD as Consulting Physician (Orthopedic Surgery) Rockwell Germany, RN as Oncology Nurse Navigator Mauro Kaufmann, RN as Oncology Nurse Navigator Truitt Merle, MD as Consulting Physician (Hematology) Eppie Gibson, MD as Attending Physician (Radiation Oncology) Jovita Kussmaul, MD as Consulting Physician (General Surgery) 06/07/2022  I connected with '@PTNAME'$ @ on 06/07/22 at  9:40 AM EST by telephone and verified that I am speaking with the correct person using two identifiers.   I discussed the limitations, risks, security and privacy concerns of performing an evaluation and management service by telephone and the availability of in person appointments. I also discussed with the patient that there may be a patient responsible charge related to this service. The patient expressed understanding and agreed to proceed.   Patient's location:  home  Provider's location:  Office    CHIEF COMPLAINT: discuss images results    CURRENT THERAPY: pending surgery   ASSESSMENT & PLAN:  Malignant neoplasm of overlapping sites of left breast in female, estrogen receptor positive ([place]) invasive lobular carcinoma, cT3N1M0, stage IIA, ER+/PR+/HER2-, G1  -diagnosed in 04/2022 -Given the lobular histology, she underwent breast MRI, which showed 6.1 cm mass in the central of left breast, with 1 abnormal axillary lymph node. -Staging bone scan showed abnormal uptake in L2 and L3, we obtained spinal MRI to evaluate the bone metastasis which was negative. -CT chest abdomen pelvis with contrast was negative for metastatic disease. -I reviewed the above imaging findings with patient in detail.  All questions were answered -She is scheduled to have left breast mastectomy and Lymph node dissection on June 24, 2022. -If she has 3 or less  positive lymph nodes on surgical path, will obtain Oncotype Dx to see if she needs adjuvant chemotherapy.  However if she has 4 or more positive lymph nodes, then I will recommend adjuvant chemotherapy due to high risk of recurrence.  She voiced good understanding and agrees with the plan. -I will see her after surgery.  Plan -we reviewed her CT, MRI images and discussed the findings  -she will proceed with surgery on 3/25 -will obtain Oncotype on surgical sample if <=3 positive nodes -will see her after surgery or radiation, depends on if she needs chemo    SUMMARY OF ONCOLOGIC HISTORY: Oncology History Overview Note   Cancer Staging  Malignant neoplasm of overlapping sites of left breast in female, estrogen receptor positive ([place]) Staging form: Breast, AJCC 8th Edition - Clinical stage from 04/08/2022: Stage Unknown (cTX, cN0, cM0, G1, ER+, PR+, HER2-) - Signed by Truitt Merle, MD on 04/16/2022 Stage prefix: Initial diagnosis Histologic grading system: 3 grade system     Malignant neoplasm of overlapping sites of left breast in female, estrogen receptor positive ([place])  03/20/2022 Imaging    IMPRESSION: 1. Suspicious architectural distortion within the central/retroareolar LEFT breast, best seen on spot compression CC slice 29, without sonographic correlate. Stereotactic biopsy is recommended. 2. Additional possible subtle architectural distortion within the outer LEFT breast, best seen on spot compression CC slice 46, without sonographic correlate. Stereotactic biopsy is recommended.     04/08/2022 Procedure    FINDINGS: 3D Mammographic images were obtained following stereotactic core biopsies of left breast distortion. There is a ribbon and coil shaped clip in the upper aspect of the breast in appropriate position.  04/16/2022 Initial Diagnosis   Malignant neoplasm of overlapping sites of left breast in female, estrogen receptor positive ([place])     INTERVAL HISTORY: Pt is  scheduled for a phone visit to discuss her recent imaging findings.  She is doing well, no new complaints.  She is scheduled for left mastectomy and targeted lymph node dissection on June 24, 2022.  I have verified her identity by 2 masses.  Her husband was on the phone with her today.   REVIEW OF SYSTEMS:   Constitutional: Denies fevers, chills or abnormal weight loss Eyes: Denies blurriness of vision Ears, nose, mouth, throat, and face: Denies mucositis or sore throat Respiratory: Denies cough, dyspnea or wheezes Cardiovascular: Denies palpitation, chest discomfort or lower extremity swelling Gastrointestinal:  Denies nausea, heartburn or change in bowel habits Skin: Denies abnormal skin rashes Lymphatics: Denies new lymphadenopathy or easy bruising Neurological:Denies numbness, tingling or new weaknesses Behavioral/Psych: Mood is stable, no new changes  All other systems were reviewed with the patient and are negative.  MEDICAL HISTORY:  Past Medical History:  Diagnosis Date   Atypical nevus 04/12/2014   mid back -mild   Hypertension    Thyroid disease    Hypothyridism    SURGICAL HISTORY: Past Surgical History:  Procedure Laterality Date   BREAST BIOPSY Left 04/08/2022   MM LT BREAST BX W LOC DEV EA AD LESION IMG BX SPEC STEREO GUIDE 04/08/2022 [facility]   BREAST BIOPSY Left 04/08/2022   MM LT BREAST BX W LOC DEV 1ST LESION IMAGE BX SPEC STEREO GUIDE 04/08/2022 [facility]   CATARACT EXTRACTION Right 2015   OOPHORECTOMY  1980    I have reviewed the social history and family history with the patient and they are unchanged from previous note.  ALLERGIES:  is allergic to azithromycin.  MEDICATIONS:  Current Outpatient Medications  Medication Sig Dispense Refill   blood glucose meter kit and supplies KIT Dispense based on patient and insurance preference. Use up to four times daily as directed. (FOR ICD-9 250.00, 250.01). 1 each 0   carvedilol (COREG) 6.25 MG  tablet Take 1 tablet (6.25 mg total) by mouth 2 (two) times daily with a meal. 180 tablet 1   fenofibrate 54 MG tablet TAKE 1 TABLET BY MOUTH EVERY DAY 90 tablet 1   glucose blood test strip Onetouch verio test strips. Check blood sugar once a day 100 each 0   levothyroxine (SYNTHROID) 112 MCG tablet TAKE 1 TABLET BY MOUTH DAILY BEFORE BREAKFAST. 90 tablet 1   metFORMIN (GLUCOPHAGE) 500 MG tablet TAKE 1 TABLET BY MOUTH TWICE A DAY WITH FOOD 180 tablet 1   Multiple Vitamin (MULTIVITAMIN ADULT) TABS Take by mouth.     Omega-3 Fatty Acids (FISH OIL) 1200 MG CAPS Take 1 capsule by mouth daily.     ONETOUCH DELICA LANCETS FINE MISC 1 Device by Does not apply route daily. 100 each 0   PROTEIN PO Take by mouth.     rosuvastatin (CRESTOR) 10 MG tablet Take 1 tablet (10 mg total) by mouth daily. 90 tablet 0   Semaglutide,0.25 or 0.'5MG'$ /DOS, 2 MG/3ML SOPN Inject 0.25 mg into the skin once a week. Increase to 0.'5mg'$  in one month. 3 mL 1   triamcinolone cream (KENALOG) 0.1 % APPLY TO AFFECTED AREA EVERY DAY  4   VITAMIN C, CALCIUM ASCORBATE, PO Take by mouth.     VITAMIN D, CHOLECALCIFEROL, PO Take by mouth.     vitamin E 400 UNIT capsule Take 400  Units by mouth daily.     No current facility-administered medications for this visit.    PHYSICAL EXAMINATION: Not performed   LABORATORY DATA:  I have reviewed the data as listed    Latest Ref Rng & Units 04/17/2022   12:23 PM 02/28/2022   11:34 AM 07/30/2021   10:10 AM  CBC  WBC 4.0 - 10.5 K/uL 8.1  6.7  5.6   Hemoglobin 12.0 - 15.0 g/dL 13.7  13.8  13.6   Hematocrit 36.0 - 46.0 % 40.2  41.1  40.6   Platelets 150 - 400 K/uL 221  210.0  185.0         Latest Ref Rng & Units 06/05/2022    9:56 AM 04/17/2022   12:23 PM 02/28/2022   11:34 AM  CMP  Glucose 70 - 99 mg/dL  119  116   BUN 8 - 23 mg/dL  10  12   Creatinine 0.44 - 1.00 mg/dL 0.70  0.70  0.68   Sodium 135 - 145 mmol/L  139  140   Potassium 3.5 - 5.1 mmol/L  4.0  4.2   Chloride 98 - 111  mmol/L  103  103   CO2 22 - 32 mmol/L  30  30   Calcium 8.9 - 10.3 mg/dL  10.0  9.4   Total Protein 6.5 - 8.1 g/dL  7.3  7.2   Total Bilirubin 0.3 - 1.2 mg/dL  0.5  0.4   Alkaline Phos 38 - 126 U/L  62  57   AST 15 - 41 U/L  17  16   ALT 0 - 44 U/L  16  16       RADIOGRAPHIC STUDIES: I have personally reviewed the radiological images as listed and agreed with the findings in the report. CT CHEST ABDOMEN PELVIS W CONTRAST  Result Date: 06/05/2022 CLINICAL DATA:  67 year old female with history of invasive left-sided breast cancer. * Tracking Code: BO * EXAM: CT CHEST, ABDOMEN, AND PELVIS WITH CONTRAST TECHNIQUE: Multidetector CT imaging of the chest, abdomen and pelvis was performed following the standard protocol during bolus administration of intravenous contrast. RADIATION DOSE REDUCTION: This exam was performed according to the departmental dose-optimization program which includes automated exposure control, adjustment of the mA and/or kV according to patient size and/or use of iterative reconstruction technique. CONTRAST:  147m OMNIPAQUE IOHEXOL 300 MG/ML  SOLN COMPARISON:  No priors. FINDINGS: CT CHEST FINDINGS Cardiovascular: Heart size is normal. There is no significant pericardial thickening or pericardial calcification. Aortic atherosclerosis. No definite coronary artery calcifications. Mediastinum/Nodes: In the low right paratracheal region (axial image 17 of series 2) there is a 1.9 x 1.9 cm low-attenuation lesion which may represent an enlarged right paratracheal lymph node, although this appears contiguous with a superior pericardial recess, favored to represent a very small pericardial cyst. No other pathologically enlarged mediastinal, hilar or internal mammary lymph nodes are noted. Esophagus is unremarkable in appearance. No axillary or subpectoral lymphadenopathy. Lungs/Pleura: 5 mm left lower lobe pulmonary nodule (axial image 101 of series 6). No other larger more suspicious  appearing pulmonary nodules or masses are noted. No acute consolidative airspace disease. No pleural effusions. Musculoskeletal: Poorly defined heterogeneously enhancing area of soft tissue prominence in the left breast estimated to measure approximately 3.4 x 2.5 cm (axial image 23 of series 2), likely the reported breast neoplasm. There are no aggressive appearing lytic or blastic lesions noted in the visualized portions of the skeleton. CT ABDOMEN PELVIS FINDINGS Hepatobiliary: No  suspicious cystic or solid hepatic lesions. No intra or extrahepatic biliary ductal dilatation. Gallbladder is unremarkable in appearance. Pancreas: No pancreatic mass. No pancreatic ductal dilatation. No pancreatic or peripancreatic fluid collections or inflammatory changes. Spleen: Unremarkable. Adrenals/Urinary Tract: Bilateral kidneys and adrenal glands are normal in appearance. No hydroureteronephrosis. Urinary bladder is normal in appearance. Stomach/Bowel: The appearance of the stomach is normal. There is no pathologic dilatation of small bowel or colon. Numerous colonic diverticuli are noted, without surrounding inflammatory changes to indicate an acute diverticulitis at this time. Normal appendix. Vascular/Lymphatic: No significant atherosclerotic disease, aneurysm or dissection noted in the abdominal or pelvic vasculature. No lymphadenopathy noted in the abdomen or pelvis. Reproductive: Uterus and ovaries are unremarkable in appearance. Other: No significant volume of ascites.  No pneumoperitoneum. Musculoskeletal: There are no aggressive appearing lytic or blastic lesions noted in the visualized portions of the skeleton. IMPRESSION: 1. Poorly defined heterogeneously enhancing mass-like area in the left breast, presumably corresponding to the reported left-sided breast cancer. No definitive evidence to suggest metastatic disease in the chest, abdomen or pelvis. 2. There is a 1.5 cm structure in the low right paratracheal  region. This is low-attenuation, and appears to communicate with a superior pericardial recess, presumably a small pericardial cyst. Less likely, low right paratracheal lymphadenopathy could have this appearance. If this is an enlarged lymph node, this would be a highly unusual pattern of dissemination for a primary left-sided breast cancer. Attention at time of follow-up imaging is recommended to ensure stability. 3. 5 mm left lower lobe pulmonary nodule. This is nonspecific, but statistically likely benign. Given the patient's history of primary left-sided breast cancer, attention on follow-up studies is recommended to ensure stability or regression. 4. Colonic diverticulosis without evidence of acute diverticulitis at this time. 5. Aortic atherosclerosis (mild). Electronically Signed   By: Vinnie Langton M.D.   On: 06/05/2022 12:44       I discussed the assessment and treatment plan with the patient. The patient was provided an opportunity to ask questions and all were answered. The patient agreed with the plan and demonstrated an understanding of the instructions.   The patient was advised to call back or seek an in-person evaluation if the symptoms worsen or if the condition fails to improve as anticipated.  I provided 25 minutes of non face-to-face telephone visit time during this encounter, and > 50% was spent counseling as documented under my assessment & plan.     Truitt Merle, MD 06/07/22

## 2022-06-10 ENCOUNTER — Encounter: Payer: Self-pay | Admitting: Student

## 2022-06-10 ENCOUNTER — Ambulatory Visit (INDEPENDENT_AMBULATORY_CARE_PROVIDER_SITE_OTHER): Payer: Medicare Other | Admitting: Student

## 2022-06-10 VITALS — BP 131/80 | HR 91 | Ht 66.0 in | Wt 202.8 lb

## 2022-06-10 DIAGNOSIS — Z170 Estrogen receptor positive status [ER+]: Secondary | ICD-10-CM

## 2022-06-10 DIAGNOSIS — C50812 Malignant neoplasm of overlapping sites of left female breast: Secondary | ICD-10-CM

## 2022-06-10 MED ORDER — TRAMADOL HCL 50 MG PO TABS: 50.0000 mg | ORAL_TABLET | Freq: Three times a day (TID) | ORAL | 0 refills | Status: DC | PRN

## 2022-06-10 MED ORDER — ONDANSETRON HCL 4 MG PO TABS: 4.0000 mg | ORAL_TABLET | Freq: Three times a day (TID) | ORAL | 0 refills | Status: DC | PRN

## 2022-06-10 MED ORDER — CEPHALEXIN 500 MG PO CAPS: 500.0000 mg | ORAL_CAPSULE | Freq: Four times a day (QID) | ORAL | 0 refills | Status: AC

## 2022-06-10 MED ORDER — DIAZEPAM 2 MG PO TABS: 2.0000 mg | ORAL_TABLET | Freq: Two times a day (BID) | ORAL | 0 refills | Status: DC | PRN

## 2022-06-10 NOTE — H&P (View-Only) (Signed)
   Patient ID: Diane Hodges, female    DOB: 12/09/1955, 66 y.o.   MRN: [id number]  Chief Complaint  Patient presents with   Pre-op Exam      ICD-10-CM   1. Malignant neoplasm of overlapping sites of left breast in female, estrogen receptor positive (HCC)  C50.812    Z17.0        History of Present Illness: Diane Hodges is a 66 y.o.  female  with a history of breast cancer.  She presents for preoperative evaluation for upcoming procedure, immediate left breast reconstruction with placement of tissue expander and flex HD, scheduled for 06/24/22 with Dr.  Dillingham.  Patient will also be undergoing left mastectomy and sentinel node biopsy with Dr. Toth at the same time.  Patient reports that she has had anesthesia before.  She states that she has had 1 experience of postoperative nausea and vomiting.  Patient states that she had anesthesia for separate surgery though and did okay with it.  Patient denies any cardiac diseases.  She denies taking any blood thinners.  Patient reports she is not a smoker.  Patient denies taking any birth control or hormone replacement.  She denies any history of miscarriages.  She denies any personal or family history of blood clots or clotting diseases.  She denies any recent surgeries, traumas, infections, or hospitalizations.  She denies any history of stroke or heart attack.  Patient denies having a Port-A-Cath in at this time.  Patient denies history of Crohn's disease or ulcerative colitis.  She denies any history of COPD or asthma.  She denies any varicosities on her lower extremities.  Patient denies any recent fevers or chills.  Patient reports she is currently a B cup.  She states that currently, her right breast is bigger than her left breast.  She states that she would like to be approximately the same size after surgery.  I discussed the limitations of how much fluid can be put into the expander affecting the final size.  Patient expressed  understanding.  Summary of Previous Visit: Patient was seen for consult by Dr. Dillingham on 05/08/2022.  At this visit, patient reported that she had a mammogram in December with abnormalities to the left breast.  Patient underwent a biopsy which showed grade 1 invasive lobular carcinoma that was estrogen and progesterone positive.  Patient reported her preoperative bra size was a B/C cup.  Patient stated that she would like to be around the same size.  Patient at this visit reported she had an MRI scheduled and she would wait to find out the results before making her final decision between a lumpectomy of the left breast versus a mastectomy of the left breast.  Plan was for patient to do a virtual visit the following week once the results of the MRI came back.  Patient later had a telephone visit on 05/31/2022.  Patient's disease was found in the central portion of the breast and a mastectomy was advised.  Job: Retired  PMH Significant for: Hypertension, type 2 diabetes, hypothyroidism  Patient's most recent A1c was 6.8 in November.  Patient states she no longer takes semaglutide.  Chemotherapy/radiation: Patient states that she is unsure at this time as she will need chemotherapy, but patient states that she will need 6 weeks of radiation.  Patient states she does not know when this will be given.  I did discuss with the patient that radiation can affect when her tissue expander to implant   exchange surgery can occur as she needs to recover from radiation.  Patient expressed understanding.  Patient reports that she has had a mild reaction to adhesives after surgery after they have been left on for a few days.  She does not know any specific adhesive that caused this.  She is unsure if she had Dermabond on and if it contributed to her reaction.   Past Medical History: Allergies: Allergies  Allergen Reactions   Azithromycin     REACTION: rash    Current Medications:  Current Outpatient  Medications:    blood glucose meter kit and supplies KIT, Dispense based on patient and insurance preference. Use up to four times daily as directed. (FOR ICD-9 250.00, 250.01)., Disp: 1 each, Rfl: 0   carvedilol (COREG) 6.25 MG tablet, Take 1 tablet (6.25 mg total) by mouth 2 (two) times daily with a meal., Disp: 180 tablet, Rfl: 1   cephALEXin (KEFLEX) 500 MG capsule, Take 1 capsule (500 mg total) by mouth 4 (four) times daily for 3 days., Disp: 12 capsule, Rfl: 0   diazepam (VALIUM) 2 MG tablet, Take 1 tablet (2 mg total) by mouth every 12 (twelve) hours as needed for up to 20 doses for muscle spasms., Disp: 20 tablet, Rfl: 0   fenofibrate 54 MG tablet, TAKE 1 TABLET BY MOUTH EVERY DAY, Disp: 90 tablet, Rfl: 1   glucose blood test strip, Onetouch verio test strips. Check blood sugar once a day, Disp: 100 each, Rfl: 0   levothyroxine (SYNTHROID) 112 MCG tablet, TAKE 1 TABLET BY MOUTH DAILY BEFORE BREAKFAST., Disp: 90 tablet, Rfl: 1   metFORMIN (GLUCOPHAGE) 500 MG tablet, TAKE 1 TABLET BY MOUTH TWICE A DAY WITH FOOD, Disp: 180 tablet, Rfl: 1   Multiple Vitamin (MULTIVITAMIN ADULT) TABS, Take by mouth., Disp: , Rfl:    Omega-3 Fatty Acids (FISH OIL) 1200 MG CAPS, Take 1 capsule by mouth daily., Disp: , Rfl:    ondansetron (ZOFRAN) 4 MG tablet, Take 1 tablet (4 mg total) by mouth every 8 (eight) hours as needed for up to 20 doses for nausea or vomiting., Disp: 20 tablet, Rfl: 0   ONETOUCH DELICA LANCETS FINE MISC, 1 Device by Does not apply route daily., Disp: 100 each, Rfl: 0   PROTEIN PO, Take by mouth., Disp: , Rfl:    rosuvastatin (CRESTOR) 10 MG tablet, Take 1 tablet (10 mg total) by mouth daily., Disp: 90 tablet, Rfl: 0   Semaglutide,0.25 or 0.5MG/DOS, 2 MG/3ML SOPN, Inject 0.25 mg into the skin once a week. Increase to 0.5mg in one month., Disp: 3 mL, Rfl: 1   traMADol (ULTRAM) 50 MG tablet, Take 1 tablet (50 mg total) by mouth every 8 (eight) hours as needed for up to 20 doses for moderate  pain or severe pain., Disp: 20 tablet, Rfl: 0   triamcinolone cream (KENALOG) 0.1 %, APPLY TO AFFECTED AREA EVERY DAY, Disp: , Rfl: 4   VITAMIN C, CALCIUM ASCORBATE, PO, Take by mouth., Disp: , Rfl:    VITAMIN D, CHOLECALCIFEROL, PO, Take by mouth., Disp: , Rfl:    vitamin E 400 UNIT capsule, Take 400 Units by mouth daily., Disp: , Rfl:   Past Medical Problems: Past Medical History:  Diagnosis Date   Atypical nevus 04/12/2014   mid back -mild   Hypertension    Thyroid disease    Hypothyridism    Past Surgical History: Past Surgical History:  Procedure Laterality Date   BREAST BIOPSY Left 04/08/2022   MM   LT BREAST BX W LOC DEV EA AD LESION IMG BX SPEC STEREO GUIDE 04/08/2022 [facility]   BREAST BIOPSY Left 04/08/2022   MM LT BREAST BX W LOC DEV 1ST LESION IMAGE BX SPEC STEREO GUIDE 04/08/2022 [facility]   CATARACT EXTRACTION Right 2015   OOPHORECTOMY  1980    Social History: Social History   Socioeconomic History   Marital status: Married    Spouse name: Not on file   Number of children: 2   Years of education: Not on file   Highest education level: Not on file  Occupational History   Occupation: credit reporting    Employer: [employer]  Tobacco Use   Smoking status: Never   Smokeless tobacco: Never  Substance and Sexual Activity   Alcohol use: No   Drug use: No   Sexual activity: Yes    Partners: Male  Other Topics Concern   Not on file  Social History Narrative   Exercise-- none   Social Determinants of Health   Financial Resource Strain: Low Risk  (02/08/2021)   Overall Financial Resource Strain (CARDIA)    Difficulty of Paying Living Expenses: Not hard at all  Food Insecurity: No Food Insecurity (02/13/2022)   Hunger Vital Sign    Worried About Running Out of Food in the Last Year: Never true    Ran Out of Food in the Last Year: Never true  Transportation Needs: No Transportation Needs (02/13/2022)   PRAPARE - Transportation    Lack of  Transportation (Medical): No    Lack of Transportation (Non-Medical): No  Physical Activity: Inactive (02/08/2021)   Exercise Vital Sign    Days of Exercise per Week: 0 days    Minutes of Exercise per Session: 0 min  Stress: No Stress Concern Present (02/08/2021)   [employer] - Occupational Stress Questionnaire    Feeling of Stress : Not at all  Social Connections: Socially Integrated (02/08/2021)   Social Connection and Isolation Panel [NHANES]    Frequency of Communication with Friends and Family: More than three times a week    Frequency of Social Gatherings with Friends and Family: More than three times a week    Attends Religious Services: More than 4 times per year    Active Member of Clubs or Organizations: Yes    Attends Club or Organization Meetings: More than 4 times per year    Marital Status: Married  Intimate Partner Violence: Not At Risk (02/13/2022)   Humiliation, Afraid, Rape, and Kick questionnaire    Fear of Current or Ex-Partner: No    Emotionally Abused: No    Physically Abused: No    Sexually Abused: No    Family History: Family History  Problem Relation Age of Onset   Cancer Mother        breast cancer or precancer   Hypertension Mother    Hypertension Father    Melanoma Father 88   Thyroid disease Other     Review of Systems: Patient denies any fevers, chills  Physical Exam: Vital Signs BP 131/80 (BP Location: Left Arm, Patient Position: Sitting, Cuff Size: Large)   Pulse 91   Ht 5' 6" (1.676 m)   Wt 202 lb 12.8 oz (92 kg)   SpO2 95%   BMI 32.73 kg/m   Physical Exam  Constitutional:      General: Not in acute distress.    Appearance: Normal appearance. Not ill-appearing.  HENT:     Head: Normocephalic   and atraumatic.  Neck:     Musculoskeletal: Normal range of motion.  Cardiovascular:     Rate and Rhythm: Normal rate Pulmonary:     Effort: Pulmonary effort is normal. No respiratory distress.   Musculoskeletal: Normal range of motion.  Skin:    General: Skin is warm and dry.     Findings: No erythema or rash.  Neurological:     Mental Status: Alert and oriented to person, place, and time. Mental status is at baseline.  Psychiatric:        Mood and Affect: Mood normal.        Behavior: Behavior normal.    Assessment/Plan: The patient is scheduled for immediate left breast reconstruction with placement of tissue expander and Flex HD with Dr. Dillingham.  Risks, benefits, and alternatives of procedure discussed, questions answered and consent obtained.    Smoking Status: Non-smoker; Counseling Given?  N/A Last Mammogram: 03/20/2022; Results: BI-RADS Category 4 suspicious  Caprini Score: 7; Risk Factors include: Age, BMI > 25, history of malignancy, and length of planned surgery. Recommendation for mechanical and possible pharmacological prophylaxis. Encourage early ambulation.  I will discuss with Dr. Dillingham the possibility of postoperative Lovenox.  Pictures obtained: @consult  Post-op Rx sent to pharmacy: Tramadol, Zofran, Keflex, Valium  I instructed the patient to hold her fenofibrate 24 hours prior to surgery.  I discussed with her to hold her metformin the day of surgery.  I discussed with her that she should hold any multivitamins, vitamins or supplements 1 week prior to surgery.  Patient expressed understanding.  Patient was provided with the breast reconstruction and General Surgical Risk consent document and Pain Medication Agreement prior to their appointment.  They had adequate time to read through the risk consent documents and Pain Medication Agreement. We also discussed them in person together during this preop appointment. All of their questions were answered to their satisfaction.  Recommended calling if they have any further questions.  Risk consent form and Pain Medication Agreement to be scanned into patient's chart.  The risks that can be encountered with  and after placement of a breast expander placement were discussed and include the following but not limited to these: bleeding, infection, delayed healing, anesthesia risks, skin sensation changes, injury to structures including nerves, blood vessels, and muscles which may be temporary or permanent, allergies to tape, suture materials and glues, blood products, topical preparations or injected agents, skin contour irregularities, skin discoloration and swelling, deep vein thrombosis, cardiac and pulmonary complications, pain, which may persist, fluid accumulation, wrinkling of the skin over the expander, changes in nipple or breast sensation, expander leakage or rupture, faulty position of the expander, persistent pain, formation of tight scar tissue around the expander (capsular contracture), possible need for revisional surgery or staged procedures.  I discussed with the patient that her history of diabetes may put her at a higher risk of wound healing complications.  I discussed with the patient that she should focus on a diet that is high in protein, low in carbs and high in nutrient dense foods such as vegetables and lean meats.  Patient expressed understanding.   Electronically signed by: Macarena Langseth E Donie Moulton, PA-C 06/10/2022 4:28 PM  

## 2022-06-10 NOTE — Progress Notes (Signed)
Patient ID: Diane Hodges, female    DOB: 07-10-1955, 67 y.o.   MRN: [id number]  Chief Complaint  Patient presents with   Pre-op Exam      ICD-10-CM   1. Malignant neoplasm of overlapping sites of left breast in female, estrogen receptor positive ([place])  C50.812    Z17.0        History of Present Illness: Diane Hodges is a 67 y.o.  female  with a history of breast cancer.  She presents for preoperative evaluation for upcoming procedure, immediate left breast reconstruction with placement of tissue expander and flex HD, scheduled for 06/24/22 with Dr.  Marla Roe.  Patient will also be undergoing left mastectomy and sentinel node biopsy with Dr. Marlou Starks at the same time.  Patient reports that she has had anesthesia before.  She states that she has had 1 experience of postoperative nausea and vomiting.  Patient states that she had anesthesia for separate surgery though and did okay with it.  Patient denies any cardiac diseases.  She denies taking any blood thinners.  Patient reports she is not a smoker.  Patient denies taking any birth control or hormone replacement.  She denies any history of miscarriages.  She denies any personal or family history of blood clots or clotting diseases.  She denies any recent surgeries, traumas, infections, or hospitalizations.  She denies any history of stroke or heart attack.  Patient denies having a Port-A-Cath in at this time.  Patient denies history of Crohn's disease or ulcerative colitis.  She denies any history of COPD or asthma.  She denies any varicosities on her lower extremities.  Patient denies any recent fevers or chills.  Patient reports she is currently a B cup.  She states that currently, her right breast is bigger than her left breast.  She states that she would like to be approximately the same size after surgery.  I discussed the limitations of how much fluid can be put into the expander affecting the final size.  Patient expressed  understanding.  Summary of Previous Visit: Patient was seen for consult by Dr. Marla Roe on 05/08/2022.  At this visit, patient reported that she had a mammogram in December with abnormalities to the left breast.  Patient underwent a biopsy which showed grade 1 invasive lobular carcinoma that was estrogen and progesterone positive.  Patient reported her preoperative bra size was a B/C cup.  Patient stated that she would like to be around the same size.  Patient at this visit reported she had an MRI scheduled and she would wait to find out the results before making her final decision between a lumpectomy of the left breast versus a mastectomy of the left breast.  Plan was for patient to do a virtual visit the following week once the results of the MRI came back.  Patient later had a telephone visit on 05/31/2022.  Patient's disease was found in the central portion of the breast and a mastectomy was advised.  Job: Retired  [place] Significant for: Hypertension, type 2 diabetes, hypothyroidism  Patient's most recent A1c was 6.8 in November.  Patient states she no longer takes semaglutide.  Chemotherapy/radiation: Patient states that she is unsure at this time as she will need chemotherapy, but patient states that she will need 6 weeks of radiation.  Patient states she does not know when this will be given.  I did discuss with the patient that radiation can affect when her tissue expander to implant  exchange surgery can occur as she needs to recover from radiation.  Patient expressed understanding.  Patient reports that she has had a mild reaction to adhesives after surgery after they have been left on for a few days.  She does not know any specific adhesive that caused this.  She is unsure if she had Dermabond on and if it contributed to her reaction.   Past Medical History: Allergies: Allergies  Allergen Reactions   Azithromycin     REACTION: rash    Current Medications:  Current Outpatient  Medications:    blood glucose meter kit and supplies KIT, Dispense based on patient and insurance preference. Use up to four times daily as directed. (FOR ICD-9 250.00, 250.01)., Disp: 1 each, Rfl: 0   carvedilol (COREG) 6.25 MG tablet, Take 1 tablet (6.25 mg total) by mouth 2 (two) times daily with a meal., Disp: 180 tablet, Rfl: 1   cephALEXin (KEFLEX) 500 MG capsule, Take 1 capsule (500 mg total) by mouth 4 (four) times daily for 3 days., Disp: 12 capsule, Rfl: 0   diazepam (VALIUM) 2 MG tablet, Take 1 tablet (2 mg total) by mouth every 12 (twelve) hours as needed for up to 20 doses for muscle spasms., Disp: 20 tablet, Rfl: 0   fenofibrate 54 MG tablet, TAKE 1 TABLET BY MOUTH EVERY DAY, Disp: 90 tablet, Rfl: 1   glucose blood test strip, Onetouch verio test strips. Check blood sugar once a day, Disp: 100 each, Rfl: 0   levothyroxine (SYNTHROID) 112 MCG tablet, TAKE 1 TABLET BY MOUTH DAILY BEFORE BREAKFAST., Disp: 90 tablet, Rfl: 1   metFORMIN (GLUCOPHAGE) 500 MG tablet, TAKE 1 TABLET BY MOUTH TWICE A DAY WITH FOOD, Disp: 180 tablet, Rfl: 1   Multiple Vitamin (MULTIVITAMIN ADULT) TABS, Take by mouth., Disp: , Rfl:    Omega-3 Fatty Acids (FISH OIL) 1200 MG CAPS, Take 1 capsule by mouth daily., Disp: , Rfl:    ondansetron (ZOFRAN) 4 MG tablet, Take 1 tablet (4 mg total) by mouth every 8 (eight) hours as needed for up to 20 doses for nausea or vomiting., Disp: 20 tablet, Rfl: 0   ONETOUCH DELICA LANCETS FINE MISC, 1 Device by Does not apply route daily., Disp: 100 each, Rfl: 0   PROTEIN PO, Take by mouth., Disp: , Rfl:    rosuvastatin (CRESTOR) 10 MG tablet, Take 1 tablet (10 mg total) by mouth daily., Disp: 90 tablet, Rfl: 0   Semaglutide,0.25 or 0.'5MG'$ /DOS, 2 MG/3ML SOPN, Inject 0.25 mg into the skin once a week. Increase to 0.'5mg'$  in one month., Disp: 3 mL, Rfl: 1   traMADol (ULTRAM) 50 MG tablet, Take 1 tablet (50 mg total) by mouth every 8 (eight) hours as needed for up to 20 doses for moderate  pain or severe pain., Disp: 20 tablet, Rfl: 0   triamcinolone cream (KENALOG) 0.1 %, APPLY TO AFFECTED AREA EVERY DAY, Disp: , Rfl: 4   VITAMIN C, CALCIUM ASCORBATE, PO, Take by mouth., Disp: , Rfl:    VITAMIN D, CHOLECALCIFEROL, PO, Take by mouth., Disp: , Rfl:    vitamin E 400 UNIT capsule, Take 400 Units by mouth daily., Disp: , Rfl:   Past Medical Problems: Past Medical History:  Diagnosis Date   Atypical nevus 04/12/2014   mid back -mild   Hypertension    Thyroid disease    Hypothyridism    Past Surgical History: Past Surgical History:  Procedure Laterality Date   BREAST BIOPSY Left 04/08/2022   MM  LT BREAST BX W LOC DEV EA AD LESION IMG BX SPEC STEREO GUIDE 04/08/2022 [facility]   BREAST BIOPSY Left 04/08/2022   MM LT BREAST BX W LOC DEV 1ST LESION IMAGE BX SPEC STEREO GUIDE 04/08/2022 [facility]   CATARACT EXTRACTION Right 2015   OOPHORECTOMY  1980    Social History: Social History   Socioeconomic History   Marital status: Married    Spouse name: Not on file   Number of children: 2   Years of education: Not on file   Highest education level: Not on file  Occupational History   Occupation: credit reporting    Employer: [employer]  Tobacco Use   Smoking status: Never   Smokeless tobacco: Never  Substance and Sexual Activity   Alcohol use: No   Drug use: No   Sexual activity: Yes    Partners: Male  Other Topics Concern   Not on file  Social History Narrative   Exercise-- none   Social Determinants of Health   Financial Resource Strain: Low Risk  (02/08/2021)   Overall Financial Resource Strain (CARDIA)    Difficulty of Paying Living Expenses: Not hard at all  Food Insecurity: No Food Insecurity (02/13/2022)   Hunger Vital Sign    Worried About Running Out of Food in the Last Year: Never true    Ran Out of Food in the Last Year: Never true  Transportation Needs: No Transportation Needs (02/13/2022)   PRAPARE - Radiographer, therapeutic (Medical): No    Lack of Transportation (Non-Medical): No  Physical Activity: Inactive (02/08/2021)   Exercise Vital Sign    Days of Exercise per Week: 0 days    Minutes of Exercise per Session: 0 min  Stress: No Stress Concern Present (02/08/2021)   [place]    Feeling of Stress : Not at all  Social Connections: [place] (02/08/2021)   Social Connection and Isolation Panel [NHANES]    Frequency of Communication with Friends and Family: More than three times a week    Frequency of Social Gatherings with Friends and Family: More than three times a week    Attends Religious Services: More than 4 times per year    Active Member of [employer] or Organizations: Yes    Attends Music therapist: More than 4 times per year    Marital Status: Married  Human resources officer Violence: Not At Risk (02/13/2022)   Humiliation, Afraid, Rape, and Kick questionnaire    Fear of Current or Ex-Partner: No    Emotionally Abused: No    Physically Abused: No    Sexually Abused: No    Family History: Family History  Problem Relation Age of Onset   Cancer Mother        breast cancer or precancer   Hypertension Mother    Hypertension Father    Melanoma Father 68   Thyroid disease Other     Review of Systems: Patient denies any fevers, chills  Physical Exam: Vital Signs BP 131/80 (BP Location: Left Arm, Patient Position: Sitting, Cuff Size: Large)   Pulse 91   Ht '5\' 6"'$  (1.676 m)   Wt 202 lb 12.8 oz (92 kg)   SpO2 95%   BMI 32.73 kg/m   Physical Exam  Constitutional:      General: Not in acute distress.    Appearance: Normal appearance. Not ill-appearing.  HENT:     Head: Normocephalic  and atraumatic.  Neck:     Musculoskeletal: Normal range of motion.  Cardiovascular:     Rate and Rhythm: Normal rate Pulmonary:     Effort: Pulmonary effort is normal. No respiratory distress.   Musculoskeletal: Normal range of motion.  Skin:    General: Skin is warm and dry.     Findings: No erythema or rash.  Neurological:     Mental Status: Alert and oriented to person, place, and time. Mental status is at baseline.  Psychiatric:        Mood and Affect: Mood normal.        Behavior: Behavior normal.    Assessment/Plan: The patient is scheduled for immediate left breast reconstruction with placement of tissue expander and Flex HD with Dr. Marla Roe.  Risks, benefits, and alternatives of procedure discussed, questions answered and consent obtained.    Smoking Status: Non-smoker; Counseling Given?  N/A Last Mammogram: 03/20/2022; Results: BI-RADS Category 4 suspicious  Caprini Score: 7; Risk Factors include: Age, BMI > 25, history of malignancy, and length of planned surgery. Recommendation for mechanical and possible pharmacological prophylaxis. Encourage early ambulation.  I will discuss with Dr. Marla Roe the possibility of postoperative Lovenox.  Pictures obtained: '@consult'$   Post-op Rx sent to pharmacy: Tramadol, Zofran, Keflex, Valium  I instructed the patient to hold her fenofibrate 24 hours prior to surgery.  I discussed with her to hold her metformin the day of surgery.  I discussed with her that she should hold any multivitamins, vitamins or supplements 1 week prior to surgery.  Patient expressed understanding.  Patient was provided with the breast reconstruction and General Surgical Risk consent document and Pain Medication Agreement prior to their appointment.  They had adequate time to read through the risk consent documents and Pain Medication Agreement. We also discussed them in person together during this preop appointment. All of their questions were answered to their satisfaction.  Recommended calling if they have any further questions.  Risk consent form and Pain Medication Agreement to be scanned into patient's chart.  The risks that can be encountered with  and after placement of a breast expander placement were discussed and include the following but not limited to these: bleeding, infection, delayed healing, anesthesia risks, skin sensation changes, injury to structures including nerves, blood vessels, and muscles which may be temporary or permanent, allergies to tape, suture materials and glues, blood products, topical preparations or injected agents, skin contour irregularities, skin discoloration and swelling, deep vein thrombosis, cardiac and pulmonary complications, pain, which may persist, fluid accumulation, wrinkling of the skin over the expander, changes in nipple or breast sensation, expander leakage or rupture, faulty position of the expander, persistent pain, formation of tight scar tissue around the expander (capsular contracture), possible need for revisional surgery or staged procedures.  I discussed with the patient that her history of diabetes may put her at a higher risk of wound healing complications.  I discussed with the patient that she should focus on a diet that is high in protein, low in carbs and high in nutrient dense foods such as vegetables and lean meats.  Patient expressed understanding.   Electronically signed by: Clance Boll, PA-C 06/10/2022 4:28 PM

## 2022-06-14 ENCOUNTER — Encounter: Payer: Self-pay | Admitting: *Deleted

## 2022-06-17 ENCOUNTER — Other Ambulatory Visit: Payer: Self-pay

## 2022-06-17 ENCOUNTER — Encounter (HOSPITAL_BASED_OUTPATIENT_CLINIC_OR_DEPARTMENT_OTHER): Payer: Self-pay | Admitting: General Surgery

## 2022-06-19 ENCOUNTER — Encounter (HOSPITAL_BASED_OUTPATIENT_CLINIC_OR_DEPARTMENT_OTHER)
Admission: RE | Admit: 2022-06-19 | Discharge: 2022-06-19 | Disposition: A | Discharge status: Home / Self Care | Payer: Medicare Other | Source: Ambulatory Visit | Attending: General Surgery | Admitting: General Surgery

## 2022-06-19 DIAGNOSIS — Z01812 Encounter for preprocedural laboratory examination: Secondary | ICD-10-CM | POA: Diagnosis not present

## 2022-06-19 LAB — BASIC METABOLIC PANEL
Anion gap: 9 (ref 5–15)
BUN: 7 mg/dL — ABNORMAL LOW (ref 8–23)
CO2: 26 mmol/L (ref 22–32)
Calcium: 9 mg/dL (ref 8.9–10.3)
Chloride: 103 mmol/L (ref 98–111)
Creatinine, Ser: 0.78 mg/dL (ref 0.44–1.00)
GFR, Estimated: >60 mL/min (ref >60)
Glucose, Bld: 113 mg/dL — ABNORMAL HIGH (ref 70–99)
Potassium: 4.1 mmol/L (ref 3.5–5.1)
Sodium: 138 mmol/L (ref 135–145)

## 2022-06-19 MED ORDER — CHLORHEXIDINE GLUCONATE CLOTH 2 % EX PADS: 6.0000 | MEDICATED_PAD | Freq: Once | CUTANEOUS | Status: DC

## 2022-06-19 NOTE — Progress Notes (Signed)

## 2022-06-21 ENCOUNTER — Ambulatory Visit
Admission: RE | Admit: 2022-06-21 | Discharge: 2022-06-21 | Disposition: A | Discharge status: Home / Self Care | Payer: Medicare Other | Source: Ambulatory Visit | Attending: General Surgery | Admitting: General Surgery

## 2022-06-21 ENCOUNTER — Other Ambulatory Visit: Payer: Self-pay | Admitting: General Surgery

## 2022-06-21 DIAGNOSIS — C50812 Malignant neoplasm of overlapping sites of left female breast: Secondary | ICD-10-CM

## 2022-06-21 DIAGNOSIS — C801 Malignant (primary) neoplasm, unspecified: Secondary | ICD-10-CM | POA: Diagnosis not present

## 2022-06-21 DIAGNOSIS — C773 Secondary and unspecified malignant neoplasm of axilla and upper limb lymph nodes: Secondary | ICD-10-CM | POA: Diagnosis not present

## 2022-06-21 HISTORY — PX: BREAST BIOPSY: SHX20

## 2022-06-24 ENCOUNTER — Ambulatory Visit (HOSPITAL_COMMUNITY)
Admission: RE | Admit: 2022-06-24 | Discharge: 2022-06-24 | Disposition: A | Discharge status: Home / Self Care | Payer: Medicare Other | Source: Ambulatory Visit | Attending: General Surgery | Admitting: General Surgery

## 2022-06-24 ENCOUNTER — Encounter (HOSPITAL_BASED_OUTPATIENT_CLINIC_OR_DEPARTMENT_OTHER)
Admission: RE | Disposition: A | Discharge status: Home / Self Care | Payer: Self-pay | Source: Home / Self Care | Attending: Plastic Surgery

## 2022-06-24 ENCOUNTER — Observation Stay (HOSPITAL_BASED_OUTPATIENT_CLINIC_OR_DEPARTMENT_OTHER)
Admission: RE | Admit: 2022-06-24 | Discharge: 2022-06-25 | Disposition: A | Discharge status: Home / Self Care | Payer: Medicare Other | Attending: Plastic Surgery | Admitting: Plastic Surgery

## 2022-06-24 ENCOUNTER — Ambulatory Visit (HOSPITAL_BASED_OUTPATIENT_CLINIC_OR_DEPARTMENT_OTHER): Payer: Medicare Other | Admitting: Certified Registered"

## 2022-06-24 ENCOUNTER — Encounter (HOSPITAL_BASED_OUTPATIENT_CLINIC_OR_DEPARTMENT_OTHER): Payer: Self-pay | Admitting: General Surgery

## 2022-06-24 ENCOUNTER — Other Ambulatory Visit: Payer: Medicare Other

## 2022-06-24 ENCOUNTER — Other Ambulatory Visit: Payer: Self-pay

## 2022-06-24 ENCOUNTER — Ambulatory Visit
Admission: RE | Admit: 2022-06-24 | Discharge: 2022-06-24 | Disposition: A | Discharge status: Home / Self Care | Payer: Medicare Other | Source: Ambulatory Visit | Attending: General Surgery | Admitting: General Surgery

## 2022-06-24 DIAGNOSIS — Z79899 Other long term (current) drug therapy: Secondary | ICD-10-CM | POA: Diagnosis not present

## 2022-06-24 DIAGNOSIS — E039 Hypothyroidism, unspecified: Secondary | ICD-10-CM | POA: Diagnosis not present

## 2022-06-24 DIAGNOSIS — C50812 Malignant neoplasm of overlapping sites of left female breast: Secondary | ICD-10-CM | POA: Diagnosis not present

## 2022-06-24 DIAGNOSIS — Z421 Encounter for breast reconstruction following mastectomy: Secondary | ICD-10-CM

## 2022-06-24 DIAGNOSIS — C50912 Malignant neoplasm of unspecified site of left female breast: Secondary | ICD-10-CM | POA: Diagnosis not present

## 2022-06-24 DIAGNOSIS — I10 Essential (primary) hypertension: Secondary | ICD-10-CM | POA: Diagnosis not present

## 2022-06-24 DIAGNOSIS — Z7984 Long term (current) use of oral hypoglycemic drugs: Secondary | ICD-10-CM | POA: Insufficient documentation

## 2022-06-24 DIAGNOSIS — C50919 Malignant neoplasm of unspecified site of unspecified female breast: Secondary | ICD-10-CM | POA: Diagnosis present

## 2022-06-24 DIAGNOSIS — Z170 Estrogen receptor positive status [ER+]: Secondary | ICD-10-CM | POA: Insufficient documentation

## 2022-06-24 DIAGNOSIS — E1165 Type 2 diabetes mellitus with hyperglycemia: Secondary | ICD-10-CM

## 2022-06-24 DIAGNOSIS — G8918 Other acute postprocedural pain: Secondary | ICD-10-CM | POA: Diagnosis not present

## 2022-06-24 DIAGNOSIS — Z0389 Encounter for observation for other suspected diseases and conditions ruled out: Secondary | ICD-10-CM | POA: Diagnosis not present

## 2022-06-24 HISTORY — DX: Other specified postprocedural states: Z98.890

## 2022-06-24 HISTORY — PX: BREAST RECONSTRUCTION WITH PLACEMENT OF TISSUE EXPANDER AND FLEX HD (ACELLULAR HYDRATED DERMIS): SHX6295

## 2022-06-24 HISTORY — DX: Other specified postprocedural states: R11.2

## 2022-06-24 HISTORY — PX: MASTECTOMY: SHX3

## 2022-06-24 HISTORY — PX: RADIOACTIVE SEED GUIDED AXILLARY SENTINEL LYMPH NODE: SHX6735

## 2022-06-24 HISTORY — PX: MASTECTOMY W/ SENTINEL NODE BIOPSY: SHX2001

## 2022-06-24 HISTORY — DX: Hyperlipidemia, unspecified: E78.5

## 2022-06-24 HISTORY — DX: Type 2 diabetes mellitus without complications: E11.9

## 2022-06-24 LAB — GLUCOSE, CAPILLARY: Glucose-Capillary: 132 mg/dL — ABNORMAL HIGH (ref 70–99)

## 2022-06-24 SURGERY — MASTECTOMY WITH SENTINEL LYMPH NODE BIOPSY
Anesthesia: Regional | Site: Breast | Laterality: Left

## 2022-06-24 MED ORDER — ACETAMINOPHEN 500 MG PO TABS
ORAL_TABLET | ORAL | Status: AC
  Filled 2022-06-24: qty 2

## 2022-06-24 MED ORDER — ACETAMINOPHEN 500 MG PO TABS: 1000.0000 mg | ORAL_TABLET | Freq: Once | ORAL | Status: DC

## 2022-06-24 MED ORDER — METFORMIN HCL 500 MG PO TABS
500.0000 mg | ORAL_TABLET | Freq: Two times a day (BID) | ORAL | Status: DC
  Administered 2022-06-24: 500 mg via ORAL
  Filled 2022-06-24: qty 1

## 2022-06-24 MED ORDER — CHLORHEXIDINE GLUCONATE CLOTH 2 % EX PADS: 6.0000 | MEDICATED_PAD | Freq: Once | CUTANEOUS | Status: DC

## 2022-06-24 MED ORDER — GABAPENTIN 300 MG PO CAPS
300.0000 mg | ORAL_CAPSULE | ORAL | Status: AC
  Administered 2022-06-24: 300 mg via ORAL

## 2022-06-24 MED ORDER — DEXAMETHASONE SODIUM PHOSPHATE 4 MG/ML IJ SOLN
INTRAMUSCULAR | Status: DC | PRN
  Administered 2022-06-24: 4 mg via INTRAVENOUS

## 2022-06-24 MED ORDER — ZOLPIDEM TARTRATE 5 MG PO TABS: 5.0000 mg | ORAL_TABLET | Freq: Every evening | ORAL | Status: DC | PRN

## 2022-06-24 MED ORDER — ROPIVACAINE HCL 5 MG/ML IJ SOLN
INTRAMUSCULAR | Status: DC | PRN
  Administered 2022-06-24: 30 mL via PERINEURAL

## 2022-06-24 MED ORDER — PHENYLEPHRINE 80 MCG/ML (10ML) SYRINGE FOR IV PUSH (FOR BLOOD PRESSURE SUPPORT)
PREFILLED_SYRINGE | INTRAVENOUS | Status: AC
  Filled 2022-06-24: qty 10

## 2022-06-24 MED ORDER — ONDANSETRON 4 MG PO TBDP: 4.0000 mg | ORAL_TABLET | Freq: Four times a day (QID) | ORAL | Status: DC | PRN

## 2022-06-24 MED ORDER — MIDAZOLAM HCL 2 MG/2ML IJ SOLN
2.0000 mg | Freq: Once | INTRAMUSCULAR | Status: AC
  Administered 2022-06-24: 2 mg via INTRAVENOUS

## 2022-06-24 MED ORDER — PROPOFOL 500 MG/50ML IV EMUL
INTRAVENOUS | Status: AC
  Filled 2022-06-24: qty 50

## 2022-06-24 MED ORDER — DEXMEDETOMIDINE HCL IN NACL 80 MCG/20ML IV SOLN
INTRAVENOUS | Status: AC
  Filled 2022-06-24: qty 20

## 2022-06-24 MED ORDER — DEXAMETHASONE SODIUM PHOSPHATE 10 MG/ML IJ SOLN
INTRAMUSCULAR | Status: AC
  Filled 2022-06-24: qty 1

## 2022-06-24 MED ORDER — LACTATED RINGERS IV SOLN: INTRAVENOUS | Status: DC

## 2022-06-24 MED ORDER — FENTANYL CITRATE (PF) 100 MCG/2ML IJ SOLN
INTRAMUSCULAR | Status: DC | PRN
  Administered 2022-06-24: 25 ug via INTRAVENOUS
  Administered 2022-06-24 (×2): 50 ug via INTRAVENOUS
  Administered 2022-06-24: 25 ug via INTRAVENOUS

## 2022-06-24 MED ORDER — TECHNETIUM TC 99M TILMANOCEPT KIT
1.0000 | PACK | Freq: Once | INTRAVENOUS | Status: AC | PRN
  Administered 2022-06-24: 1 via INTRADERMAL

## 2022-06-24 MED ORDER — DIPHENHYDRAMINE HCL 12.5 MG/5ML PO ELIX: 12.5000 mg | ORAL_SOLUTION | Freq: Four times a day (QID) | ORAL | Status: DC | PRN

## 2022-06-24 MED ORDER — ACETAMINOPHEN 325 MG PO TABS
325.0000 mg | ORAL_TABLET | Freq: Four times a day (QID) | ORAL | Status: DC
  Administered 2022-06-24 – 2022-06-25 (×3): 325 mg via ORAL
  Filled 2022-06-24 (×3): qty 1

## 2022-06-24 MED ORDER — FENTANYL CITRATE (PF) 100 MCG/2ML IJ SOLN: 25.0000 ug | INTRAMUSCULAR | Status: DC | PRN

## 2022-06-24 MED ORDER — SENNA 8.6 MG PO TABS: 1.0000 | ORAL_TABLET | Freq: Two times a day (BID) | ORAL | Status: DC

## 2022-06-24 MED ORDER — EPHEDRINE 5 MG/ML INJ
INTRAVENOUS | Status: AC
  Filled 2022-06-24: qty 5

## 2022-06-24 MED ORDER — AMISULPRIDE (ANTIEMETIC) 5 MG/2ML IV SOLN
10.0000 mg | Freq: Once | INTRAVENOUS | Status: AC
  Administered 2022-06-24: 10 mg via INTRAVENOUS

## 2022-06-24 MED ORDER — IBUPROFEN 200 MG PO TABS
400.0000 mg | ORAL_TABLET | Freq: Four times a day (QID) | ORAL | Status: DC
  Administered 2022-06-24 – 2022-06-25 (×3): 400 mg via ORAL
  Filled 2022-06-24 (×3): qty 2

## 2022-06-24 MED ORDER — MIDAZOLAM HCL 2 MG/2ML IJ SOLN
INTRAMUSCULAR | Status: AC
  Filled 2022-06-24: qty 2

## 2022-06-24 MED ORDER — SODIUM CHLORIDE 0.9 % IV SOLN
INTRAVENOUS | Status: DC | PRN
  Administered 2022-06-24: 500 mL

## 2022-06-24 MED ORDER — PROPOFOL 500 MG/50ML IV EMUL
INTRAVENOUS | Status: DC | PRN
  Administered 2022-06-24: 35 ug/kg/min via INTRAVENOUS

## 2022-06-24 MED ORDER — CEFAZOLIN SODIUM-DEXTROSE 2-4 GM/100ML-% IV SOLN
2.0000 g | Freq: Three times a day (TID) | INTRAVENOUS | Status: DC
  Administered 2022-06-24 – 2022-06-25 (×2): 2 g via INTRAVENOUS
  Filled 2022-06-24 (×2): qty 100

## 2022-06-24 MED ORDER — SCOPOLAMINE 1 MG/3DAYS TD PT72
MEDICATED_PATCH | TRANSDERMAL | Status: AC
  Filled 2022-06-24: qty 1

## 2022-06-24 MED ORDER — HYDROMORPHONE HCL 1 MG/ML IJ SOLN: 1.0000 mg | INTRAMUSCULAR | Status: DC | PRN

## 2022-06-24 MED ORDER — GABAPENTIN 300 MG PO CAPS
ORAL_CAPSULE | ORAL | Status: AC
  Filled 2022-06-24: qty 1

## 2022-06-24 MED ORDER — AMISULPRIDE (ANTIEMETIC) 5 MG/2ML IV SOLN
INTRAVENOUS | Status: AC
  Filled 2022-06-24: qty 4

## 2022-06-24 MED ORDER — SODIUM CHLORIDE 0.9 % IV SOLN
INTRAVENOUS | Status: AC
  Filled 2022-06-24: qty 10

## 2022-06-24 MED ORDER — LIDOCAINE HCL (CARDIAC) PF 100 MG/5ML IV SOSY
PREFILLED_SYRINGE | INTRAVENOUS | Status: DC | PRN
  Administered 2022-06-24: 20 mg via INTRAVENOUS

## 2022-06-24 MED ORDER — DIPHENHYDRAMINE HCL 50 MG/ML IJ SOLN: 12.5000 mg | Freq: Four times a day (QID) | INTRAMUSCULAR | Status: DC | PRN

## 2022-06-24 MED ORDER — ONDANSETRON HCL 4 MG/2ML IJ SOLN: 4.0000 mg | Freq: Four times a day (QID) | INTRAMUSCULAR | Status: DC | PRN

## 2022-06-24 MED ORDER — FENTANYL CITRATE (PF) 100 MCG/2ML IJ SOLN
INTRAMUSCULAR | Status: AC
  Filled 2022-06-24: qty 2

## 2022-06-24 MED ORDER — SCOPOLAMINE 1 MG/3DAYS TD PT72
1.0000 | MEDICATED_PATCH | TRANSDERMAL | Status: DC
  Administered 2022-06-24: 1.5 mg via TRANSDERMAL

## 2022-06-24 MED ORDER — OXYCODONE HCL 5 MG PO TABS: 5.0000 mg | ORAL_TABLET | Freq: Once | ORAL | Status: DC

## 2022-06-24 MED ORDER — CEFAZOLIN SODIUM-DEXTROSE 2-4 GM/100ML-% IV SOLN: 2.0000 g | INTRAVENOUS | Status: AC

## 2022-06-24 MED ORDER — FENTANYL CITRATE (PF) 100 MCG/2ML IJ SOLN
100.0000 ug | Freq: Once | INTRAMUSCULAR | Status: AC
  Administered 2022-06-24: 100 ug via INTRAVENOUS

## 2022-06-24 MED ORDER — FENTANYL CITRATE (PF) 100 MCG/2ML IJ SOLN: 100.0000 ug | Freq: Once | INTRAMUSCULAR | Status: DC

## 2022-06-24 MED ORDER — METHOCARBAMOL 500 MG PO TABS
500.0000 mg | ORAL_TABLET | Freq: Four times a day (QID) | ORAL | Status: DC | PRN
  Administered 2022-06-24: 500 mg via ORAL
  Filled 2022-06-24: qty 1

## 2022-06-24 MED ORDER — CARVEDILOL 6.25 MG PO TABS
6.2500 mg | ORAL_TABLET | Freq: Two times a day (BID) | ORAL | Status: DC
  Administered 2022-06-24: 6.25 mg via ORAL
  Filled 2022-06-24: qty 1

## 2022-06-24 MED ORDER — LIDOCAINE 2% (20 MG/ML) 5 ML SYRINGE
INTRAMUSCULAR | Status: AC
  Filled 2022-06-24: qty 15

## 2022-06-24 MED ORDER — CEFAZOLIN SODIUM-DEXTROSE 2-4 GM/100ML-% IV SOLN
INTRAVENOUS | Status: AC
  Filled 2022-06-24: qty 100

## 2022-06-24 MED ORDER — POTASSIUM CHLORIDE IN NACL 20-0.45 MEQ/L-% IV SOLN
INTRAVENOUS | Status: DC
  Filled 2022-06-24: qty 1000

## 2022-06-24 MED ORDER — ROCURONIUM BROMIDE 10 MG/ML (PF) SYRINGE
PREFILLED_SYRINGE | INTRAVENOUS | Status: AC
  Filled 2022-06-24: qty 10

## 2022-06-24 MED ORDER — ONDANSETRON HCL 4 MG/2ML IJ SOLN
INTRAMUSCULAR | Status: AC
  Filled 2022-06-24: qty 12

## 2022-06-24 MED ORDER — ONDANSETRON HCL 4 MG/2ML IJ SOLN
INTRAMUSCULAR | Status: DC | PRN
  Administered 2022-06-24: 4 mg via INTRAVENOUS

## 2022-06-24 MED ORDER — POLYETHYLENE GLYCOL 3350 17 G PO PACK: 17.0000 g | PACK | Freq: Every day | ORAL | Status: DC | PRN

## 2022-06-24 MED ORDER — DEXAMETHASONE SODIUM PHOSPHATE 10 MG/ML IJ SOLN
INTRAMUSCULAR | Status: DC | PRN
  Administered 2022-06-24: 10 mg

## 2022-06-24 MED ORDER — PROPOFOL 10 MG/ML IV BOLUS
INTRAVENOUS | Status: DC | PRN
  Administered 2022-06-24: 150 mg via INTRAVENOUS

## 2022-06-24 MED ORDER — MIDAZOLAM HCL 2 MG/2ML IJ SOLN: 2.0000 mg | Freq: Once | INTRAMUSCULAR | Status: DC

## 2022-06-24 MED ORDER — ACETAMINOPHEN 500 MG PO TABS
1000.0000 mg | ORAL_TABLET | ORAL | Status: AC
  Administered 2022-06-24: 1000 mg via ORAL

## 2022-06-24 MED ORDER — GABAPENTIN 300 MG PO CAPS: 300.0000 mg | ORAL_CAPSULE | ORAL | Status: AC

## 2022-06-24 MED ORDER — ACETAMINOPHEN 500 MG PO TABS: 1000.0000 mg | ORAL_TABLET | ORAL | Status: AC

## 2022-06-24 MED ORDER — CEFAZOLIN SODIUM-DEXTROSE 2-4 GM/100ML-% IV SOLN
2.0000 g | INTRAVENOUS | Status: AC
  Administered 2022-06-24: 2 g via INTRAVENOUS

## 2022-06-24 MED ORDER — BUPIVACAINE LIPOSOME 1.3 % IJ SUSP
INTRAMUSCULAR | Status: AC
  Filled 2022-06-24: qty 20

## 2022-06-24 MED ORDER — CEFAZOLIN SODIUM-DEXTROSE 2-4 GM/100ML-% IV SOLN
2.0000 g | INTRAVENOUS | Status: AC
  Filled 2022-06-24: qty 100

## 2022-06-24 SURGICAL SUPPLY — 86 items
ADH SKN CLS APL DERMABOND .7 (GAUZE/BANDAGES/DRESSINGS) ×2
APL PRP STRL LF DISP 70% ISPRP (MISCELLANEOUS) ×2
APPLIER CLIP 9.375 MED OPEN (MISCELLANEOUS) ×2
APR CLP MED 9.3 20 MLT OPN (MISCELLANEOUS) ×2
BAG DECANTER FOR FLEXI CONT (MISCELLANEOUS) ×2 IMPLANT
BINDER BREAST LRG (GAUZE/BANDAGES/DRESSINGS) IMPLANT
BINDER BREAST MEDIUM (GAUZE/BANDAGES/DRESSINGS) IMPLANT
BINDER BREAST XLRG (GAUZE/BANDAGES/DRESSINGS) IMPLANT
BINDER BREAST XXLRG (GAUZE/BANDAGES/DRESSINGS) IMPLANT
BIOPATCH RED 1 DISK 7.0 (GAUZE/BANDAGES/DRESSINGS) ×2 IMPLANT
BLADE HEX COATED 2.75 (ELECTRODE) ×2 IMPLANT
BLADE SURG 10 STRL SS (BLADE) ×2 IMPLANT
BLADE SURG 15 STRL LF DISP TIS (BLADE) ×2 IMPLANT
BLADE SURG 15 STRL SS (BLADE) ×2
BNDG GAUZE DERMACEA FLUFF 4 (GAUZE/BANDAGES/DRESSINGS) ×4 IMPLANT
BNDG GZE DERMACEA 4 6PLY (GAUZE/BANDAGES/DRESSINGS) ×4
CANISTER SUCT 1200ML W/VALVE (MISCELLANEOUS) ×2 IMPLANT
CHLORAPREP W/TINT 26 (MISCELLANEOUS) ×2 IMPLANT
CLIP APPLIE 9.375 MED OPEN (MISCELLANEOUS) ×2 IMPLANT
COVER BACK TABLE 60X90IN (DRAPES) ×2 IMPLANT
COVER MAYO STAND STRL (DRAPES) ×2 IMPLANT
COVER PROBE CYLINDRICAL 5X96 (MISCELLANEOUS) ×2 IMPLANT
COVER SURGICAL LIGHT HANDLE (MISCELLANEOUS) IMPLANT
DERMABOND ADVANCED .7 DNX12 (GAUZE/BANDAGES/DRESSINGS) ×2 IMPLANT
DEVICE DSSCT PLSMBLD 3.0S LGHT (MISCELLANEOUS) ×2 IMPLANT
DRAIN CHANNEL 19F RND (DRAIN) ×2 IMPLANT
DRAPE LAPAROSCOPIC ABDOMINAL (DRAPES) ×2 IMPLANT
DRAPE UTILITY XL STRL (DRAPES) ×2 IMPLANT
DRSG OPSITE POSTOP 4X6 (GAUZE/BANDAGES/DRESSINGS) IMPLANT
DRSG TEGADERM 2-3/8X2-3/4 SM (GAUZE/BANDAGES/DRESSINGS) ×2 IMPLANT
ELECT BLADE 4.0 EZ CLEAN MEGAD (MISCELLANEOUS) ×2
ELECT COATED BLADE 2.86 ST (ELECTRODE) IMPLANT
ELECT REM PT RETURN 9FT ADLT (ELECTROSURGICAL) ×2
ELECTRODE BLDE 4.0 EZ CLN MEGD (MISCELLANEOUS) ×2 IMPLANT
ELECTRODE REM PT RTRN 9FT ADLT (ELECTROSURGICAL) ×2 IMPLANT
EVACUATOR SILICONE 100CC (DRAIN) ×2 IMPLANT
FUNNEL KELLER 2 DISP (MISCELLANEOUS) IMPLANT
GAUZE PAD ABD 8X10 STRL (GAUZE/BANDAGES/DRESSINGS) ×4 IMPLANT
GAUZE SPONGE 4X4 12PLY STRL LF (GAUZE/BANDAGES/DRESSINGS) ×2 IMPLANT
GLOVE BIO SURGEON STRL SZ 6.5 (GLOVE) ×4 IMPLANT
GLOVE BIO SURGEON STRL SZ7.5 (GLOVE) ×2 IMPLANT
GOWN STRL REUS W/ TWL LRG LVL3 (GOWN DISPOSABLE) ×6 IMPLANT
GOWN STRL REUS W/TWL LRG LVL3 (GOWN DISPOSABLE) ×6
HEMOSTAT ARISTA ABSORB 3G PWDR (HEMOSTASIS) IMPLANT
IMPL EXPANDER BREAST 535CC (Breast) IMPLANT
IMPLANT EXPANDER BREAST 535CC (Breast) ×2 IMPLANT
IV NS 1000ML (IV SOLUTION)
IV NS 1000ML BAXH (IV SOLUTION) IMPLANT
IV NS 500ML (IV SOLUTION) ×2
IV NS 500ML BAXH (IV SOLUTION) ×2 IMPLANT
KIT FILL ASEPTIC TRANSFER (MISCELLANEOUS) IMPLANT
NDL HYPO 25X1 1.5 SAFETY (NEEDLE) ×2 IMPLANT
NEEDLE HYPO 25X1 1.5 SAFETY (NEEDLE) ×2 IMPLANT
NS IRRIG 1000ML POUR BTL (IV SOLUTION) ×2 IMPLANT
PACK BASIN DAY SURGERY FS (CUSTOM PROCEDURE TRAY) ×2 IMPLANT
PAD FOAM SILICONE BACKED (GAUZE/BANDAGES/DRESSINGS) IMPLANT
PENCIL SMOKE EVACUATOR (MISCELLANEOUS) ×2 IMPLANT
PIN SAFETY STERILE (MISCELLANEOUS) ×2 IMPLANT
PLASMABLADE 3.0S W/LIGHT (MISCELLANEOUS) ×2
SLEEVE SCD COMPRESS KNEE MED (STOCKING) ×2 IMPLANT
SPIKE FLUID TRANSFER (MISCELLANEOUS) IMPLANT
SPONGE T-LAP 18X18 ~~LOC~~+RFID (SPONGE) ×4 IMPLANT
STRIP SUTURE WOUND CLOSURE 1/2 (MISCELLANEOUS) IMPLANT
SUT ETHILON 2 0 FS 18 (SUTURE) ×2 IMPLANT
SUT MNCRL AB 4-0 PS2 18 (SUTURE) ×2 IMPLANT
SUT MON AB 3-0 SH 27 (SUTURE) ×2
SUT MON AB 3-0 SH27 (SUTURE) ×2 IMPLANT
SUT MON AB 5-0 PS2 18 (SUTURE) IMPLANT
SUT PDS 3-0 CT2 (SUTURE) ×2
SUT PDS AB 2-0 CT2 27 (SUTURE) IMPLANT
SUT PDS II 3-0 CT2 27 ABS (SUTURE) IMPLANT
SUT SILK 2 0 SH (SUTURE) IMPLANT
SUT SILK 3 0 PS 1 (SUTURE) ×2 IMPLANT
SUT VIC AB 3-0 SH 27 (SUTURE)
SUT VIC AB 3-0 SH 27X BRD (SUTURE) IMPLANT
SUT VICRYL 3-0 CR8 SH (SUTURE) ×2 IMPLANT
SYR BULB IRRIG 60ML STRL (SYRINGE) ×2 IMPLANT
SYR CONTROL 10ML LL (SYRINGE) ×2 IMPLANT
TISSUE FLEXHD PERF PLIAB 8X16 (Tissue) IMPLANT
TOWEL GREEN STERILE FF (TOWEL DISPOSABLE) ×4 IMPLANT
TRACER MAGTRACE VIAL (MISCELLANEOUS) IMPLANT
TRAY DSU PREP LF (CUSTOM PROCEDURE TRAY) ×2 IMPLANT
TRAY FOLEY W/BAG SLVR 14FR LF (SET/KITS/TRAYS/PACK) IMPLANT
TUBE CONNECTING 20X1/4 (TUBING) ×2 IMPLANT
UNDERPAD 30X36 HEAVY ABSORB (UNDERPADS AND DIAPERS) ×4 IMPLANT
YANKAUER SUCT BULB TIP NO VENT (SUCTIONS) ×2 IMPLANT

## 2022-06-24 NOTE — Anesthesia Preprocedure Evaluation (Addendum)
Anesthesia Evaluation  Patient identified by MRN, date of birth, ID band Patient awake    Reviewed: Allergy & Precautions, NPO status , Patient's Chart, lab work & pertinent test results  History of Anesthesia Complications (+) PONV and history of anesthetic complications  Airway Mallampati: II  TM Distance: >3 FB Neck ROM: Full    Dental  (+) Chipped, Dental Advisory Given,    Pulmonary neg pulmonary ROS   Pulmonary exam normal breath sounds clear to auscultation       Cardiovascular hypertension, Pt. on home beta blockers and Pt. on medications Normal cardiovascular exam Rhythm:Regular Rate:Normal     Neuro/Psych  Headaches PSYCHIATRIC DISORDERS Anxiety        GI/Hepatic negative GI ROS, Neg liver ROS,,,  Endo/Other  diabetes, Type 2, Oral Hypoglycemic AgentsHypothyroidism    Renal/GU negative Renal ROS  negative genitourinary   Musculoskeletal negative musculoskeletal ROS (+)    Abdominal   Peds  Hematology negative hematology ROS (+)   Anesthesia Other Findings   Reproductive/Obstetrics                             Anesthesia Physical Anesthesia Plan  ASA: 2  Anesthesia Plan: General and Regional   Post-op Pain Management: Tylenol PO (pre-op)* and Regional block*   Induction: Intravenous  PONV Risk Score and Plan: 4 or greater and Ondansetron, Dexamethasone, Midazolam, Treatment may vary due to age or medical condition and Scopolamine patch - Pre-op  Airway Management Planned: LMA  Additional Equipment:   Intra-op Plan:   Post-operative Plan: Extubation in OR  Informed Consent: I have reviewed the patients History and Physical, chart, labs and discussed the procedure including the risks, benefits and alternatives for the proposed anesthesia with the patient or authorized representative who has indicated his/her understanding and acceptance.     Dental advisory  given  Plan Discussed with: CRNA  Anesthesia Plan Comments:        Anesthesia Quick Evaluation

## 2022-06-24 NOTE — Anesthesia Procedure Notes (Signed)
Anesthesia Regional Block: Pectoralis block   Pre-Anesthetic Checklist: , timeout performed,  Correct Patient, Correct Site, Correct Laterality,  Correct Procedure, Correct Position, site marked,  Risks and benefits discussed,  Pre-op evaluation,  At surgeon's request and post-op pain management  Laterality: Left  Prep: Maximum Sterile Barrier Precautions used, chloraprep       Needles:  Injection technique: Single-shot  Needle Type: Echogenic Stimulator Needle     Needle Length: 9cm  Needle Gauge: 21     Additional Needles:   Procedures:,,,, ultrasound used (permanent image in chart),,    Narrative:  Start time: 06/24/2022 11:13 AM End time: 06/24/2022 11:17 AM Injection made incrementally with aspirations every 5 mL. Anesthesiologist: Freddrick March, MD

## 2022-06-24 NOTE — Interval H&P Note (Signed)
History and Physical Interval Note:  06/24/2022 11:23 AM  Diane Hodges  has presented today for surgery, with the diagnosis of LEFT BREAST CANCER.  The various methods of treatment have been discussed with the patient and family. After consideration of risks, benefits and other options for treatment, the patient has consented to  Procedure(s): LEFT MASTECTOMY WITH SENTINEL LYMPH NODE BIOPSY (Left) RADIOACTIVE SEED GUIDED LEFT AXILLARY SENTINEL LYMPH NODE DISSECTION (Left) IMMEDIATE LEFT BREAST RECONSTRUCTION WITH PLACEMENT OF TISSUE EXPANDER AND FLEX HD (ACELLULAR HYDRATED DERMIS) (Left) as a surgical intervention.  The patient's history has been reviewed, patient examined, no change in status, stable for surgery.  I have reviewed the patient's chart and labs.  Questions were answered to the patient's satisfaction.     Autumn Messing III

## 2022-06-24 NOTE — Progress Notes (Signed)
Patient had an increase of jp outage after getting up to go to the restroom. Output is 241 in one hour. VS stable and patient is not complaining of pain at this time. Incision looks unremarkable and soft to touch. Lyndee Leo, [place] called. No new orders at this time. Will continue to closely monitor patient.

## 2022-06-24 NOTE — H&P (Signed)
REFERRING PHYSICIAN: Truitt Merle, MD  PROVIDER: Landry Corporal, MD  MRN: [id number] DOB: [birth year] Subjective  Chief Complaint: Breast Cancer   History of Present Illness: Diane Hodges is a 67 y.o. female who is seen today as an office consultation for evaluation of Breast Cancer .  We are asked to see the patient in consultation by Dr. Burr Medico to evaluate her for a new left breast cancer. The patient is a 67 year old white female who recently went for a routine screening mammogram. At that time she was found to have an asymmetry in the central and lateral part of the left breast. The axilla look normal. 2 areas of the asymmetry were biopsied and both came back as grade 1 invasive lobular cancer that was ER and PR positive and HER2 negative with a Ki-67 of 2%. There is a third area of asymmetry laterally that has not been biopsied yet. She does have hypertension and diabetes. She does not smoke.  Review of Systems: A complete review of systems was obtained from the patient. I have reviewed this information and discussed as appropriate with the patient. See HPI as well for other ROS.  ROS  Medical History: Past Medical History: Diagnosis Date History of cancer Hyperlipidemia Hypertension Thyroid disease  Patient Active Problem List Diagnosis Hypothyroidism Malignant neoplasm of overlapping sites of left breast in female, estrogen receptor positive Uncontrolled type 2 diabetes mellitus with hyperglycemia (CMS-HCC) Hyperlipidemia Nasal congestion Easy bruising Bleeds easily (CMS-HCC)  Past Surgical History: Procedure Laterality Date OOPHORECTOMY N/A 1980 CATARACT EXTRACTION Right 2015 .breast biopsy Left 04/08/2022   Allergies Allergen Reactions Azithromycin Rash REACTION: rash  Current Outpatient Medications on File Prior to Visit Medication Sig Dispense Refill carvediloL (COREG) 6.25 MG tablet Take 6.25 mg by mouth 2 (two) times daily with meals fenofibrate 54 MG  tablet Take 1 tablet by mouth once daily levothyroxine (SYNTHROID) 112 MCG tablet Take by mouth every morning before breakfast (0630) metFORMIN (GLUCOPHAGE) 500 MG tablet Take 500 mg by mouth 2 (two) times daily with meals omega-3 fatty acids-fish oil 360-1,200 mg Cap Take 1 capsule by mouth once daily rosuvastatin (CRESTOR) 10 MG tablet Take 1 tablet by mouth once daily semaglutide (OZEMPIC) 0.25 mg or 0.5 mg (2 mg/3 mL) pen injector Inject 0.25 mg subcutaneously once a week vitamin E 400 UNIT capsule Take 400 Units by mouth once daily  No current facility-administered medications on file prior to visit.  Family History Problem Relation Age of Onset Skin cancer Mother Skin cancer Father Hyperlipidemia (Elevated cholesterol) Father   Social History  Tobacco Use Smoking Status Never Smokeless Tobacco Never   Social History  Socioeconomic History Marital status: Married Tobacco Use Smoking status: Never Smokeless tobacco: Never Vaping Use Vaping Use: Never used Substance and Sexual Activity Alcohol use: Never Drug use: Never  Objective: There were no vitals filed for this visit. There is no height or weight on file to calculate BMI.  Physical Exam Vitals reviewed. Constitutional: General: She is not in acute distress. Appearance: Normal appearance. HENT: Head: Normocephalic and atraumatic. Right Ear: External ear normal. Left Ear: External ear normal. Nose: Nose normal. Mouth/Throat: Mouth: Mucous membranes are moist. Pharynx: Oropharynx is clear. Eyes: General: No scleral icterus. Extraocular Movements: Extraocular movements intact. Conjunctiva/sclera: Conjunctivae normal. Pupils: Pupils are equal, round, and reactive to light. Cardiovascular: Rate and Rhythm: Normal rate and regular rhythm. Pulses: Normal pulses. Heart sounds: Normal heart sounds. Pulmonary: Effort: Pulmonary effort is normal. No respiratory distress. Breath sounds: Normal breath  sounds. Abdominal:  General: Bowel sounds are normal. Palpations: Abdomen is soft. Tenderness: There is no abdominal tenderness. Musculoskeletal: General: No swelling, tenderness or deformity. Normal range of motion. Cervical back: Normal range of motion and neck supple. Skin: General: Skin is warm and dry. Coloration: Skin is not jaundiced. Neurological: General: No focal deficit present. Mental Status: She is alert and oriented to person, place, and time. Psychiatric: Mood and Affect: Mood normal. Behavior: Behavior normal.    Breast: There is a palpable bruise in the upper central left breast. Other than this there is no palpable mass in either breast. There is no palpable axillary, supraclavicular, or cervical lymphadenopathy.  Labs, Imaging and Diagnostic Testing:  Assessment and Plan:  Diagnoses and all orders for this visit:  Malignant neoplasm of overlapping sites of left breast in female, estrogen receptor positive    The patient appears to have a fairly large area of invasive lobular cancer in the central and lateral left breast with clinically negative nodes. At this point we will evaluate her further with MRI with likely biopsies of the more lateral distortion. If this is also invasive lobular cancer then I would say at this point she may be a better candidate for mastectomy and sentinel node biopsy. If she does need a mastectomy she would be interested in reconstruction. We will call her with the results of the MRI and I will refer her to Dr. Marla Roe in plastic surgery to consider reconstruction options. She will also meet with medical and radiation oncology to discuss adjuvant therapy. We will make a decision together on surgery once the MRI and biopsies are complete.

## 2022-06-24 NOTE — Transfer of Care (Signed)
Immediate Anesthesia Transfer of Care Note  Patient: Diane Hodges  Procedure(s) Performed: LEFT MASTECTOMY WITH SENTINEL LYMPH NODE BIOPSY (Left: Breast) RADIOACTIVE SEED GUIDED LEFT AXILLARY SENTINEL LYMPH NODE DISSECTION (Left) IMMEDIATE LEFT BREAST RECONSTRUCTION WITH PLACEMENT OF TISSUE EXPANDER AND FLEX HD (ACELLULAR HYDRATED DERMIS) (Left: Breast)  Patient Location: PACU  Anesthesia Type:GA combined with regional for post-op pain  Level of Consciousness: drowsy and patient cooperative  Airway & Oxygen Therapy: Patient Spontanous Breathing and Patient connected to face mask oxygen  Post-op Assessment: Report given to RN and Post -op Vital signs reviewed and stable  Post vital signs: Reviewed and stable  Last Vitals:  Vitals Value Taken Time  BP 119/62 06/24/22 1426  Temp    Pulse 97 06/24/22 1427  Resp    SpO2 93 % 06/24/22 1427  Vitals shown include unvalidated device data.  Last Pain:  Vitals:   06/24/22 1025  TempSrc: Oral  PainSc: 0-No pain      Patients Stated Pain Goal: 4 ([id number])  Complications: No notable events documented.

## 2022-06-24 NOTE — Anesthesia Procedure Notes (Signed)
Procedure Name: LMA Insertion Date/Time: 06/24/2022 12:15 PM  Performed by: Rozell Kettlewell, Ernesta Amble, CRNAPre-anesthesia Checklist: Patient identified, Emergency Drugs available, Suction available and Patient being monitored Patient Re-evaluated:Patient Re-evaluated prior to induction Oxygen Delivery Method: Circle system utilized Preoxygenation: Pre-oxygenation with 100% oxygen Induction Type: IV induction Ventilation: Mask ventilation without difficulty LMA: LMA inserted LMA Size: 4.0 Number of attempts: 1 Airway Equipment and Method: Bite block Placement Confirmation: positive ETCO2 Tube secured with: Tape Dental Injury: Teeth and Oropharynx as per pre-operative assessment

## 2022-06-24 NOTE — Op Note (Signed)
06/24/2022  1:41 PM  PATIENT:  Diane Hodges  67 y.o. female  PRE-OPERATIVE DIAGNOSIS:  LEFT BREAST CANCER  POST-OPERATIVE DIAGNOSIS:  LEFT BREAST CANCER  PROCEDURE:  Procedure(s): LEFT MASTECTOMY WITH DEEP LEFT AXILLARY SENTINEL LYMPH NODE BIOPSY (Left) RADIOACTIVE SEED GUIDED DEEP LEFT AXILLARY TARGETED LYMPH NODE DISSECTION   SURGEON:  Surgeon(s) and Role: Panel 1:    * Jovita Kussmaul, MD - Primary  PHYSICIAN ASSISTANT:   ASSISTANTS: none   ANESTHESIA:   general  EBL:  100 mL   BLOOD ADMINISTERED:none  DRAINS: none   LOCAL MEDICATIONS USED:  NONE  SPECIMEN:  Source of Specimen:  Left mastectomy with sentinel node x 2 and targeted node dissection  DISPOSITION OF SPECIMEN:  PATHOLOGY  COUNTS:  YES  TOURNIQUET:  * No tourniquets in log *  DICTATION: .Dragon Dictation  After informed consent was obtained the patient was brought to the operating room and placed in the supine position on the operating table.  After adequate induction of general anesthesia the patient's left chest, breast, and axillary area were prepped with ChloraPrep, allowed to dry, and draped in usual sterile manner.  An appropriate timeout was performed.  Earlier in the day the patient underwent injection of 1 mCi of technetium sulfur colloid in the subareolar position on the left.  Previously an I-125 seed was placed in the left axilla to mark an area of previously biopsied positive lymph node.  Attention was first turned to the breast itself.  An elliptical incision was made around the nipple and areola complex in order to spare as much skin as possible.  The incision was carried through the skin and subcutaneous tissue sharply with the PlasmaBlade.  Breast hooks were used to elevate the skin flaps anteriorly towards the ceiling.  Thin skin flaps were then created by dissecting between the breast tissue in the subcutaneous fat and skin.  This dissection was carried circumferentially all the way to the  muscle of the chest wall.  Next the breast was removed from the pectoralis muscle with the pectoralis fascia.  Once this was accomplished the entire left breast was removed from the patient.  It was marked with a stitch on the lateral skin and sent to pathology for further evaluation.  The neoprobe was then set to I-125 in the area of the radioactive seed and node were readily identified.  The deep left axillary space was entered sharply with the PlasmaBlade.  This area was excised sharply with the PlasmaBlade and surrounding small vessels and lymphatics were controlled with clips.  This was sent as targeted node.  The neoprobe was then sent to technetium.  A signal was readily identified in 2 places in the deep left axillary space.  Dissection was carried towards each signal under the direction of the neoprobe.  I was able to identify 2 sentinel nodes.  These were excised sharply with the PlasmaBlade and the surrounding small vessels and lymphatics were also controlled with clips.  Ex vivo counts on these nodes ranged from 20-200.  No other hot or palpable nodes were identified in the left axilla.  Hemostasis was achieved using the PlasmaBlade.  The wound was irrigated with copious amounts of saline.  The wound was then packed with a moistened lap sponge.  The patient tolerated the procedure well.  At the end of this portion of the case all needle sponge and instrument counts were correct.  The operation was then turned over to Dr. Marla Roe for the reconstruction.  Her portion will be dictated separately.  PLAN OF CARE: Admit for overnight observation  PATIENT DISPOSITION:  PACU - hemodynamically stable.   Delay start of Pharmacological VTE agent (>24hrs) due to surgical blood loss or risk of bleeding: no

## 2022-06-24 NOTE — Discharge Instructions (Signed)
Post Anesthesia Home Care Instructions  Activity: Get plenty of rest for the remainder of the day. A responsible individual must stay with you for 24 hours following the procedure.  For the next 24 hours, DO NOT: -Drive a car -Advertising copywriter -Drink alcoholic beverages -Take any medication unless instructed by your physician -Make any legal decisions or sign important papers.  Meals: Start with liquid foods such as gelatin or soup. Progress to regular foods as tolerated. Avoid greasy, spicy, heavy foods. If nausea and/or vomiting occur, drink only clear liquids until the nausea and/or vomiting subsides. Call your physician if vomiting continues.  Special Instructions/Symptoms: Your throat may feel dry or sore from the anesthesia or the breathing tube placed in your throat during surgery. If this causes discomfort, gargle with warm salt water. The discomfort should disappear within 24 hours.  If you had a scopolamine patch placed behind your ear for the management of post- operative nausea and/or vomiting:  1. The medication in the patch is effective for 72 hours, after which it should be removed.  Wrap patch in a tissue and discard in the trash. Wash hands thoroughly with soap and water. 2. You may remove the patch earlier than 72 hours if you experience unpleasant side effects which may include dry mouth, dizziness or visual disturbances. 3. Avoid touching the patch. Wash your hands with soap and water after contact with the patch.       JP Drain [employer] this sheet to all of your post-operative appointments while you have your drains. Please measure your drains by CC's or ML's. Make sure you drain and measure your JP Drains 2 or 3 times per day. At the end of each day, add up totals for the left side and add up totals for the right side.    ( 9 am )     ( 3 pm )        ( 9 pm )                Date L  R  L  R  L  R  Total L/R                                                                                                                                                                                           JP Drain Totals Bring this sheet to all of your post-operative appointments while you have your drains. Please measure your drains by CC's or ML's. Make sure you drain and measure your JP Drains 2 or 3 times per day. At the end of each day, add up totals  for the left side and add up totals for the right side.    ( 9 am )     ( 3 pm )        ( 9 pm )                Date L  R  L  R  L  R  Total L/R                                                                                                                                                                                      INSTRUCTIONS FOR AFTER BREAST SURGERY   You will likely have some questions about what to expect following your operation.  The following information will help you and your family understand what to expect when you are discharged from the hospital.  It is important to follow these guidelines to help ensure a smooth recovery and reduce complication.  Postoperative instructions include information on: diet, wound care, medications and physical activity.  AFTER SURGERY Expect to go home after the procedure.  In some cases, you may need to spend one night in the hospital for observation.  DIET Breast surgery does not require a specific diet.  However, the healthier you eat the better your body will heal. It is important to increasing your protein intake.  This means limiting the foods with sugar and carbohydrates.  Focus on vegetables and some meat.  If you have liposuction during your procedure be sure to drink water.  If your urine is bright yellow, then it is concentrated, and you need to drink more water.  As a general rule after surgery, you should have 8 ounces of water every hour while awake.  If you find you are persistently nauseated or unable to take in liquids let  us know.  NO TOBACCO USE or EXPOSURE.  This will slow your healing process and lead to a wound.  WOUND CARE Leave the binder on for 3 days . Use fragrance free soap like Dial, Dove or [place].   After 3 days you can remove the binder to shower. Once dry apply binder or sports bra. If you have liposuction you will have a soft and spongy dressing (Lipofoam) that helps prevent creases in your skin.  Remove before you shower and then replace it.  It is also available on [employer]. If you have steri-strips / tape directly attached to your skin leave them in place. It is OK to get these wet.   No baths, pools or hot tubs for four weeks. We close your incision to leave the smallest and best-looking scar. No ointment or  creams on your incisions for four weeks.  No Neosporin (Too many skin reactions).  A few weeks after surgery you can use Mederma and start massaging the scar. We ask you to wear your binder or sports bra for the first 6 weeks around the clock, including while sleeping. This provides added comfort and helps reduce the fluid accumulation at the surgery site. NO Ice or heating pads to the operative site.  You have a very high risk of a BURN before you feel the temperature change.  ACTIVITY No heavy lifting until cleared by the doctor.  This usually means no more than a half-gallon of milk.  It is OK to walk and climb stairs. Moving your legs is very important to decrease your risk of a blood clot.  It will also help keep you from getting deconditioned.  Every 1 to 2 hours get up and walk for 5 minutes. This will help with a quicker recovery back to normal.  Let pain be your guide so you don't do too much.  This time is for you to recover.  You will be more comfortable if you sleep and rest with your head elevated either with a few pillows under you or in a recliner.  No stomach sleeping for a three months.  WORK Everyone returns to work at different times. As a rough guide, most people take at least  1 - 2 weeks off prior to returning to work. If you need documentation for your job, give the forms to the front staff at the clinic.  DRIVING Arrange for someone to bring you home from the hospital after your surgery.  You may be able to drive a few days after surgery but not while taking any narcotics or valium.  BOWEL MOVEMENTS Constipation can occur after anesthesia and while taking pain medication.  It is important to stay ahead for your comfort.  We recommend taking Milk of Magnesia (2 tablespoons; twice a day) while taking the pain pills.  MEDICATIONS You may be prescribed should start after surgery At your preoperative visit for you history and physical you may have been given the following medications: An antibiotic: Start this medication when you get home and take according to the instructions on the bottle. Zofran 4 mg:  This is to treat nausea and vomiting.  You can take this every 6 hours as needed and only if needed. Valium 2 mg for breast cancer patients: This is for muscle tightness if you have an implant or expander. This will help relax your muscle which also helps with pain control.  This can be taken every 12 hours as needed. Don't drive after taking this medication. Norco (hydrocodone/acetaminophen) 5/325 mg:  This is only to be used after you have taken the Motrin or the Tylenol. Every 8 hours as needed.   Over the counter Medication to take: Ibuprofen (Motrin) 600 mg:  Take this every 6 hours.  If you have additional pain then take 500 mg of the Tylenol every 8 hours.  Only take the Norco after you have tried these two. MiraLAX or Milk of Magnesia: Take this according to the bottle if you take the Norco.  WHEN TO CALL Call your surgeon's office if any of the following occur: Fever 101 degrees F or greater Excessive bleeding or fluid from the incision site. Pain that increases over time without aid from the medications Redness, warmth, or pus draining from incision  sites Persistent nausea or inability to take in liquids Severe  misshapen area that underwent the operation.  Here are some resources for breast cancer patients:  Plastic surgery website: https://www.plasticsurgery.org/for-medical-professionals/education-and-resources/publications/breast-reconstruction-magazine Breast Reconstruction Awareness Campaign:  ChessContest[employer]fr Plastic surgery Implant information:  https://www.plasticsurgery.org/patient-safety/breast-implant-safety

## 2022-06-24 NOTE — Interval H&P Note (Signed)
History and Physical Interval Note:  06/24/2022 11:25 AM  Diane Hodges  has presented today for surgery, with the diagnosis of LEFT BREAST CANCER.  The various methods of treatment have been discussed with the patient and family. After consideration of risks, benefits and other options for treatment, the patient has consented to  Procedure(s): LEFT MASTECTOMY WITH SENTINEL LYMPH NODE BIOPSY (Left) RADIOACTIVE SEED GUIDED LEFT AXILLARY SENTINEL LYMPH NODE DISSECTION (Left) IMMEDIATE LEFT BREAST RECONSTRUCTION WITH PLACEMENT OF TISSUE EXPANDER AND FLEX HD (ACELLULAR HYDRATED DERMIS) (Left) as a surgical intervention.  The patient's history has been reviewed, patient examined, no change in status, stable for surgery.  I have reviewed the patient's chart and labs.  Questions were answered to the patient's satisfaction.     [name]

## 2022-06-24 NOTE — Progress Notes (Signed)
Assisted Dr. Woodrum with left, pectoralis, ultrasound guided block. Side rails up, monitors on throughout procedure. See vital signs in flow sheet. Tolerated Procedure well. 

## 2022-06-25 ENCOUNTER — Encounter (HOSPITAL_BASED_OUTPATIENT_CLINIC_OR_DEPARTMENT_OTHER): Payer: Self-pay | Admitting: General Surgery

## 2022-06-25 DIAGNOSIS — Z7984 Long term (current) use of oral hypoglycemic drugs: Secondary | ICD-10-CM | POA: Diagnosis not present

## 2022-06-25 DIAGNOSIS — Z79899 Other long term (current) drug therapy: Secondary | ICD-10-CM | POA: Diagnosis not present

## 2022-06-25 DIAGNOSIS — I10 Essential (primary) hypertension: Secondary | ICD-10-CM | POA: Diagnosis not present

## 2022-06-25 DIAGNOSIS — C50812 Malignant neoplasm of overlapping sites of left female breast: Secondary | ICD-10-CM | POA: Diagnosis not present

## 2022-06-25 DIAGNOSIS — E039 Hypothyroidism, unspecified: Secondary | ICD-10-CM | POA: Diagnosis not present

## 2022-06-25 DIAGNOSIS — Z170 Estrogen receptor positive status [ER+]: Secondary | ICD-10-CM | POA: Diagnosis not present

## 2022-06-25 LAB — GLUCOSE, CAPILLARY: Glucose-Capillary: 151 mg/dL — ABNORMAL HIGH (ref 70–99)

## 2022-06-25 NOTE — Anesthesia Postprocedure Evaluation (Signed)
Anesthesia Post Note  Patient: Moonyeen Langell Gut  Procedure(s) Performed: LEFT MASTECTOMY WITH SENTINEL LYMPH NODE BIOPSY (Left: Breast) RADIOACTIVE SEED GUIDED LEFT AXILLARY SENTINEL LYMPH NODE DISSECTION (Left) IMMEDIATE LEFT BREAST RECONSTRUCTION WITH PLACEMENT OF TISSUE EXPANDER AND FLEX HD (ACELLULAR HYDRATED DERMIS) (Left: Breast)     Patient location during evaluation: PACU Anesthesia Type: Regional and General Level of consciousness: awake and alert Pain management: pain level controlled Vital Signs Assessment: post-procedure vital signs reviewed and stable Respiratory status: spontaneous breathing, nonlabored ventilation, respiratory function stable and patient connected to nasal cannula oxygen Cardiovascular status: blood pressure returned to baseline and stable Postop Assessment: no apparent nausea or vomiting Anesthetic complications: no  No notable events documented.  Last Vitals:  Vitals:   06/25/22 0830 06/25/22 0945  BP:    Pulse: 92   Resp:    Temp:    SpO2: 97% 98%    Last Pain:  Vitals:   06/25/22 0830  TempSrc:   PainSc: 2                  Daylani Deblois L Keiosha Cancro

## 2022-06-25 NOTE — Discharge Summary (Signed)
Physician Discharge Summary  Patient ID: Londa Peron MRN: [id number] DOB/AGE: 05-10-1955 67 y.o.  Admit date: 06/24/2022 Discharge date: 06/25/2022  Admission Diagnoses:  Discharge Diagnoses:  Principal Problem:   Breast cancer [facility])   Discharged Condition: good  Hospital Course: 1 YOF with a history of breast cancer who underwent immediate left breast reduction with placement of tissue expander on 06/24/22 with Dr. Marla Roe in conjunction with Dr. Marlou Starks. The patient reports she did well overnight with no significant complaints. Pain well managed. Some increase drain output last night which slowed this morning.   Consults: none  Significant Diagnostic Studies: none  Treatments:   Discharge Exam: Blood pressure 120/68, pulse 92, temperature 98 F (36.7 C), resp. rate 16, height 5\' 6"  (1.676 m), weight 91.6 kg, SpO2 98 %.  Pt resting comfortably in exam bed. Left breast incision CDI, breast flap viable, no fluid collections, no redness, drains with minimal bloody ouput.   Disposition: Discharge disposition: 01-Home or Self Care       All the patients questions were answered. She and family agree she is ready for DC home. She was given strict return precautions. No further questions or concerns.    Discharge Instructions     Call MD for:  difficulty breathing, headache or visual disturbances   Complete by: As directed    Call MD for:  extreme fatigue   Complete by: As directed    Call MD for:  hives   Complete by: As directed    Call MD for:  persistant dizziness or light-headedness   Complete by: As directed    Call MD for:  persistant nausea and vomiting   Complete by: As directed    Call MD for:  redness, tenderness, or signs of infection (pain, swelling, redness, odor or green/yellow discharge around incision site)   Complete by: As directed    Call MD for:  severe uncontrolled pain   Complete by: As directed    Call MD for:  temperature >100.4    Complete by: As directed    Diet general   Complete by: As directed    Increase activity slowly   Complete by: As directed       Allergies as of 06/25/2022       Reactions   Azithromycin    REACTION: rash   Other Other (See Comments)   Adhesive Strips causes blisters        Medication List     TAKE these medications    blood glucose meter kit and supplies Kit Dispense based on patient and insurance preference. Use up to four times daily as directed. (FOR ICD-9 250.00, 250.01).   carvedilol 6.25 MG tablet Commonly known as: COREG Take 1 tablet (6.25 mg total) by mouth 2 (two) times daily with a meal.   diazepam 2 MG tablet Commonly known as: Valium Take 1 tablet (2 mg total) by mouth every 12 (twelve) hours as needed for up to 20 doses for muscle spasms.   fenofibrate 54 MG tablet TAKE 1 TABLET BY MOUTH EVERY DAY   Fish Oil 1200 MG Caps Take 1 capsule by mouth daily.   glucose blood test strip Onetouch verio test strips. Check blood sugar once a day   levothyroxine 112 MCG tablet Commonly known as: SYNTHROID TAKE 1 TABLET BY MOUTH DAILY BEFORE BREAKFAST.   metFORMIN 500 MG tablet Commonly known as: GLUCOPHAGE TAKE 1 TABLET BY MOUTH TWICE A DAY WITH FOOD   Multivitamin Adult Tabs Take by  mouth.   ondansetron 4 MG tablet Commonly known as: Zofran Take 1 tablet (4 mg total) by mouth every 8 (eight) hours as needed for up to 20 doses for nausea or vomiting.   OneTouch Delica Lancets Fine Misc 1 Device by Does not apply route daily.   PROTEIN PO Take by mouth.   rosuvastatin 10 MG tablet Commonly known as: CRESTOR Take 1 tablet (10 mg total) by mouth daily.   Semaglutide(0.25 or 0.5MG /DOS) 2 MG/3ML Sopn Inject 0.25 mg into the skin once a week. Increase to 0.5mg  in one month.   traMADol 50 MG tablet Commonly known as: Ultram Take 1 tablet (50 mg total) by mouth every 8 (eight) hours as needed for up to 20 doses for moderate pain or severe pain.    triamcinolone cream 0.1 % Commonly known as: KENALOG APPLY TO AFFECTED AREA EVERY DAY   VITAMIN C (CALCIUM ASCORBATE) PO Take by mouth.   VITAMIN D (CHOLECALCIFEROL) PO Take by mouth.   vitamin E 180 MG (400 UNITS) capsule Take 400 Units by mouth daily.        Follow-up Information     Autumn Messing III, MD Follow up in 2 week(s).   Specialty: General Surgery Contact information: [address] [place] [id number] [facility] [address] [place], [place] [address] [phone]  Signed: Stevie Kern Jermya Dowding 06/25/2022, 10:27 AM

## 2022-06-25 NOTE — Progress Notes (Signed)
1 Day Post-Op   Subjective/Chief Complaint: Complains of soreness at drain site   Objective: Vital signs in last 24 hours: Temp:  [97.3 F (36.3 C)-98.6 F (37 C)] 98 F (36.7 C) (03/26 0730) Pulse Rate:  [78-110] 88 (03/26 0730) Resp:  [11-24] 16 (03/26 0730) BP: (119-181)/(58-97) 120/68 (03/26 0730) SpO2:  [89 %-100 %] 96 % (03/26 0730) Weight:  [91.6 kg] 91.6 kg (03/25 1025)    Intake/Output from previous day: 03/25 0701 - 03/26 0700 In: 2050 [P.O.:960; I.V.:1090] Out: 3305 [Urine:2750; Drains:455; Blood:100] Intake/Output this shift: Total I/O In: 240 [P.O.:240] Out: 240 [Urine:200; Drains:40]  General appearance: alert and cooperative Resp: clear to auscultation bilaterally Chest wall: skin flaps look good Cardio: regular rate and rhythm GI: soft, non-tender; bowel sounds normal; no masses,  no organomegaly  Lab Results:  No results for input(s): "WBC", "HGB", "HCT", "PLT" in the last 72 hours. BMET No results for input(s): "NA", "K", "CL", "CO2", "GLUCOSE", "BUN", "CREATININE", "CALCIUM" in the last 72 hours. PT/INR No results for input(s): "LABPROT", "INR" in the last 72 hours. ABG No results for input(s): "PHART", "HCO3" in the last 72 hours.  Invalid input(s): "PCO2", "PO2"  Studies/Results: MM Breast Surgical Specimen  Result Date: 06/24/2022 CLINICAL DATA:  Patient for targeted left axillary dissection. EXAM: SPECIMEN RADIOGRAPH OF THE LEFT BREAST COMPARISON:  Previous exam(s). FINDINGS: Status post excision of the left breast. The radioactive seed and biopsy marker clip are present and completely intact. IMPRESSION: Specimen radiograph of the left breast. Electronically Signed   By: Lovey Newcomer M.D.   On: 06/24/2022 13:41  NM Sentinel Node Inj-No Rpt (Breast)  Result Date: 06/24/2022 Sulfur Colloid was injected by the Nuclear Medicine Technologist for sentinel lymph node localization.    Anti-infectives: Anti-infectives (From admission, onward)     Start     Dose/Rate Route Frequency Ordered Stop   06/24/22 1600  ceFAZolin (ANCEF) IVPB 2g/100 mL premix        2 g 200 mL/hr over 30 Minutes Intravenous Every 8 hours 06/24/22 1551 07/01/22 1359   06/24/22 1402  ceFAZolin 1 g / gentamicin 80 mg in NS 500 mL surgical irrigation  Status:  Discontinued          As needed 06/24/22 1432 06/24/22 1432   06/24/22 1000  ceFAZolin (ANCEF) IVPB 2g/100 mL premix        2 g 200 mL/hr over 30 Minutes Intravenous On call to O.R. 06/24/22 0954 06/25/22 0559   06/24/22 1000  ceFAZolin (ANCEF) IVPB 2g/100 mL premix        2 g 200 mL/hr over 30 Minutes Intravenous On call to O.R. 06/24/22 0954 06/25/22 0559   06/24/22 1000  ceFAZolin (ANCEF) IVPB 2g/100 mL premix        2 g 200 mL/hr over 30 Minutes Intravenous On call to O.R. 06/24/22 0954 06/24/22 1224       Assessment/Plan: s/p Procedure(s): LEFT MASTECTOMY WITH SENTINEL LYMPH NODE BIOPSY (Left) RADIOACTIVE SEED GUIDED LEFT AXILLARY SENTINEL LYMPH NODE DISSECTION (Left) IMMEDIATE LEFT BREAST RECONSTRUCTION WITH PLACEMENT OF TISSUE EXPANDER AND FLEX HD (ACELLULAR HYDRATED DERMIS) (Left) Advance diet Discharge  LOS: 0 days    Diane Hodges 06/25/2022

## 2022-06-25 NOTE — Op Note (Addendum)
Op report    DATE OF OPERATION:  06/24/2022  LOCATION: [place]  SURGICAL DIVISION: Plastic Surgery  PREOPERATIVE DIAGNOSES:  1. Left Breast cancer.    POSTOPERATIVE DIAGNOSES:  1. Left Breast cancer.   PROCEDURE:  1. Left immediate breast reconstruction with placement of Acellular Dermal Matrix and tissue expanders.  SURGEON: Evren Shankland Sanger Briele Lagasse, DO  ASSISTANT: Roetta Sessions, PA  ANESTHESIA:  General.   COMPLICATIONS: None.   IMPLANTS: Left - Mentor 535 cc. Ref #[id number].  250 cc of injectable saline placed in the expander. Acellular Dermal Matrix Flex HD 8 x 16 cm  INDICATIONS FOR PROCEDURE:  The patient, Diane Hodges, is a 67 y.o. female born on 1955/07/05, is here for  immediate first stage breast reconstruction with placement of a left tissue expander and Acellular dermal matrix. MRN: [id number]  CONSENT:  Informed consent was obtained directly from the patient. Risks, benefits and alternatives were fully discussed. Specific risks including but not limited to bleeding, infection, hematoma, seroma, scarring, pain, implant infection, implant extrusion, capsular contracture, asymmetry, wound healing problems, and need for further surgery were all discussed. The patient did have an ample opportunity to have her questions answered to her satisfaction.   DESCRIPTION OF PROCEDURE:  The patient was taken to the operating room by the general surgery team. SCDs were placed and IV antibiotics were given. The patient's chest was prepped and draped in a sterile fashion. A time out was performed and the implants to be used were identified.  A left mastectomy was performed.  Once the general surgery team had completed their portion of the case the patient was rendered to the plastic and reconstructive surgery team.  Left:  The pectoralis major muscle was lifted from the chest wall with release of the lateral edge and lateral inframammary fold.  The pocket  was irrigated with antibiotic solution and hemostasis was achieved with electrocautery.  The ADM was then prepared according to the manufacture guidelines and slits placed to help with postoperative fluid management.  The ADM was then sutured to the inferior and lateral edge of the inframammary fold with 2-0 PDS starting with an interrupted stitch and then a running stitch.  The lateral portion was sutured to with interrupted sutures after the expander was placed.  The expander was prepared according to the manufacture guidelines, the air evacuated and then it was placed under the ADM and pectoralis major muscle.  The inferior and lateral tabs were used to secure the expander to the chest wall with 2-0 PDS.  The drain was placed at the inframammary fold over the ADM and secured to the skin with 3-0 Silk.  The deep layers were closed with 3-0 PDS followed by 3-0 Monocryl.  The skin was closed with 5-0 Monocryl and then dermabond was applied.  The ABDs and breast binder were placed.  The patient tolerated the procedure well and there were no complications.  The patient was allowed to wake from anesthesia and taken to the recovery room in satisfactory condition.   The advanced practice practitioner (APP) assisted throughout the case.  The APP was essential in retraction and counter traction when needed to make the case progress smoothly.  This retraction and assistance made it possible to see the tissue plans for the procedure.  The assistance was needed for blood control, tissue re-approximation and assisted with closure of the incision site.

## 2022-06-26 ENCOUNTER — Ambulatory Visit (INDEPENDENT_AMBULATORY_CARE_PROVIDER_SITE_OTHER): Payer: Medicare Other | Admitting: Physician Assistant

## 2022-06-26 DIAGNOSIS — C50812 Malignant neoplasm of overlapping sites of left female breast: Secondary | ICD-10-CM

## 2022-06-26 DIAGNOSIS — Z170 Estrogen receptor positive status [ER+]: Secondary | ICD-10-CM

## 2022-06-26 DIAGNOSIS — C50912 Malignant neoplasm of unspecified site of left female breast: Secondary | ICD-10-CM | POA: Diagnosis not present

## 2022-06-26 NOTE — Telephone Encounter (Signed)
LOA request received [id number] not applicable.  Patient has been awaiting surgical date.  Noted surgery on 06/24/2022.  [facility] follow up and treatment plan pending further studies.  Awaiting new request.

## 2022-06-26 NOTE — Progress Notes (Signed)
This is a 67 year old female who is postop left mastectomy with immediate left breast reconstruction with placement of tissue expander and Flex HD by Dr. Marla Roe on 06/24/2022. The patient is located in [place], I am currently at our office in [place].  The patient notes she is doing very well, she denies any significant pain, she notes some soreness of the left chest.  She notes approximately 60 cc of drainage over the last 24 hours.  No other concerns today.  Exam:  Phone encounter, patient speaking in full sentences no acute distress.  Overall the patient is doing very well from a postoperative standpoint, she has a scheduled follow-up on 07/02/2022 with Dr. Marla Roe.  She was given strict return precautions, she verbalized understanding and agreement to today's plan had no further questions or concerns.

## 2022-06-27 ENCOUNTER — Encounter: Payer: Self-pay | Admitting: *Deleted

## 2022-06-27 LAB — SURGICAL PATHOLOGY

## 2022-07-02 ENCOUNTER — Encounter: Payer: Self-pay | Admitting: Plastic Surgery

## 2022-07-02 ENCOUNTER — Ambulatory Visit (INDEPENDENT_AMBULATORY_CARE_PROVIDER_SITE_OTHER): Payer: Medicare Other | Admitting: Plastic Surgery

## 2022-07-02 DIAGNOSIS — Z170 Estrogen receptor positive status [ER+]: Secondary | ICD-10-CM

## 2022-07-02 DIAGNOSIS — C50812 Malignant neoplasm of overlapping sites of left female breast: Secondary | ICD-10-CM

## 2022-07-02 NOTE — Progress Notes (Signed)
   Subjective:    Patient ID: Diane Hodges, female    DOB: 1955-10-17, 67 y.o.   MRN: [id number]  The patient is a 67 year old female here with her husband for follow-up after undergoing a left mastectomy with reconstruction on March 26.  The patient was diagnosed with grade 1 invasive lobular carcinoma in February.  She is 5 feet 5 inches tall and weighs 204 pounds.  Her preoperative bra size is a B/C cup.  The lesion was estrogen and progesterone positive and HER2 negative.  She had 250 cc of injectable saline put in the expander on the day of surgery.  She has a little bit of loculation today which I was able to break up and encouraged to come out of the drain.  She is doing great with her compression.      Review of Systems  Constitutional: Negative.   Eyes: Negative.   Respiratory: Negative.         Objective:   Physical Exam Constitutional:      Appearance: Normal appearance.  Cardiovascular:     Rate and Rhythm: Normal rate.     Pulses: Normal pulses.  Neurological:     Mental Status: She is alert and oriented to person, place, and time.  Psychiatric:        Mood and Affect: Mood normal.        Behavior: Behavior normal.        Thought Content: Thought content normal.        Judgment: Judgment normal.        Assessment & Plan:     ICD-10-CM   1. Malignant neoplasm of overlapping sites of left breast in female, estrogen receptor positive  C50.812    Z17.0         Pictures were obtained of the patient and placed in the chart with the patient's or guardian's permission.  We placed injectable saline in the Expander using a sterile technique: Left: 50 cc for a total of 300 / 535 cc   Will plan to take the drain out next week.

## 2022-07-03 ENCOUNTER — Encounter: Payer: Self-pay | Admitting: *Deleted

## 2022-07-03 ENCOUNTER — Telehealth: Payer: Self-pay | Admitting: *Deleted

## 2022-07-03 NOTE — Telephone Encounter (Signed)
Ordered oncotype per Dr. Burr Medico.  Sent requisition to pathology and exact sciences.

## 2022-07-04 ENCOUNTER — Encounter (HOSPITAL_COMMUNITY): Payer: Self-pay

## 2022-07-05 ENCOUNTER — Encounter: Payer: Self-pay | Admitting: Plastic Surgery

## 2022-07-09 ENCOUNTER — Ambulatory Visit (INDEPENDENT_AMBULATORY_CARE_PROVIDER_SITE_OTHER): Payer: Medicare Other | Admitting: Physician Assistant

## 2022-07-09 DIAGNOSIS — Z170 Estrogen receptor positive status [ER+]: Secondary | ICD-10-CM

## 2022-07-09 DIAGNOSIS — C50812 Malignant neoplasm of overlapping sites of left female breast: Secondary | ICD-10-CM

## 2022-07-09 NOTE — Progress Notes (Signed)
This is a 67 year old female seen in our office for postop follow-up status post left mastectomy with immediate left breast reconstruction with placement of tissue expander and Flex HD by Dr. Ulice Bold on 06/24/2022.  The patient was last seen in the office on 07/02/2022 by Dr. Ulice Bold.  At that time she had been doing well.  She had 50 cc of injectable saline placed in to the tissue expander totaling 300/535 cc.  Since her last office visit she notes that she has been doing well.  She notes some tightness along the left chest after the last expansion but denies any significant pain, she is not taking any pain medication at this time.  She denies any other acute concerns at today's visit.  Chaperone present.  On exam left expander in place, incisions clean dry and intact, no overlying redness, no palpable fluid collections.  Mastectomy flaps are viable.  JP drain with dark serosanguineous output.   Injectable saline was placed into left tissue expander    Left: 50 cc for a total of 350/535 cc  The patient tolerated this well without any issues   Overall the patient is doing well.  I did provide another fill today.  The patient's JP drain has continued to put out greater than 50 cc/day over the last several days.  It has a dark serosanguineous output.  I discussed that given the increased volume output we should leave the drain in until her volume decreases.  I have instructed her to follow-up in 1 week but if the drain output decreases they will call the office and schedule an earlier appointment.  Otherwise she is doing well with no acute issues today.  She was given strict return precautions.  Both the patient and her husband verbalized understanding and agreement to today's plan.

## 2022-07-12 ENCOUNTER — Other Ambulatory Visit: Payer: Self-pay

## 2022-07-12 ENCOUNTER — Encounter: Payer: Self-pay | Admitting: Hematology

## 2022-07-12 DIAGNOSIS — C50812 Malignant neoplasm of overlapping sites of left female breast: Secondary | ICD-10-CM | POA: Diagnosis not present

## 2022-07-12 DIAGNOSIS — Z170 Estrogen receptor positive status [ER+]: Secondary | ICD-10-CM | POA: Diagnosis not present

## 2022-07-15 ENCOUNTER — Telehealth: Payer: Self-pay | Admitting: Hematology

## 2022-07-15 ENCOUNTER — Encounter: Payer: Self-pay | Admitting: Hematology

## 2022-07-15 NOTE — Telephone Encounter (Signed)
I called pt and discussed her Oncotype results. It showed RS 2 with the estimates risk of distant recurrence 9% in the next 9 years.  There is no benefit of adjuvant chemotherapy.  I recommend her to proceed with adjuvant radiation next, and I will see her at the end of radiation to start her on adjuvant antiestrogen therapy.  All questions were answered.  I spent 10 minutes on the phone with her and her husband.  Diane Hodges  07/15/2022

## 2022-07-16 ENCOUNTER — Ambulatory Visit (INDEPENDENT_AMBULATORY_CARE_PROVIDER_SITE_OTHER): Payer: Medicare Other | Admitting: Physician Assistant

## 2022-07-16 ENCOUNTER — Encounter: Payer: Self-pay | Admitting: *Deleted

## 2022-07-16 ENCOUNTER — Telehealth: Payer: Self-pay | Admitting: *Deleted

## 2022-07-16 DIAGNOSIS — Z170 Estrogen receptor positive status [ER+]: Secondary | ICD-10-CM

## 2022-07-16 DIAGNOSIS — C50812 Malignant neoplasm of overlapping sites of left female breast: Secondary | ICD-10-CM

## 2022-07-16 NOTE — Progress Notes (Signed)
This is a 67 year old female seen in our office for postop follow-up status post left mastectomy with immediate left breast reconstruction with placement of tissue expander and Flex HD by Dr. Ulice Bold on 06/24/2022.  The patient was last seen in the office on 07/09/2022.  At that time she had been doing well, she had 50 cc of injectable saline placed into her expander totaling 350/535.  This visit she notes she has been doing well.  She notes some minimal discomfort after her last injection which has been typical.  She denies any infectious signs or symptoms.  She notes that her JP drain has turned more clear, and has put out approximately 40 cc over the last 24 hours.  She notes that she has seen her oncologist and is being referred to radiation oncology but has no further information at this time.  Chaperone present.  On exam left expander in place, incisions clean dry and intact, no overlying redness, no palpable fluid collections.  Mastectomy flaps are viable, JP drain with serous output.   Injectable saline was placed into left tissue expander.  Left: 50 cc for total of 400/535 cc  Overall the patient is doing well.  Her drain continues to put out greater than 30 cc of serous fluid.  I discussed leaving the drain a little longer, the patient is very anxious to get the drain removed.  I did provide another fill, I find it reasonable for the patient to follow-up in our office Friday before the weekend if the drain is putting out less than 30 cc we may consider removal at that time.  The patient will follow-up on Friday for repeat evaluation or sooner as needed.  She verbalized understanding and agreement to today's plan.

## 2022-07-16 NOTE — Progress Notes (Signed)
Location of Breast Cancer: Malignant neoplasm of overlapping sites of left breast in female, estrogen receptor positive   Histology per Pathology Report:  06-24-22 FINAL MICROSCOPIC DIAGNOSIS: A. BREAST, LEFT, MASTECTOMY:      Invasive lobular carcinoma, classic type      Histological grade: Grade 2 (Tubular formation: 3, nuclear grade: 2, mitotic rate: 1) Tumor size: At least 13.1 cm in maximal dimension Carcinoma extends to the random sections in upper and lower outer quadrants Ductal carcinoma in situ: Not identified Margins, invasive:  Negative            Closest, invasive: Deep Margins, DCIS:  NA           Closest, DCIS:  NA Lymphovascular invasion:  Identified Prognostic markers:  Performed on prior biopsy [id number] ER: Positive, 100%, strong PR: Positive, 100%, strong Her2: Negative, 1+ Ki-67: 2% Other:  Atypical lobular hyperplasia [facility]) See oncology table  B. LYMPH NODE, LEFT AXILLARY, TARGETED, SENTINEL, EXCISION:      One lymph node, positive for metastatic carcinoma (1/1).      Metastatic focus:  1.6 cm in maximal dimension.      No extranodal extension identified.  C. LYMPH NODE, LEFT AXILLARY #1, SENTINEL, EXCISION:      One lymph node, negative for metastatic carcinoma (0/1).  D. LYMPH NODE, LEFT AXILLARY #2, SENTINEL, EXCISION:      One lymph node, negative for metastatic carcinoma (0/1).  E. LYMPH NODE, LEFT AXILLARY, TISSUE:      One lymph node, negative for metastatic carcinoma (0/1).  COMMENT: The targeted left axillary lymph node (part B) is positive for metastatic lobular carcinoma which was reported by the prior biopsy [id number]. Immunohistochemical stains for cytokeratin AE1/AE3 were performed on C,D and E. There is no evidence of metastatic carcinoma on C, D and E histologically and immunochemically. Controls worked appropriately.  Four biopsy clips are identified in the left mastectomy specimen. Samplings from tissue surrounding the clips  and between Barbell-Ribbon, Ribbon-Coil and Coil-Cylinder contain carcinoma as well as random samplings from the upper outer quadrant and lower outer quadrant. Therefore, the tumor size is calculated as at least 13.1 cm.   Receptor Status: ER(100%), PR (100%), Her2-neu (neg), Ki-67(2%)  Did patient present with symptoms (if so, please note symptoms) or was this found on screening mammography? Routine mammogram  Past/Anticipated interventions by surgeon, if any: 06-24-22 PROCEDURE:  Procedure(s): LEFT MASTECTOMY WITH DEEP LEFT AXILLARY SENTINEL LYMPH NODE BIOPSY (Left) RADIOACTIVE SEED GUIDED DEEP LEFT AXILLARY TARGETED LYMPH NODE DISSECTION    SURGEON:  Surgeon(s) and Role: Panel 1:    Griselda Miner, MD - Primary   06-25-22 PROCEDURE:  1. Left immediate breast reconstruction with placement of Acellular Dermal Matrix and tissue expanders.   SURGEON: Claire Sanger Dillingham, DO    Past/Anticipated interventions by medical oncology, if any:  06-07-22 Dr. Mosetta Putt ASSESSMENT & PLAN:  Malignant neoplasm of overlapping sites of left breast in female, estrogen receptor positive (HCC) invasive lobular carcinoma, cT3N1M0, stage IIA, ER+/PR+/HER2-, G1  -diagnosed in 04/2022 -Given the lobular histology, she underwent breast MRI, which showed 6.1 cm mass in the central of left breast, with 1 abnormal axillary lymph node. -Staging bone scan showed abnormal uptake in L2 and L3, we obtained spinal MRI to evaluate the bone metastasis which was negative. -CT chest abdomen pelvis with contrast was negative for metastatic disease. -I reviewed the above imaging findings with patient in detail.  All questions were answered -She is scheduled to have left  breast mastectomy and Lymph node dissection on June 24, 2022. -If she has 3 or less positive lymph nodes on surgical path, will obtain Oncotype Dx to see if she needs adjuvant chemotherapy.  However if she has 4 or more positive lymph nodes, then I will  recommend adjuvant chemotherapy due to high risk of recurrence.  She voiced good understanding and agrees with the plan. -I will see her after surgery.   Plan -we reviewed her CT, MRI images and discussed the findings  -she will proceed with surgery on 3/25 -will obtain Oncotype on surgical sample if <=3 positive nodes -will see her after surgery or radiation, depends on if she needs chemo   Lymphedema issues, if any:   was evaluated yesterday by PT, reports small area of swelling in shoulder but otherwise denies any concerns  Pain issues, if any:  Reports tenderness to surgical sites, espciaslly where lymph nodes nodes were removed  SAFETY ISSUES: Prior radiation? No Pacemaker/ICD? No Possible current pregnancy? No-0-postmenopusaal  Is the patient on methotrexate? No  Current Complaints / other details:  Reports she's scheduled to see plastic surgeon again tomorrow

## 2022-07-16 NOTE — Telephone Encounter (Signed)
Received oncotype results 2/9%. Patient aware. Referral placed for Dr. Basilio Cairo

## 2022-07-18 NOTE — Progress Notes (Signed)
Patient is a 67 year old female here for follow-up on her left breast reconstruction.  She had a left mastectomy with immediate breast reconstruction placement tissue expander and Flex HD with Dr. Ulice Bold on 06/24/2022.  She is just shy of 4 weeks postop.  She was last seen in the office on 07/16/2022, 2 days ago.  She had 50 cc of fluid placed in the expander at that time.  She now has a total of 400/535 cc. She is here for evaluation of the JP drain for possible removal.  At her last appointment she was still continued to have greater than 30 cc of output.  She presents today with her husband, they are both very pleasant.  She reports that the output has been approximately 40 cc of drainage over the past 24 hours.  The drainage continues to be serous in color.  She does note today that she had 4 lymph nodes removed intraoperatively during her mastectomy.  She is feeling well, has not noticed any infectious symptoms or changes to her breast.  They do report that they have been washing the bulb out with water as it has been collecting some tissue in the bulb.  Chaperone present on exam On exam left breast incision is intact and healing very well, I do not appreciate any subcutaneous fluid collections with palpation.  There is no ecchymosis or significant swelling noted.  There is no erythema or cellulitic changes.  Left JP drain is in place with approximately 10 cc of serous drainage in the bulb.  A/P:  We discussed the drainage continues to be a little too high to recommend removal at this time.  We did discuss that at some point we need to decide the benefits versus risks of leaving the drain in place including increased risk of infection due to the drain being in place.  We thoroughly discussed this and at this time feel as if leaving the drain for about 5 more days and reevaluating next week is a good option.  We discussed that if she notices any redness of her breast, redness of the surrounding  JP drain site to please notify our office.   We discussed the significance of developing infection related to breast reconstruction with an expander in place.  All of their questions were answered to their content.  She has an appointment scheduled next week for Wednesday.

## 2022-07-19 ENCOUNTER — Ambulatory Visit (INDEPENDENT_AMBULATORY_CARE_PROVIDER_SITE_OTHER): Payer: Medicare Other | Admitting: Surgical

## 2022-07-19 ENCOUNTER — Encounter: Payer: Self-pay | Admitting: Hematology

## 2022-07-19 DIAGNOSIS — Z9889 Other specified postprocedural states: Secondary | ICD-10-CM

## 2022-07-19 DIAGNOSIS — C50812 Malignant neoplasm of overlapping sites of left female breast: Secondary | ICD-10-CM

## 2022-07-19 DIAGNOSIS — C50112 Malignant neoplasm of central portion of left female breast: Secondary | ICD-10-CM

## 2022-07-22 ENCOUNTER — Encounter: Payer: Self-pay | Admitting: *Deleted

## 2022-07-23 ENCOUNTER — Other Ambulatory Visit: Payer: Self-pay | Admitting: Family Medicine

## 2022-07-23 DIAGNOSIS — E785 Hyperlipidemia, unspecified: Secondary | ICD-10-CM

## 2022-07-24 ENCOUNTER — Ambulatory Visit (INDEPENDENT_AMBULATORY_CARE_PROVIDER_SITE_OTHER): Payer: Medicare Other | Admitting: Physician Assistant

## 2022-07-24 DIAGNOSIS — C50812 Malignant neoplasm of overlapping sites of left female breast: Secondary | ICD-10-CM

## 2022-07-24 DIAGNOSIS — Z170 Estrogen receptor positive status [ER+]: Secondary | ICD-10-CM

## 2022-07-24 NOTE — Therapy (Deleted)
OUTPATIENT PHYSICAL THERAPY BREAST CANCER POST OP FOLLOW UP   Patient Name: Diane Hodges MRN: [id number] DOB:[birth year], 67 y.o., female Today's Date: 07/24/2022  END OF SESSION:   Past Medical History:  Diagnosis Date   Atypical nevus 04/12/2014   mid back -mild   DM (diabetes mellitus), type 2    Hyperlipemia    Hypertension    PONV (postoperative nausea and vomiting)    with one surgery   Thyroid disease    Hypothyridism   Past Surgical History:  Procedure Laterality Date   BREAST BIOPSY Left 04/08/2022   MM LT BREAST BX W LOC DEV EA AD LESION IMG BX SPEC STEREO GUIDE 04/08/2022 [facility]   BREAST BIOPSY Left 04/08/2022   MM LT BREAST BX W LOC DEV 1ST LESION IMAGE BX SPEC STEREO GUIDE 04/08/2022 [facility]   BREAST BIOPSY Left 06/21/2022   [place] LT RADIOACTIVE SEED LOC 06/21/2022 [facility]   BREAST RECONSTRUCTION WITH PLACEMENT OF TISSUE EXPANDER AND FLEX HD (ACELLULAR HYDRATED DERMIS) Left 06/24/2022   Procedure: IMMEDIATE LEFT BREAST RECONSTRUCTION WITH PLACEMENT OF TISSUE EXPANDER AND FLEX HD (ACELLULAR HYDRATED DERMIS);  Surgeon: Peggye Form, DO;  Location: [facility];  Service: Plastics;  Laterality: Left;   CATARACT EXTRACTION Right 2015   MASTECTOMY W/ SENTINEL NODE BIOPSY Left 06/24/2022   Procedure: LEFT MASTECTOMY WITH SENTINEL LYMPH NODE BIOPSY;  Surgeon: Griselda Miner, MD;  Location: [facility];  Service: General;  Laterality: Left;   OOPHORECTOMY  1980   RADIOACTIVE SEED GUIDED AXILLARY SENTINEL LYMPH NODE Left 06/24/2022   Procedure: RADIOACTIVE SEED GUIDED LEFT AXILLARY SENTINEL LYMPH NODE DISSECTION;  Surgeon: Griselda Miner, MD;  Location: [facility];  Service: General;  Laterality: Left;   Patient Active Problem List   Diagnosis Date Noted   Breast cancer 06/24/2022   Carcinoma of central portion of left breast in female, estrogen receptor positive 04/17/2022   Malignant neoplasm of  overlapping sites of left breast in female, estrogen receptor positive 04/16/2022   Preventative health care 07/30/2021   Uncontrolled type 2 diabetes mellitus with hyperglycemia 06/26/2020   Anxiety 02/20/2013   Obesity (BMI 30-39.9) 02/19/2013   Hyperlipidemia 10/16/2009   Essential hypertension 12/26/2008   ALLERGIC RHINITIS 11/12/2007   COMMON MIGRAINE 12/08/2006   SKIN RASH 12/08/2006   Hypothyroidism 10/20/2006   BRONCHITIS, ACUTE 10/20/2006    REFERRING PROVIDER: Dr. Chevis Pretty  REFERRING DIAG: Left breast cancer  THERAPY DIAG:  Malignant neoplasm of overlapping sites of left breast in female, estrogen receptor positive  Abnormal posture  Aftercare following surgery for neoplasm  Rationale for Evaluation and Treatment: Rehabilitation  ONSET DATE: 06/24/2022  SUBJECTIVE:  SUBJECTIVE STATEMENT: Patient underwent a left mastectomy and sentinel node biopsy (1 of 4 nodes positive) on 06/24/2022. She had an expander placed at the time of surgery for reconstruction. Her Oncotype score was 2 so no chemo is needed.  PERTINENT HISTORY:  Patient was diagnosed on 03/05/2022 with left grade 1 invasive lobular carcinoma breast cancer. It is ER/PR positive and HER2 negative with a Ki67 of 2%. She underwent a left mastectomy and sentinel node biopsy (1 of 4 nodes positive) on 06/24/2022.  PATIENT GOALS:  Reassess how my recovery is going related to arm function, pain, and swelling.  PAIN:  Are you having pain? {OPRCPAIN:27236}  PRECAUTIONS: Recent Surgery, left UE Lymphedema risk  ACTIVITY LEVEL / LEISURE: ***   OBJECTIVE:   PATIENT SURVEYS:  QUICK DASH: ***  OBSERVATIONS: ***  POSTURE:  ***  LYMPHEDEMA ASSESSMENT:   UPPER EXTREMITY AROM/PROM:   A/PROM RIGHT   eval    Shoulder  extension 58  Shoulder flexion 158  Shoulder abduction 170  Shoulder internal rotation 62  Shoulder external rotation 80                          (Blank rows = not tested)   A/PROM LEFT   eval LEFT 07/25/2022  Shoulder extension 70   Shoulder flexion 148   Shoulder abduction 165   Shoulder internal rotation 66   Shoulder external rotation 87                           (Blank rows = not tested)   CERVICAL AROM: All within normal limits   UPPER EXTREMITY STRENGTH: WNL   LYMPHEDEMA ASSESSMENTS:    LANDMARK RIGHT   eval RIGHT 07/25/2022  10 cm proximal to olecranon process 37.3   Olecranon process 29   10 cm proximal to ulnar styloid process 25.8   Just proximal to ulnar styloid process 18.6   Across hand at thumb web space 19.2   At base of 2nd digit 6.3   (Blank rows = not tested)   LANDMARK LEFT   eval LEFT 07/25/2022  10 cm proximal to olecranon process 35.2   Olecranon process 27.4   10 cm proximal to ulnar styloid process 24.5   Just proximal to ulnar styloid process 17.5   Across hand at thumb web space 19.4   At base of 2nd digit 6.3   (Blank rows = not tested)  Surgery type/Date: Left mastectomy and sentinel node biopsy 06/24/2022 Number of lymph nodes removed: 4 Current/past treatment (chemo, radiation, hormone therapy): none Other symptoms:  Heaviness/tightness {yes/no:20286} Pain {yes/no:20286} Pitting edema {yes/no:20286} Infections {yes/no:20286} Decreased scar mobility {yes/no:20286} Stemmer sign {yes/no:20286}  PATIENT EDUCATION:  Education details: *** Person educated: {Person educated:25204} Education method: {Education Method:25205} Education comprehension: {Education Comprehension:25206}  HOME EXERCISE PROGRAM: Reviewed previously given post op HEP. ***  ASSESSMENT:  CLINICAL IMPRESSION: ***  Pt will benefit from skilled therapeutic intervention to improve on the following deficits: Decreased knowledge of precautions, impaired UE  functional use, pain, decreased ROM, postural dysfunction.   PT treatment/interventions: ADL/Self care home management, {rehab planned interventions:25118::"Therapeutic exercises","Therapeutic activity","Neuromuscular re-education","Balance training","Gait training","Patient/Family education","Self Care","Joint mobilization"}   GOALS: Goals reviewed with patient? {yes/no:20286}  LONG TERM GOALS:  (STG=LTG)  GOALS Name Target Date  Goal status  1 Pt will demonstrate she has regained full shoulder ROM and function post operatively compared to baselines.  Baseline: 06/12/2022 {GOALSTATUS:25110}  2  *** {  GOALSTATUS:25110}  3  *** {GOALSTATUS:25110}  4  *** {GOALSTATUS:25110}     PLAN:  PT FREQUENCY/DURATION: ***  PLAN FOR NEXT SESSION: ***   [facility]  [address]  [place] [address]  [phone]  After Breast Cancer Class It is recommended you attend the ABC class to be educated on lymphedema risk reduction. This class is free of charge and lasts for 1 hour. It is a 1-time class. You will need to download the TEAMS app either on your phone or computer. We will send you a link the night before or the morning of the class. You should be able to click on that link to join the class. This is not a confidential class. You don't have to turn your camera on, but other participants may be able to see your email address.  Scar massage You can begin gentle scar massage to you incision sites. Gently place one hand on the incision and move the skin (without sliding on the skin) in various directions. Do this for a few minutes and then you can gently massage either coconut oil or vitamin E cream into the scars.  Compression garment You should continue wearing your compression bra until you feel like you no longer have swelling.  Home exercise Program Continue doing the exercises you were given until you feel like you can do them without feeling any  tightness at the end.   Walking Program Studies show that 30 minutes of walking per day (fast enough to elevate your heart rate) can significantly reduce the risk of a cancer recurrence. If you can't walk due to other medical reasons, we encourage you to find another activity you could do (like a stationary bike or water exercise).  Posture After breast cancer surgery, people frequently sit with rounded shoulders posture because it puts their incisions on slack and feels better. If you sit like this and scar tissue forms in that position, you can become very tight and have pain sitting or standing with good posture. Try to be aware of your posture and sit and stand up tall to heal properly.  Follow up PT: It is recommended you return every 3 months for the first 3 years following surgery to be assessed on the SOZO machine for an L-Dex score. This helps prevent clinically significant lymphedema in 95% of patients. These follow up screens are 10 minute appointments that you are not billed for.  Araceli Arango,MARTI COOPER, PT 07/24/2022, 9:11 AM

## 2022-07-24 NOTE — Progress Notes (Signed)
This is a 67 year old female seen in our office for postop follow-up status post left mastectomy with immediate left breast reconstruction with placement of tissue expander and Flex HD by Dr. Ulice Bold on 06/24/2022.   The patient was last seen in the office on 07/19/2022.  At that time she had been doing well, she still had ongoing output from her JP drain they were left in at that time.  Since her last office visit she denies any significant complaints or concerns.  She notes that the drain has significantly slowed down with less than 25 cc over the last 24 hours.   Chaperone present.  On exam left expander in place, incisions clean dry and intact, no overlying redness, no palpable fluid collections.  Mastectomy flaps are viable, JP drain with serous output.    Injectable saline was placed into left tissue expander.  Left: 50 cc for total of 450/535 cc   Overall the patient is doing well.  I did provide in the field today, her drain was removed without difficulty.  The patient does have follow-up next Tuesday with her radiation oncologist.  I anticipate that she may have 1-2 more fills.  She will find out if she needs to have radiation prior to exchange.  I would like to see her back in the office next week for repeat evaluation and fill.  She was given strict return precautions.  Both patient and her husband verbalized understanding and agreement to today's plan.

## 2022-07-25 ENCOUNTER — Ambulatory Visit: Payer: Medicare Other | Admitting: Physical Therapy

## 2022-07-29 ENCOUNTER — Encounter: Payer: Self-pay | Admitting: Physical Therapy

## 2022-07-29 ENCOUNTER — Ambulatory Visit: Payer: Medicare Other | Attending: General Surgery | Admitting: Physical Therapy

## 2022-07-29 DIAGNOSIS — R293 Abnormal posture: Secondary | ICD-10-CM | POA: Diagnosis not present

## 2022-07-29 DIAGNOSIS — C50812 Malignant neoplasm of overlapping sites of left female breast: Secondary | ICD-10-CM | POA: Insufficient documentation

## 2022-07-29 DIAGNOSIS — Z483 Aftercare following surgery for neoplasm: Secondary | ICD-10-CM | POA: Diagnosis not present

## 2022-07-29 DIAGNOSIS — Z170 Estrogen receptor positive status [ER+]: Secondary | ICD-10-CM | POA: Insufficient documentation

## 2022-07-29 NOTE — Therapy (Signed)
OUTPATIENT PHYSICAL THERAPY BREAST CANCER POST OP FOLLOW UP   Patient Name: Diane Hodges MRN: [id number] DOB:October 16, 1955, 67 y.o., female Today's Date: 07/29/2022  END OF SESSION:  PT End of Session - 07/29/22 1412     Visit Number 2    Number of Visits 10    Date for PT Re-Evaluation 08/26/22    PT Start Time 1408    PT Stop Time 1505    PT Time Calculation (min) 57 min    Activity Tolerance Patient tolerated treatment well    Behavior During Therapy [facility] for tasks assessed/performed             Past Medical History:  Diagnosis Date   Atypical nevus 04/12/2014   mid back -mild   DM (diabetes mellitus), type 2 (HCC)    Hyperlipemia    Hypertension    PONV (postoperative nausea and vomiting)    with one surgery   Thyroid disease    Hypothyridism   Past Surgical History:  Procedure Laterality Date   BREAST BIOPSY Left 04/08/2022   MM LT BREAST BX W LOC DEV EA AD LESION IMG BX SPEC STEREO GUIDE 04/08/2022 [facility]   BREAST BIOPSY Left 04/08/2022   MM LT BREAST BX W LOC DEV 1ST LESION IMAGE BX SPEC STEREO GUIDE 04/08/2022 [facility]   BREAST BIOPSY Left 06/21/2022   [place] LT RADIOACTIVE SEED LOC 06/21/2022 [facility]   BREAST RECONSTRUCTION WITH PLACEMENT OF TISSUE EXPANDER AND FLEX HD (ACELLULAR HYDRATED DERMIS) Left 06/24/2022   Procedure: IMMEDIATE LEFT BREAST RECONSTRUCTION WITH PLACEMENT OF TISSUE EXPANDER AND FLEX HD (ACELLULAR HYDRATED DERMIS);  Surgeon: Peggye Form, DO;  Location: [facility];  Service: Plastics;  Laterality: Left;   CATARACT EXTRACTION Right 2015   MASTECTOMY W/ SENTINEL NODE BIOPSY Left 06/24/2022   Procedure: LEFT MASTECTOMY WITH SENTINEL LYMPH NODE BIOPSY;  Surgeon: Griselda Miner, MD;  Location: [facility];  Service: General;  Laterality: Left;   OOPHORECTOMY  1980   RADIOACTIVE SEED GUIDED AXILLARY SENTINEL LYMPH NODE Left 06/24/2022   Procedure: RADIOACTIVE SEED GUIDED LEFT AXILLARY  SENTINEL LYMPH NODE DISSECTION;  Surgeon: Griselda Miner, MD;  Location: [facility];  Service: General;  Laterality: Left;   Patient Active Problem List   Diagnosis Date Noted   Breast cancer (HCC) 06/24/2022   Carcinoma of central portion of left breast in female, estrogen receptor positive (HCC) 04/17/2022   Malignant neoplasm of overlapping sites of left breast in female, estrogen receptor positive (HCC) 04/16/2022   Preventative health care 07/30/2021   Uncontrolled type 2 diabetes mellitus with hyperglycemia (HCC) 06/26/2020   Anxiety 02/20/2013   Obesity (BMI 30-39.9) 02/19/2013   Hyperlipidemia 10/16/2009   Essential hypertension 12/26/2008   ALLERGIC RHINITIS 11/12/2007   COMMON MIGRAINE 12/08/2006   SKIN RASH 12/08/2006   Hypothyroidism 10/20/2006   BRONCHITIS, ACUTE 10/20/2006    REFERRING PROVIDER: Dr. Chevis Pretty  REFERRING DIAG: Left breast cancer  THERAPY DIAG:  Malignant neoplasm of overlapping sites of left breast in female, estrogen receptor positive (HCC)  Abnormal posture  Aftercare following surgery for neoplasm  Rationale for Evaluation and Treatment: Rehabilitation  ONSET DATE: 06/24/2022  SUBJECTIVE:  SUBJECTIVE STATEMENT: She reports she underwent a left mastectomy and axillary lymph node dissection on 06/24/2022. An expander was placed at the time of surgery. She had 1 of 4 axillary nodes positive and will have radiation and anti-estrogen therapy.   PERTINENT HISTORY:  Patient was diagnosed on 03/05/2022 with left grade 1 invasive lobular carcinoma breast cancer. She underwent a left mastectomy and axillary lymph node dissection on 06/24/2022. It is ER/PR positive and HER2 negative with a Ki67 of 2%.   PATIENT GOALS:  Reassess how my recovery is going related  to arm function, pain, and swelling.  PAIN:  Are you having pain? Yes: NPRS scale: varies/10 Pain location: left medial upper arm Pain description: Tingling Aggravating factors: nothing Relieving factors: nothing  PRECAUTIONS: Recent Surgery, left UE Lymphedema risk, Other: Plastic surgeon told pt to not reach above shoulder height  ACTIVITY LEVEL / LEISURE: She does not exercise   OBJECTIVE:   PATIENT SURVEYS:  QUICK DASH:  Quick Dash - 07/29/22 0001     Open a tight or new jar No difficulty    Do heavy household chores (wash walls, wash floors) No difficulty    Carry a shopping bag or briefcase No difficulty    Wash your back No difficulty    Use a knife to cut food No difficulty    Recreational activities in which you take some force or impact through your arm, shoulder, or hand (golf, hammering, tennis) No difficulty    During the past week, to what extent has your arm, shoulder or hand problem interfered with your normal social activities with family, friends, neighbors, or groups? Not at all    During the past week, to what extent has your arm, shoulder or hand problem limited your work or other regular daily activities Not at all    Arm, shoulder, or hand pain. None    Tingling (pins and needles) in your arm, shoulder, or hand Mild    Difficulty Sleeping No difficulty    DASH Score 2.27 %              OBSERVATIONS: Left breast incision is healing well. Drain site on lateral trunk is slightly open; applied ABD pad to that area. Mild swelling present in upper outer quadrant of left chest.   POSTURE:  Forward head and rounded shoulders   LYMPHEDEMA ASSESSMENT:   UPPER EXTREMITY AROM/PROM:   A/PROM RIGHT   eval    Shoulder extension 58  Shoulder flexion 158  Shoulder abduction 170  Shoulder internal rotation 62  Shoulder external rotation 80                          (Blank rows = not tested)   A/PROM LEFT   eval LEFT 07/29/2022  Shoulder extension 70  55  Shoulder flexion 148 121  Shoulder abduction 165 148  Shoulder internal rotation 66 72  Shoulder external rotation 87 75                          (Blank rows = not tested)   CERVICAL AROM: All within normal limits   UPPER EXTREMITY STRENGTH: WNL   LYMPHEDEMA ASSESSMENTS:    LANDMARK RIGHT   eval RIGHT 07/29/2022  10 cm proximal to olecranon process 37.3 36.8  Olecranon process 29 28.9  10 cm proximal to ulnar styloid process 25.8 25.4  Just proximal to ulnar styloid process 18.6 19.2  Across hand at thumb web space 19.2 19.4  At base of 2nd digit 6.3 6.4  (Blank rows = not tested)   LANDMARK LEFT   eval LEFT 07/29/2022  10 cm proximal to olecranon process 35.2 35.7  Olecranon process 27.4 27.9  10 cm proximal to ulnar styloid process 24.5 24.7  Just proximal to ulnar styloid process 17.5 17.5  Across hand at thumb web space 19.4 18.6  At base of 2nd digit 6.3 6.3  (Blank rows = not tested)  Surgery type/Date: 06/24/2022 Left mastectomy and sentinel node biopsy  Number of lymph nodes removed: 4 Current/past treatment (chemo, radiation, hormone therapy): None Other symptoms:  Heaviness/tightness No Pain Yes Pitting edema No Infections No Decreased scar mobility Yes Stemmer sign No  PATIENT EDUCATION:  Education details: Reviewed HEP Person educated: Patient Education method: Explanation Education comprehension: verbalized understanding  HOME EXERCISE PROGRAM: Reviewed previously given post op HEP.  ASSESSMENT:  CLINICAL IMPRESSION: Patient is doing well s/p left mastectomy with sentinel node biopsy and reconstruction with an expander on 06/24/2022. She appears to have regained full shoulder ROM but it is difficult to tell because her plastic surgeon has instructed her to not reach overhead, so measurements were taken only in painfree ROM and pt was told to not go beyond where she is comfortable. She has some mild edema present in her left upper outer chest  above her expander. She will benefit from PT to ensure she regains full shoulder ROM and be instructed with scar massage when MD allows.  Pt will benefit from skilled therapeutic intervention to improve on the following deficits: Decreased knowledge of precautions, impaired UE functional use, pain, decreased ROM, postural dysfunction.   PT treatment/interventions: ADL/Self care home management, Therapeutic exercises, Therapeutic activity, Patient/Family education, Self Care, DME instructions, Manual lymph drainage, scar mobilization, Manual therapy, and Re-evaluation   GOALS: Goals reviewed with patient? Yes  LONG TERM GOALS:  (STG=LTG)  GOALS Name Target Date  Goal status  1 Pt will demonstrate she has regained full shoulder ROM and function post operatively compared to baselines.  Baseline: 08/26/2022 IN PROGRESS  2 Pt will improve her DASH score to be </= 0 for improved UE function. 08/26/2022 INITIAL  3 Patient will verbalize good understanding of lymphedema risk reduction practices. 08/26/2022 INITIAL  4 Patient will demonstrate she can reach to >/= 148 degrees of left shoulder flexion and 165 abduction for returning to baseline and functional reach. 08/26/2022 INITIAL     PLAN:  PT FREQUENCY/DURATION: 2x/week for 4 weeks  PLAN FOR NEXT SESSION: PROM left shoulder; AAROM exercises; assess left drain site as it appeared slightly open today. I messaged Dr. Ulice Bold to ensure we are giving the pt proper instruction as the pt stated she was told by Dr. Kittie Plater PA to only use vaseline on her incision, not reach overhead, and continue sleeping in her bra which is rubbing the same area where the drain site is. We need to communicate the information clarification to the pt once Dr. Ulice Bold responds.   [facility]  [address], [address]  [place] [address]  [phone]  After Breast Cancer Class It is recommended you attend the ABC class to be educated  on lymphedema risk reduction. This class is free of charge and lasts for 1 hour. It is a 1-time class. You will need to download the TEAMS app either on your phone or computer. We will send you a link the night before or the morning  of the class. You should be able to click on that link to join the class. This is not a confidential class. You don't have to turn your camera on, but other participants may be able to see your email address. You are scheduled for May 20th at 12:00.  Scar massage HOLD OFF ON THIS UNTIL DR. DILLINGHAM ALLOWS IT:  You can begin gentle scar massage to you incision sites. Gently place one hand on the incision and move the skin (without sliding on the skin) in various directions. Do this for a few minutes and then you can gently massage either coconut oil or vitamin E cream into the scars.  Compression garment You should continue wearing your compression bra until you feel like you no longer have swelling.  Home exercise Program Continue doing the exercises you were given until you feel like you can do them without feeling any tightness at the end.   Walking Program Studies show that 30 minutes of walking per day (fast enough to elevate your heart rate) can significantly reduce the risk of a cancer recurrence. If you can't walk due to other medical reasons, we encourage you to find another activity you could do (like a stationary bike or water exercise).  Posture After breast cancer surgery, people frequently sit with rounded shoulders posture because it puts their incisions on slack and feels better. If you sit like this and scar tissue forms in that position, you can become very tight and have pain sitting or standing with good posture. Try to be aware of your posture and sit and stand up tall to heal properly.  Follow up PT: It is recommended you return every 3 months for the first 3 years following surgery to be assessed on the SOZO machine for an L-Dex score. This  helps prevent clinically significant lymphedema in 95% of patients. These follow up screens are 10 minute appointments that you are not billed for.  Bethann Punches, [place] 07/29/22 4:38 PM

## 2022-07-29 NOTE — Patient Instructions (Signed)
[facility]  [address]  [place] [address]  [phone]  After Breast Cancer Class It is recommended you attend the ABC class to be educated on lymphedema risk reduction. This class is free of charge and lasts for 1 hour. It is a 1-time class. You will need to download the TEAMS app either on your phone or computer. We will send you a link the night before or the morning of the class. You should be able to click on that link to join the class. This is not a confidential class. You don't have to turn your camera on, but other participants may be able to see your email address. You are scheduled for May 20th at 12:00.  Scar massage HOLD OFF ON THIS UNTIL DR. DILLINGHAM ALLOWS IT:  You can begin gentle scar massage to you incision sites. Gently place one hand on the incision and move the skin (without sliding on the skin) in various directions. Do this for a few minutes and then you can gently massage either coconut oil or vitamin E cream into the scars.  Compression garment You should continue wearing your compression bra until you feel like you no longer have swelling.  Home exercise Program Continue doing the exercises you were given until you feel like you can do them without feeling any tightness at the end.   Walking Program Studies show that 30 minutes of walking per day (fast enough to elevate your heart rate) can significantly reduce the risk of a cancer recurrence. If you can't walk due to other medical reasons, we encourage you to find another activity you could do (like a stationary bike or water exercise).  Posture After breast cancer surgery, people frequently sit with rounded shoulders posture because it puts their incisions on slack and feels better. If you sit like this and scar tissue forms in that position, you can become very tight and have pain sitting or standing with good posture. Try to be aware of your posture and sit and stand up  tall to heal properly.  Follow up PT: It is recommended you return every 3 months for the first 3 years following surgery to be assessed on the SOZO machine for an L-Dex score. This helps prevent clinically significant lymphedema in 95% of patients. These follow up screens are 10 minute appointments that you are not billed for.

## 2022-07-29 NOTE — Progress Notes (Signed)
Radiation Oncology         [phone] ________________________________  Name: Diane Hodges MRN: [id number]  Date: 07/30/2022  DOB: [birth year]  Follow-Up Visit Note  Outpatient  CC: Donato Schultz, DO  Malachy Mood, MD  Diagnosis:   No diagnosis found. ***  CHIEF COMPLAINT: Here to discuss management of *** breast cancer  Narrative:  The patient returns today for follow-up.     Since her breast clinic consultation date of 04/1722, she underwent the following imaging (dates and results as follows): ***    Breast or nodal biopsies, since consultation, involved (dates and results as follows): ***  Systemic therapy, if applicable, involved (dates and therapy as follows):  She opted to proceed with a left breast mastectomy with SLN biopsies (and reconstructive surgery) on 06/24/22 under the care of Dr. Carolynne Edouard.  Pathology from the procedure revealed: tumor size of at least 13.1 cm; histology of grade 2 invasive lobular carcinoma with ALH; all margins negative for invasive and in situ carcinoma; margin status to invasive disease of ***; margin status to in situ disease of ***; nodal status of ***;  ER status: ***; PR status ***, Her2 status ***; Grade ***.  Symptomatically, the patient reports: ***        ALLERGIES:  is allergic to azithromycin and other.  Meds: Current Outpatient Medications  Medication Sig Dispense Refill   blood glucose meter kit and supplies KIT Dispense based on patient and insurance preference. Use up to four times daily as directed. (FOR ICD-9 250.00, 250.01). 1 each 0   carvedilol (COREG) 6.25 MG tablet Take 1 tablet (6.25 mg total) by mouth 2 (two) times daily with a meal. 180 tablet 1   diazepam (VALIUM) 2 MG tablet Take 1 tablet (2 mg total) by mouth every 12 (twelve) hours as needed for up to 20 doses for muscle spasms. 20 tablet 0   fenofibrate 54 MG tablet TAKE 1 TABLET BY MOUTH EVERY DAY 90 tablet 1   glucose blood test strip Onetouch verio test  strips. Check blood sugar once a day 100 each 0   levothyroxine (SYNTHROID) 112 MCG tablet TAKE 1 TABLET BY MOUTH DAILY BEFORE BREAKFAST. 90 tablet 1   metFORMIN (GLUCOPHAGE) 500 MG tablet TAKE 1 TABLET BY MOUTH TWICE A DAY WITH FOOD 180 tablet 1   Multiple Vitamin (MULTIVITAMIN ADULT) TABS Take by mouth.     Omega-3 Fatty Acids (FISH OIL) 1200 MG CAPS Take 1 capsule by mouth daily.     ondansetron (ZOFRAN) 4 MG tablet Take 1 tablet (4 mg total) by mouth every 8 (eight) hours as needed for up to 20 doses for nausea or vomiting. 20 tablet 0   ONETOUCH DELICA LANCETS FINE MISC 1 Device by Does not apply route daily. 100 each 0   PROTEIN PO Take by mouth.     rosuvastatin (CRESTOR) 10 MG tablet Take 1 tablet (10 mg total) by mouth daily. 90 tablet 0   Semaglutide,0.25 or 0.5MG /DOS, 2 MG/3ML SOPN Inject 0.25 mg into the skin once a week. Increase to 0.5mg  in one month. 3 mL 1   traMADol (ULTRAM) 50 MG tablet Take 1 tablet (50 mg total) by mouth every 8 (eight) hours as needed for up to 20 doses for moderate pain or severe pain. 20 tablet 0   triamcinolone cream (KENALOG) 0.1 % APPLY TO AFFECTED AREA EVERY DAY  4   VITAMIN C, CALCIUM ASCORBATE, PO Take by mouth.     VITAMIN  D, CHOLECALCIFEROL, PO Take by mouth.     vitamin E 400 UNIT capsule Take 400 Units by mouth daily.     No current facility-administered medications for this encounter.    Physical Findings:  vitals were not taken for this visit. .     General: Alert and oriented, in no acute distress HEENT: Head is normocephalic. Extraocular movements are intact. Oropharynx is clear. Neck: Neck is supple, no palpable cervical or supraclavicular lymphadenopathy. Heart: Regular in rate and rhythm with no murmurs, rubs, or gallops. Chest: Clear to auscultation bilaterally, with no rhonchi, wheezes, or rales. Abdomen: Soft, nontender, nondistended, with no rigidity or guarding. Extremities: No cyanosis or edema. Lymphatics: see Neck  Exam Musculoskeletal: symmetric strength and muscle tone throughout. Neurologic: No obvious focalities. Speech is fluent.  Psychiatric: Judgment and insight are intact. Affect is appropriate. Breast exam reveals ***  Lab Findings: Lab Results  Component Value Date   WBC 8.1 04/17/2022   HGB 13.7 04/17/2022   HCT 40.2 04/17/2022   MCV 93.5 04/17/2022   PLT 221 04/17/2022    @LASTCHEMISTRY @  Radiographic Findings: No results found.  Impression/Plan: We discussed adjuvant radiotherapy today.  I recommend *** in order to ***.  I reviewed the logistics, benefits, risks, and potential side effects of this treatment in detail. Risks may include but not necessary be limited to acute and late injury tissue in the radiation fields such as skin irritation (change in color/pigmentation, itching, dryness, pain, peeling). She may experience fatigue. We also discussed possible risk of long term cosmetic changes or scar tissue. There is also a smaller risk for lung toxicity, ***cardiac toxicity, ***brachial plexopathy, ***lymphedema, ***musculoskeletal changes, ***rib fragility or ***induction of a second malignancy, ***late chronic non-healing soft tissue wound.    The patient asked good questions which I answered to her satisfaction. She is enthusiastic about proceeding with treatment. A consent form has been *** signed and placed in her chart.  A total of *** medically necessary complex treatment devices will be fabricated and supervised by me: *** fields with MLCs for custom blocks to protect heart, and lungs;  and, a Vac-lok. MORE COMPLEX DEVICES MAY BE MADE IN DOSIMETRY FOR FIELD IN FIELD BEAMS FOR DOSE HOMOGENEITY.  I have requested : 3D Simulation which is medically necessary to give adequate dose to at risk tissues while sparing lungs and heart.  I have requested a DVH of the following structures: lungs, heart, *** lumpectomy cavity.    The patient will receive *** Gy in *** fractions to the ***  with *** fields.  This will be *** followed by a boost.  On date of service, in total, I spent *** minutes on this encounter. Patient was seen in person.  _____________________________________   Lonie Peak, MD  This document serves as a record of services personally performed by Lonie Peak, MD. It was created on her behalf by Neena Rhymes, a trained medical scribe. The creation of this record is based on the scribe's personal observations and the provider's statements to them. This document has been checked and approved by the attending provider.

## 2022-07-30 ENCOUNTER — Ambulatory Visit: Payer: Medicare Other | Admitting: Radiation Oncology

## 2022-07-30 ENCOUNTER — Ambulatory Visit
Admission: RE | Admit: 2022-07-30 | Discharge: 2022-07-30 | Disposition: A | Discharge status: Home / Self Care | Payer: Medicare Other | Source: Ambulatory Visit | Attending: Radiation Oncology | Admitting: Radiation Oncology

## 2022-07-30 ENCOUNTER — Encounter: Payer: Self-pay | Admitting: Radiation Oncology

## 2022-07-30 ENCOUNTER — Other Ambulatory Visit: Payer: Self-pay

## 2022-07-30 ENCOUNTER — Encounter: Payer: Self-pay | Admitting: Physical Therapy

## 2022-07-30 VITALS — BP 148/88 | HR 97 | Temp 97.0°F | Resp 18 | Ht 66.0 in | Wt 197.0 lb

## 2022-07-30 DIAGNOSIS — Z7984 Long term (current) use of oral hypoglycemic drugs: Secondary | ICD-10-CM | POA: Insufficient documentation

## 2022-07-30 DIAGNOSIS — Z170 Estrogen receptor positive status [ER+]: Secondary | ICD-10-CM | POA: Insufficient documentation

## 2022-07-30 DIAGNOSIS — Z79899 Other long term (current) drug therapy: Secondary | ICD-10-CM | POA: Insufficient documentation

## 2022-07-30 DIAGNOSIS — Z79890 Hormone replacement therapy (postmenopausal): Secondary | ICD-10-CM | POA: Insufficient documentation

## 2022-07-30 DIAGNOSIS — C50812 Malignant neoplasm of overlapping sites of left female breast: Secondary | ICD-10-CM | POA: Diagnosis not present

## 2022-07-30 DIAGNOSIS — Z9012 Acquired absence of left breast and nipple: Secondary | ICD-10-CM | POA: Insufficient documentation

## 2022-07-30 DIAGNOSIS — C50112 Malignant neoplasm of central portion of left female breast: Secondary | ICD-10-CM | POA: Diagnosis not present

## 2022-07-31 ENCOUNTER — Other Ambulatory Visit: Payer: Self-pay | Admitting: Family Medicine

## 2022-07-31 ENCOUNTER — Ambulatory Visit (INDEPENDENT_AMBULATORY_CARE_PROVIDER_SITE_OTHER): Payer: Medicare Other | Admitting: Physician Assistant

## 2022-07-31 DIAGNOSIS — E785 Hyperlipidemia, unspecified: Secondary | ICD-10-CM

## 2022-07-31 DIAGNOSIS — Z170 Estrogen receptor positive status [ER+]: Secondary | ICD-10-CM

## 2022-07-31 DIAGNOSIS — C50812 Malignant neoplasm of overlapping sites of left female breast: Secondary | ICD-10-CM

## 2022-07-31 NOTE — Progress Notes (Signed)
This is a 67 year old female seen in office for postop follow-up status post left-sided mastectomy with immediate left breast reconstruction and placement of tissue expander and Flex HD by Dr. Ulice Bold on 06/24/2022.  The patient was last seen in the office on 07/24/2022.  At that time she had been doing well with no significant issues or complaints.  She did have a another fill at that time and had 450/535 cc in the left-sided tissue expander.  Since last office visit she denies any complaints or concerns.  She denies any fever, swelling, pain.  She has followed up with radiation oncology who is recommending 28 sessions of radiation therapy to the left breast.  She notes that the radiation oncologist spoke with Dr. Ulice Bold and they were debating a timeframe for expander to implant.  Chaperone present.  On exam left expanders in place, incisions are clean dry intact, no overlying redness, no palpable fluid collections, bilateral mastectomy flaps are viable.  Injectable saline was placed into the left tissue expander.  Left: 50 cc for total of 500/535 cc.  Overall the patient is doing well without significant complaints or issues.  I did speak with Dr. Ulice Bold who is in touch with radiation oncology.  She does recommend proceeding with expander to implant.  We will try and get her on the schedule next 2 to 3 weeks.  I did update the patient on the plan and answered all of her questions.  Once we have an available date we will move forward with preop H&P.

## 2022-08-01 ENCOUNTER — Telehealth: Payer: Self-pay | Admitting: *Deleted

## 2022-08-01 NOTE — Telephone Encounter (Signed)
No auth req for [id number] + [id number] per [facility] Decision ID: [id number]

## 2022-08-05 ENCOUNTER — Ambulatory Visit: Payer: Medicare Other | Attending: General Surgery

## 2022-08-05 ENCOUNTER — Encounter: Payer: Self-pay | Admitting: *Deleted

## 2022-08-05 DIAGNOSIS — R293 Abnormal posture: Secondary | ICD-10-CM | POA: Diagnosis not present

## 2022-08-05 DIAGNOSIS — C50812 Malignant neoplasm of overlapping sites of left female breast: Secondary | ICD-10-CM | POA: Insufficient documentation

## 2022-08-05 DIAGNOSIS — Z170 Estrogen receptor positive status [ER+]: Secondary | ICD-10-CM | POA: Diagnosis not present

## 2022-08-05 DIAGNOSIS — Z483 Aftercare following surgery for neoplasm: Secondary | ICD-10-CM | POA: Diagnosis not present

## 2022-08-05 NOTE — Therapy (Signed)
OUTPATIENT PHYSICAL THERAPY BREAST CANCER POST OP FOLLOW UP   Patient Name: Diane Hodges MRN: [id number] DOB:May 10, 1955, 67 y.o., female Today's Date: 08/05/2022  END OF SESSION:  PT End of Session - 08/05/22 1159     Visit Number 3    Number of Visits 10    Date for PT Re-Evaluation 08/26/22    PT Start Time 1201    PT Stop Time 1254    PT Time Calculation (min) 53 min    Activity Tolerance Patient tolerated treatment well    Behavior During Therapy WFL for tasks assessed/performed             Past Medical History:  Diagnosis Date   Atypical nevus 04/12/2014   mid back -mild   DM (diabetes mellitus), type 2 (HCC)    Hyperlipemia    Hypertension    PONV (postoperative nausea and vomiting)    with one surgery   Thyroid disease    Hypothyridism   Past Surgical History:  Procedure Laterality Date   BREAST BIOPSY Left 04/08/2022   MM LT BREAST BX W LOC DEV EA AD LESION IMG BX SPEC STEREO GUIDE 04/08/2022 [facility]   BREAST BIOPSY Left 04/08/2022   MM LT BREAST BX W LOC DEV 1ST LESION IMAGE BX SPEC STEREO GUIDE 04/08/2022 [facility]   BREAST BIOPSY Left 06/21/2022   [place] LT RADIOACTIVE SEED LOC 06/21/2022 [facility]   BREAST RECONSTRUCTION WITH PLACEMENT OF TISSUE EXPANDER AND FLEX HD (ACELLULAR HYDRATED DERMIS) Left 06/24/2022   Procedure: IMMEDIATE LEFT BREAST RECONSTRUCTION WITH PLACEMENT OF TISSUE EXPANDER AND FLEX HD (ACELLULAR HYDRATED DERMIS);  Surgeon: Peggye Form, DO;  Location: [facility];  Service: Plastics;  Laterality: Left;   CATARACT EXTRACTION Right 2015   MASTECTOMY W/ SENTINEL NODE BIOPSY Left 06/24/2022   Procedure: LEFT MASTECTOMY WITH SENTINEL LYMPH NODE BIOPSY;  Surgeon: Griselda Miner, MD;  Location: [facility];  Service: General;  Laterality: Left;   OOPHORECTOMY  1980   RADIOACTIVE SEED GUIDED AXILLARY SENTINEL LYMPH NODE Left 06/24/2022   Procedure: RADIOACTIVE SEED GUIDED LEFT AXILLARY  SENTINEL LYMPH NODE DISSECTION;  Surgeon: Griselda Miner, MD;  Location:  SURGERY CENTER;  Service: General;  Laterality: Left;   Patient Active Problem List   Diagnosis Date Noted   Breast cancer (HCC) 06/24/2022   Carcinoma of central portion of left breast in female, estrogen receptor positive (HCC) 04/17/2022   Malignant neoplasm of overlapping sites of left breast in female, estrogen receptor positive (HCC) 04/16/2022   Preventative health care 07/30/2021   Uncontrolled type 2 diabetes mellitus with hyperglycemia (HCC) 06/26/2020   Anxiety 02/20/2013   Obesity (BMI 30-39.9) 02/19/2013   Hyperlipidemia 10/16/2009   Essential hypertension 12/26/2008   ALLERGIC RHINITIS 11/12/2007   COMMON MIGRAINE 12/08/2006   SKIN RASH 12/08/2006   Hypothyroidism 10/20/2006   BRONCHITIS, ACUTE 10/20/2006    REFERRING PROVIDER: Dr. Chevis Pretty  REFERRING DIAG: Left breast cancer  THERAPY DIAG:  Malignant neoplasm of overlapping sites of left breast in female, estrogen receptor positive (HCC)  Abnormal posture  Aftercare following surgery for neoplasm  Rationale for Evaluation and Treatment: Rehabilitation  ONSET DATE: 06/24/2022  SUBJECTIVE:  SUBJECTIVE STATEMENT:  I haven't really done the exercises but I am using it more. I haven't checked the drain area lately. Pt is having expanders removed on 08/12/22 with radiation at some point following. Pts husband is pending open heart surgery  PERTINENT HISTORY:  Patient was diagnosed on 03/05/2022 with left grade 1 invasive lobular carcinoma breast cancer. She underwent a left mastectomy and axillary lymph node dissection on 06/24/2022. It is ER/PR positive and HER2 negative with a Ki67 of 2%. 1/4  LN's  PATIENT GOALS:  Reassess how my recovery is going  related to arm function, pain, and swelling.  PAIN:  Are you having pain? NO, just numbness posterior arm, no sensitivity to medial arm.  PRECAUTIONS: Recent Surgery, left UE Lymphedema risk, Other: Plastic surgeon told pt to not reach above shoulder height  ACTIVITY LEVEL / LEISURE: She does not exercise   OBJECTIVE:   PATIENT SURVEYS:  QUICK DASH:     OBSERVATIONS: Left breast incision is healing well. Drain site on lateral trunk is slightly open; applied ABD pad to that area. Mild swelling present in upper outer quadrant of left chest.   POSTURE:  Forward head and rounded shoulders   LYMPHEDEMA ASSESSMENT:   UPPER EXTREMITY AROM/PROM:   A/PROM RIGHT   eval    Shoulder extension 58  Shoulder flexion 158  Shoulder abduction 170  Shoulder internal rotation 62  Shoulder external rotation 80                          (Blank rows = not tested)   A/PROM LEFT   eval LEFT 07/29/2022 LEFT 08/05/2022  Shoulder extension 70 55 70  Shoulder flexion 148 121 157  Shoulder abduction 165 148 170  Shoulder internal rotation 66 72 65  Shoulder external rotation 87 75 104                          (Blank rows = not tested)   CERVICAL AROM: All within normal limits   UPPER EXTREMITY STRENGTH: WNL   LYMPHEDEMA ASSESSMENTS:    LANDMARK RIGHT   eval RIGHT 07/29/2022  10 cm proximal to olecranon process 37.3 36.8  Olecranon process 29 28.9  10 cm proximal to ulnar styloid process 25.8 25.4  Just proximal to ulnar styloid process 18.6 19.2  Across hand at thumb web space 19.2 19.4  At base of 2nd digit 6.3 6.4  (Blank rows = not tested)   LANDMARK LEFT   eval LEFT 07/29/2022  10 cm proximal to olecranon process 35.2 35.7  Olecranon process 27.4 27.9  10 cm proximal to ulnar styloid process 24.5 24.7  Just proximal to ulnar styloid process 17.5 17.5  Across hand at thumb web space 19.4 18.6  At base of 2nd digit 6.3 6.3  (Blank rows = not tested)  Surgery type/Date:  06/24/2022 Left mastectomy and sentinel node biopsy  Number of lymph nodes removed: 4 Current/past treatment (chemo, radiation, hormone therapy): None Other symptoms:  Heaviness/tightness No Pain Yes Pitting edema No Infections No Decreased scar mobility Yes Stemmer sign No    TREATMENT TODAY 08/05/22 Drain site healed but not ready for massage yet. Supine AAROM flexion x 5, star gazer x 5, standing lat stretch x 3, pec corner and single arm pec stretch x 3 Soft tissue mobilization to left UT, pectorals, lateral trunk with cocoa butter PROM left shoulder flexion, scaption, abduction, IR and ER MFR  to several very small cords in left axilla Measured AROM Updated HEP with standing lat and pectoral stretches  PATIENT EDUCATION:   Access Code: [facility] URL: [email or web address] Date: 08/05/2022 Prepared by: Alvira Monday  Exercises - Standing 'L' Stretch at Counter  - 2 x daily - 7 x weekly - 1 sets - 3-4 reps - 20 hold - Corner Pec Major Stretch  - 2 x daily - 7 x weekly - 1 sets - 3-4 reps - 20 hold - Single Arm Doorway Pec Stretch at 90 Degrees Abduction  - 2 x daily - 7 x weekly - 1 sets - 3-4 reps - 20 hold  Education details: Reviewed HEP Person educated: Patient Education method: Explanation Education comprehension: verbalized understanding  HOME EXERCISE PROGRAM: Reviewed previously given post op HEP.     ASSESSMENT:  CLINICAL IMPRESSION: Pt has returned to full AROM when measured today and she reports no real limitations at home. She is pending expander exchange on 08/12/2022. She will benefit from an additional visit to progress gentle strength, and discuss lymphedema, Pt will benefit from skilled therapeutic intervention to improve on the following deficits: Decreased knowledge of precautions, impaired UE functional use, pain, decreased ROM, postural dysfunction.   PT treatment/interventions: ADL/Self care home management, Therapeutic exercises,  Therapeutic activity, Patient/Family education, Self Care, DME instructions, Manual lymph drainage, scar mobilization, Manual therapy, and Re-evaluation   GOALS: Goals reviewed with patient? Yes  LONG TERM GOALS:  (STG=LTG)  GOALS Name Target Date  Goal status  1 Pt will demonstrate she has regained full shoulder ROM and function post operatively compared to baselines.  Baseline: 08/26/2022 IN PROGRESS  2 Pt will improve her DASH score to be </= 0 for improved UE function. 08/26/2022 INITIAL  3 Patient will verbalize good understanding of lymphedema risk reduction practices. 08/26/2022 INITIAL  4 Patient will demonstrate she can reach to >/= 148 degrees of left shoulder flexion and 165 abduction for returning to baseline and functional reach. 08/26/2022 MET 08/05/22     PLAN:  PT FREQUENCY/DURATION: 2x/week for 4 weeks  PLAN FOR NEXT SESSION:  Quick dash, jobes flexion/scaption, Tband exercises, Discuss Lymphedema,PROM left shoulder; AAROM exercises; assess left drain site as it appeared slightly open today. I messaged Dr. Ulice Bold to ensure we are giving the pt proper instruction as the pt stated she was told by Dr. Kittie Plater PA to only use vaseline on her incision, not reach overhead, and continue sleeping in her bra which is rubbing the same area where the drain site is. We need to communicate the information clarification to the pt once Dr. Ulice Bold responds.   [facility]  [address]  [place] [address]  [phone]  After Breast Cancer Class It is recommended you attend the ABC class to be educated on lymphedema risk reduction. This class is free of charge and lasts for 1 hour. It is a 1-time class. You will need to download the TEAMS app either on your phone or computer. We will send you a link the night before or the morning of the class. You should be able to click on that link to join the class. This is not a confidential class. You  don't have to turn your camera on, but other participants may be able to see your email address. You are scheduled for May 20th at 12:00.  Scar massage HOLD OFF ON THIS UNTIL DR. DILLINGHAM ALLOWS IT:  You can begin gentle scar massage to you incision sites.  Gently place one hand on the incision and move the skin (without sliding on the skin) in various directions. Do this for a few minutes and then you can gently massage either coconut oil or vitamin E cream into the scars.  Compression garment You should continue wearing your compression bra until you feel like you no longer have swelling.  Home exercise Program Continue doing the exercises you were given until you feel like you can do them without feeling any tightness at the end.   Walking Program Studies show that 30 minutes of walking per day (fast enough to elevate your heart rate) can significantly reduce the risk of a cancer recurrence. If you can't walk due to other medical reasons, we encourage you to find another activity you could do (like a stationary bike or water exercise).  Posture After breast cancer surgery, people frequently sit with rounded shoulders posture because it puts their incisions on slack and feels better. If you sit like this and scar tissue forms in that position, you can become very tight and have pain sitting or standing with good posture. Try to be aware of your posture and sit and stand up tall to heal properly.  Follow up PT: It is recommended you return every 3 months for the first 3 years following surgery to be assessed on the SOZO machine for an L-Dex score. This helps prevent clinically significant lymphedema in 95% of patients. These follow up screens are 10 minute appointments that you are not billed for.  Bethann Punches, [place] 08/05/22 1:02 PM

## 2022-08-06 ENCOUNTER — Encounter: Payer: Medicare Other | Admitting: Plastic Surgery

## 2022-08-07 ENCOUNTER — Ambulatory Visit (INDEPENDENT_AMBULATORY_CARE_PROVIDER_SITE_OTHER): Payer: Medicare Other | Admitting: Physician Assistant

## 2022-08-07 VITALS — BP 121/81 | HR 102

## 2022-08-07 DIAGNOSIS — C50812 Malignant neoplasm of overlapping sites of left female breast: Secondary | ICD-10-CM

## 2022-08-07 DIAGNOSIS — Z170 Estrogen receptor positive status [ER+]: Secondary | ICD-10-CM

## 2022-08-07 DIAGNOSIS — Z9889 Other specified postprocedural states: Secondary | ICD-10-CM

## 2022-08-07 MED ORDER — OXYCODONE-ACETAMINOPHEN 5-325 MG PO TABS: 1.0000 | ORAL_TABLET | Freq: Four times a day (QID) | ORAL | 0 refills | Status: DC | PRN

## 2022-08-07 MED ORDER — CEPHALEXIN 500 MG PO CAPS: 500.0000 mg | ORAL_CAPSULE | Freq: Three times a day (TID) | ORAL | 0 refills | Status: DC

## 2022-08-07 NOTE — Progress Notes (Signed)
This is a 67 year old female seen in our office for follow-up evaluation status post left-sided mastectomy with immediate left breast reconstruction and placement of tissue expander and Flex HD by Dr. Ulice Bold on 06/24/2022.  The patient was last seen in the office on 07/31/2022.  At that time she had been doing well without significant issues or complaints.  She had followed up with radiation oncology who recommended radiation therapy after implant placement.  Since her last office visit she denies any complaints or concerns.  Chaperone present.  On exam left tissue expander in place, incisions are clean dry and intact, no overlying redness, no palpable fluid collections, mastectomy flaps are viable.  Total of 500/535 cc expander  Overall the patient is doing well from postoperative standpoint.  She is scheduled for upcoming expander to implant and has a preop appointment separate for this.  She did clarify that she does not want right-sided mastectomy.

## 2022-08-07 NOTE — H&P (View-Only) (Signed)
This is a 67-year-old female seen in our office for follow-up evaluation status post left-sided mastectomy with immediate left breast reconstruction and placement of tissue expander and Flex HD by Dr. Dillingham on 06/24/2022.  The patient was last seen in the office on 07/31/2022.  At that time she had been doing well without significant issues or complaints.  She had followed up with radiation oncology who recommended radiation therapy after implant placement.  Since her last office visit she denies any complaints or concerns.  Chaperone present.  On exam left tissue expander in place, incisions are clean dry and intact, no overlying redness, no palpable fluid collections, mastectomy flaps are viable.  Total of 500/535 cc expander  Overall the patient is doing well from postoperative standpoint.  She is scheduled for upcoming expander to implant and has a preop appointment separate for this.  She did clarify that she does not want right-sided mastectomy. 

## 2022-08-07 NOTE — H&P (View-Only) (Signed)
   Patient ID: Diane Hodges, female    DOB: 07/18/1955, 66 y.o.   MRN: [id number]  Chief Complaint  Patient presents with   Pre-op Exam    No diagnosis found.   History of Present Illness:  Diane Hodges is a 66 y.o.  female  with a history of breast cancer.  She presents for preoperative evaluation for upcoming procedure, removal of tissue expander with placement of implant on 08/12/2022 by Dr. Dillingham.  The patient has had anesthesia prior, previously she had 1 experience of postoperative nausea and vomiting.  She did not have any significant issues after her last surgery.  The patient does not take any blood thinners or anticoagulants, she denies any significant personal or family history of DVT or PE or any other significant risk factors other than history of malignancy.  Summary of Previous Visit: The patient was last seen in the office on 07/31/2022.  At that time she had been doing well with no significant issues.  She will be proceeding with radiation therapy after the expander to implant.  She is also decided that she does not want a right sided mastopexy.  Job: Retired  PMH Significant for: Hypertension, type 2 diabetes, hypothyroidism recent A1c was 6.8 in November   Past Medical History: Allergies: Allergies  Allergen Reactions   Azithromycin     REACTION: rash   Other Other (See Comments)    Adhesive Strips causes blisters    Current Medications:  Current Outpatient Medications:    blood glucose meter kit and supplies KIT, Dispense based on patient and insurance preference. Use up to four times daily as directed. (FOR ICD-9 250.00, 250.01)., Disp: 1 each, Rfl: 0   carvedilol (COREG) 6.25 MG tablet, Take 1 tablet (6.25 mg total) by mouth 2 (two) times daily with a meal., Disp: 180 tablet, Rfl: 1   diazepam (VALIUM) 2 MG tablet, Take 1 tablet (2 mg total) by mouth every 12 (twelve) hours as needed for up to 20 doses for muscle spasms., Disp: 20 tablet,  Rfl: 0   fenofibrate 54 MG tablet, TAKE 1 TABLET BY MOUTH EVERY DAY, Disp: 90 tablet, Rfl: 1   glucose blood test strip, Onetouch verio test strips. Check blood sugar once a day, Disp: 100 each, Rfl: 0   levothyroxine (SYNTHROID) 112 MCG tablet, TAKE 1 TABLET BY MOUTH DAILY BEFORE BREAKFAST., Disp: 90 tablet, Rfl: 1   metFORMIN (GLUCOPHAGE) 500 MG tablet, TAKE 1 TABLET BY MOUTH TWICE A DAY WITH FOOD, Disp: 180 tablet, Rfl: 1   Multiple Vitamin (MULTIVITAMIN ADULT) TABS, Take by mouth., Disp: , Rfl:    Omega-3 Fatty Acids (FISH OIL) 1200 MG CAPS, Take 1 capsule by mouth daily., Disp: , Rfl:    ondansetron (ZOFRAN) 4 MG tablet, Take 1 tablet (4 mg total) by mouth every 8 (eight) hours as needed for up to 20 doses for nausea or vomiting., Disp: 20 tablet, Rfl: 0   ONETOUCH DELICA LANCETS FINE MISC, 1 Device by Does not apply route daily., Disp: 100 each, Rfl: 0   PROTEIN PO, Take by mouth., Disp: , Rfl:    rosuvastatin (CRESTOR) 10 MG tablet, TAKE 1 TABLET BY MOUTH EVERY DAY, Disp: 90 tablet, Rfl: 0   Semaglutide,0.25 or 0.5MG/DOS, 2 MG/3ML SOPN, Inject 0.25 mg into the skin once a week. Increase to 0.5mg in one month., Disp: 3 mL, Rfl: 1   traMADol (ULTRAM) 50 MG tablet, Take 1 tablet (50 mg total) by mouth every 8 (  eight) hours as needed for up to 20 doses for moderate pain or severe pain., Disp: 20 tablet, Rfl: 0   triamcinolone cream (KENALOG) 0.1 %, APPLY TO AFFECTED AREA EVERY DAY, Disp: , Rfl: 4   VITAMIN C, CALCIUM ASCORBATE, PO, Take by mouth., Disp: , Rfl:    VITAMIN D, CHOLECALCIFEROL, PO, Take by mouth., Disp: , Rfl:    vitamin E 400 UNIT capsule, Take 400 Units by mouth daily., Disp: , Rfl:   Past Medical Problems: Past Medical History:  Diagnosis Date   Atypical nevus 04/12/2014   mid back -mild   DM (diabetes mellitus), type 2 (HCC)    Hyperlipemia    Hypertension    PONV (postoperative nausea and vomiting)    with one surgery   Thyroid disease    Hypothyridism    Past  Surgical History: Past Surgical History:  Procedure Laterality Date   BREAST BIOPSY Left 04/08/2022   MM LT BREAST BX W LOC DEV EA AD LESION IMG BX SPEC STEREO GUIDE 04/08/2022 [facility]   BREAST BIOPSY Left 04/08/2022   MM LT BREAST BX W LOC DEV 1ST LESION IMAGE BX SPEC STEREO GUIDE 04/08/2022 [facility]   BREAST BIOPSY Left 06/21/2022   US LT RADIOACTIVE SEED LOC 06/21/2022 [facility]   BREAST RECONSTRUCTION WITH PLACEMENT OF TISSUE EXPANDER AND FLEX HD (ACELLULAR HYDRATED DERMIS) Left 06/24/2022   Procedure: IMMEDIATE LEFT BREAST RECONSTRUCTION WITH PLACEMENT OF TISSUE EXPANDER AND FLEX HD (ACELLULAR HYDRATED DERMIS);  Surgeon: Dillingham, Claire S, DO;  Location: [facility];  Service: Plastics;  Laterality: Left;   CATARACT EXTRACTION Right 2015   MASTECTOMY W/ SENTINEL NODE BIOPSY Left 06/24/2022   Procedure: LEFT MASTECTOMY WITH SENTINEL LYMPH NODE BIOPSY;  Surgeon: Toth, Paul III, MD;  Location: [facility];  Service: General;  Laterality: Left;   OOPHORECTOMY  1980   RADIOACTIVE SEED GUIDED AXILLARY SENTINEL LYMPH NODE Left 06/24/2022   Procedure: RADIOACTIVE SEED GUIDED LEFT AXILLARY SENTINEL LYMPH NODE DISSECTION;  Surgeon: Toth, Paul III, MD;  Location: [facility];  Service: General;  Laterality: Left;    Social History: Social History   Socioeconomic History   Marital status: Married    Spouse name: Not on file   Number of children: 2   Years of education: Not on file   Highest education level: Not on file  Occupational History   Occupation: credit reporting    Employer: [employer]  Tobacco Use   Smoking status: Never   Smokeless tobacco: Never  Vaping Use   Vaping Use: Never used  Substance and Sexual Activity   Alcohol use: No   Drug use: No   Sexual activity: Yes    Partners: Male    Birth control/protection: Post-menopausal  Other Topics Concern   Not on file  Social History Narrative    Exercise-- none   Social Determinants of Health   Financial Resource Strain: Low Risk  (02/08/2021)   Overall Financial Resource Strain (CARDIA)    Difficulty of Paying Living Expenses: Not hard at all  Food Insecurity: No Food Insecurity (02/13/2022)   Hunger Vital Sign    Worried About Running Out of Food in the Last Year: Never true    Ran Out of Food in the Last Year: Never true  Transportation Needs: No Transportation Needs (02/13/2022)   PRAPARE - Transportation    Lack of Transportation (Medical): No    Lack of Transportation (Non-Medical): No  Physical Activity: Inactive (  02/08/2021)   Exercise Vital Sign    Days of Exercise per Week: 0 days    Minutes of Exercise per Session: 0 min  Stress: No Stress Concern Present (02/08/2021)   [employer] - Occupational Stress Questionnaire    Feeling of Stress : Not at all  Social Connections: Socially Integrated (02/08/2021)   Social Connection and Isolation Panel [NHANES]    Frequency of Communication with Friends and Family: More than three times a week    Frequency of Social Gatherings with Friends and Family: More than three times a week    Attends Religious Services: More than 4 times per year    Active Member of Clubs or Organizations: Yes    Attends Club or Organization Meetings: More than 4 times per year    Marital Status: Married  Intimate Partner Violence: Not At Risk (02/13/2022)   Humiliation, Afraid, Rape, and Kick questionnaire    Fear of Current or Ex-Partner: No    Emotionally Abused: No    Physically Abused: No    Sexually Abused: No    Family History: Family History  Problem Relation Age of Onset   Cancer Mother        breast cancer or precancer   Hypertension Mother    Hypertension Father    Melanoma Father 88   Thyroid disease Other     Review of Systems: ROS  Physical Exam: Vital Signs BP 121/81 (BP Location: Left Arm, Patient Position: Sitting, Cuff Size: Large)    Pulse (!) 102   SpO2 94%   Physical Exam  Constitutional:      General: Not in acute distress.    Appearance: Normal appearance. Not ill-appearing.  HENT:     Head: Normocephalic and atraumatic.  Eyes:     Pupils: Pupils are equal, round. Cardiovascular:     Rate and Rhythm: Normal rate. Pulmonary:     Effort: No respiratory distress or increased work of breathing.  Speaks in full sentences. Musculoskeletal: Normal range of motion. No lower extremity swelling or edema. No varicosities.  Skin:    General: Skin is warm and dry.     Findings: No erythema or rash.  Neurological:     Mental Status: Alert and oriented to person, place, and time.  Psychiatric:        Mood and Affect: Mood normal.        Behavior: Behavior normal.    Assessment/Plan: The patient is scheduled for left-sided expander to implant with Dr. Dillingham.  Risks, benefits, and alternatives of procedure discussed, questions answered and consent obtained.    Smoking Status: Non-smoker Last Mammogram: 03/20/2022; Results: BI RADS category 4 suspicious  Caprini Score: 7; Risk Factors include: Age, BMI, history of malignancy, length of surgery; Recommendation is early ambulation  Pictures obtained: previous office visit  Post-op Rx sent to pharmacy: keflex, percocet  Patient was provided with the  General Surgical Risk consent document and Pain Medication Agreement prior to their appointment.  They had adequate time to read through the risk consent documents and Pain Medication Agreement. We also discussed them in person together during this preop appointment. All of their questions were answered to their satisfaction.  Recommended calling if they have any further questions.  Risk consent form and Pain Medication Agreement to be scanned into patient's chart.   Electronically signed by: Terryn Redner Todd Corrie Reder, PA-C 08/07/2022 2:59 PM  

## 2022-08-07 NOTE — Progress Notes (Signed)
Patient ID: Diane Hodges, female    DOB: 1955/07/19, 67 y.o.   MRN: [id number]  Chief Complaint  Patient presents with   Pre-op Exam    No diagnosis found.   History of Present Illness:  Diane Hodges is a 67 y.o.  female  with a history of breast cancer.  She presents for preoperative evaluation for upcoming procedure, removal of tissue expander with placement of implant on 08/12/2022 by Dr. Ulice Bold.  The patient has had anesthesia prior, previously she had 1 experience of postoperative nausea and vomiting.  She did not have any significant issues after her last surgery.  The patient does not take any blood thinners or anticoagulants, she denies any significant personal or family history of DVT or PE or any other significant risk factors other than history of malignancy.  Summary of Previous Visit: The patient was last seen in the office on 07/31/2022.  At that time she had been doing well with no significant issues.  She will be proceeding with radiation therapy after the expander to implant.  She is also decided that she does not want a right sided mastopexy.  Job: Retired  PMH Significant for: Hypertension, type 2 diabetes, hypothyroidism recent A1c was 6.8 in November   Past Medical History: Allergies: Allergies  Allergen Reactions   Azithromycin     REACTION: rash   Other Other (See Comments)    Adhesive Strips causes blisters    Current Medications:  Current Outpatient Medications:    blood glucose meter kit and supplies KIT, Dispense based on patient and insurance preference. Use up to four times daily as directed. (FOR ICD-9 250.00, 250.01)., Disp: 1 each, Rfl: 0   carvedilol (COREG) 6.25 MG tablet, Take 1 tablet (6.25 mg total) by mouth 2 (two) times daily with a meal., Disp: 180 tablet, Rfl: 1   diazepam (VALIUM) 2 MG tablet, Take 1 tablet (2 mg total) by mouth every 12 (twelve) hours as needed for up to 20 doses for muscle spasms., Disp: 20 tablet,  Rfl: 0   fenofibrate 54 MG tablet, TAKE 1 TABLET BY MOUTH EVERY DAY, Disp: 90 tablet, Rfl: 1   glucose blood test strip, Onetouch verio test strips. Check blood sugar once a day, Disp: 100 each, Rfl: 0   levothyroxine (SYNTHROID) 112 MCG tablet, TAKE 1 TABLET BY MOUTH DAILY BEFORE BREAKFAST., Disp: 90 tablet, Rfl: 1   metFORMIN (GLUCOPHAGE) 500 MG tablet, TAKE 1 TABLET BY MOUTH TWICE A DAY WITH FOOD, Disp: 180 tablet, Rfl: 1   Multiple Vitamin (MULTIVITAMIN ADULT) TABS, Take by mouth., Disp: , Rfl:    Omega-3 Fatty Acids (FISH OIL) 1200 MG CAPS, Take 1 capsule by mouth daily., Disp: , Rfl:    ondansetron (ZOFRAN) 4 MG tablet, Take 1 tablet (4 mg total) by mouth every 8 (eight) hours as needed for up to 20 doses for nausea or vomiting., Disp: 20 tablet, Rfl: 0   ONETOUCH DELICA LANCETS FINE MISC, 1 Device by Does not apply route daily., Disp: 100 each, Rfl: 0   PROTEIN PO, Take by mouth., Disp: , Rfl:    rosuvastatin (CRESTOR) 10 MG tablet, TAKE 1 TABLET BY MOUTH EVERY DAY, Disp: 90 tablet, Rfl: 0   Semaglutide,0.25 or 0.5MG /DOS, 2 MG/3ML SOPN, Inject 0.25 mg into the skin once a week. Increase to 0.5mg  in one month., Disp: 3 mL, Rfl: 1   traMADol (ULTRAM) 50 MG tablet, Take 1 tablet (50 mg total) by mouth every 8 (  eight) hours as needed for up to 20 doses for moderate pain or severe pain., Disp: 20 tablet, Rfl: 0   triamcinolone cream (KENALOG) 0.1 %, APPLY TO AFFECTED AREA EVERY DAY, Disp: , Rfl: 4   VITAMIN C, CALCIUM ASCORBATE, PO, Take by mouth., Disp: , Rfl:    VITAMIN D, CHOLECALCIFEROL, PO, Take by mouth., Disp: , Rfl:    vitamin E 400 UNIT capsule, Take 400 Units by mouth daily., Disp: , Rfl:   Past Medical Problems: Past Medical History:  Diagnosis Date   Atypical nevus 04/12/2014   mid back -mild   DM (diabetes mellitus), type 2 (HCC)    Hyperlipemia    Hypertension    PONV (postoperative nausea and vomiting)    with one surgery   Thyroid disease    Hypothyridism    Past  Surgical History: Past Surgical History:  Procedure Laterality Date   BREAST BIOPSY Left 04/08/2022   MM LT BREAST BX W LOC DEV EA AD LESION IMG BX SPEC STEREO GUIDE 04/08/2022 [facility]   BREAST BIOPSY Left 04/08/2022   MM LT BREAST BX W LOC DEV 1ST LESION IMAGE BX SPEC STEREO GUIDE 04/08/2022 [facility]   BREAST BIOPSY Left 06/21/2022   [place] LT RADIOACTIVE SEED LOC 06/21/2022 [facility]   BREAST RECONSTRUCTION WITH PLACEMENT OF TISSUE EXPANDER AND FLEX HD (ACELLULAR HYDRATED DERMIS) Left 06/24/2022   Procedure: IMMEDIATE LEFT BREAST RECONSTRUCTION WITH PLACEMENT OF TISSUE EXPANDER AND FLEX HD (ACELLULAR HYDRATED DERMIS);  Surgeon: Peggye Form, DO;  Location: [facility];  Service: Plastics;  Laterality: Left;   CATARACT EXTRACTION Right 2015   MASTECTOMY W/ SENTINEL NODE BIOPSY Left 06/24/2022   Procedure: LEFT MASTECTOMY WITH SENTINEL LYMPH NODE BIOPSY;  Surgeon: Griselda Miner, MD;  Location: [facility];  Service: General;  Laterality: Left;   OOPHORECTOMY  1980   RADIOACTIVE SEED GUIDED AXILLARY SENTINEL LYMPH NODE Left 06/24/2022   Procedure: RADIOACTIVE SEED GUIDED LEFT AXILLARY SENTINEL LYMPH NODE DISSECTION;  Surgeon: Griselda Miner, MD;  Location: [facility];  Service: General;  Laterality: Left;    Social History: Social History   Socioeconomic History   Marital status: Married    Spouse name: Not on file   Number of children: 2   Years of education: Not on file   Highest education level: Not on file  Occupational History   Occupation: credit reporting    Employer: [employer]  Tobacco Use   Smoking status: Never   Smokeless tobacco: Never  Vaping Use   Vaping Use: Never used  Substance and Sexual Activity   Alcohol use: No   Drug use: No   Sexual activity: Yes    Partners: Male    Birth control/protection: Post-menopausal  Other Topics Concern   Not on file  Social History Narrative    Exercise-- none   Social Determinants of Health   Financial Resource Strain: Low Risk  (02/08/2021)   Overall Financial Resource Strain (CARDIA)    Difficulty of Paying Living Expenses: Not hard at all  Food Insecurity: No Food Insecurity (02/13/2022)   Hunger Vital Sign    Worried About Running Out of Food in the Last Year: Never true    Ran Out of Food in the Last Year: Never true  Transportation Needs: No Transportation Needs (02/13/2022)   PRAPARE - Administrator, Civil Service (Medical): No    Lack of Transportation (Non-Medical): No  Physical Activity: Inactive (  02/08/2021)   Exercise Vital Sign    Days of Exercise per Week: 0 days    Minutes of Exercise per Session: 0 min  Stress: No Stress Concern Present (02/08/2021)   [employer] of Occupational Health - Occupational Stress Questionnaire    Feeling of Stress : Not at all  Social Connections: Socially Integrated (02/08/2021)   Social Connection and Isolation Panel [NHANES]    Frequency of Communication with Friends and Family: More than three times a week    Frequency of Social Gatherings with Friends and Family: More than three times a week    Attends Religious Services: More than 4 times per year    Active Member of [employer] or Organizations: Yes    Attends Engineer, structural: More than 4 times per year    Marital Status: Married  Catering manager Violence: Not At Risk (02/13/2022)   Humiliation, Afraid, Rape, and Kick questionnaire    Fear of Current or Ex-Partner: No    Emotionally Abused: No    Physically Abused: No    Sexually Abused: No    Family History: Family History  Problem Relation Age of Onset   Cancer Mother        breast cancer or precancer   Hypertension Mother    Hypertension Father    Melanoma Father 33   Thyroid disease Other     Review of Systems: ROS  Physical Exam: Vital Signs BP 121/81 (BP Location: Left Arm, Patient Position: Sitting, Cuff Size: Large)    Pulse (!) 102   SpO2 94%   Physical Exam  Constitutional:      General: Not in acute distress.    Appearance: Normal appearance. Not ill-appearing.  HENT:     Head: Normocephalic and atraumatic.  Eyes:     Pupils: Pupils are equal, round. Cardiovascular:     Rate and Rhythm: Normal rate. Pulmonary:     Effort: No respiratory distress or increased work of breathing.  Speaks in full sentences. Musculoskeletal: Normal range of motion. No lower extremity swelling or edema. No varicosities.  Skin:    General: Skin is warm and dry.     Findings: No erythema or rash.  Neurological:     Mental Status: Alert and oriented to person, place, and time.  Psychiatric:        Mood and Affect: Mood normal.        Behavior: Behavior normal.    Assessment/Plan: The patient is scheduled for left-sided expander to implant with Dr. Ulice Bold.  Risks, benefits, and alternatives of procedure discussed, questions answered and consent obtained.    Smoking Status: Non-smoker Last Mammogram: 03/20/2022; Results: BI RADS category 4 suspicious  Caprini Score: 7; Risk Factors include: Age, BMI, history of malignancy, length of surgery; Recommendation is early ambulation  Pictures obtained: previous office visit  Post-op Rx sent to pharmacy: keflex, percocet  Patient was provided with the  General Surgical Risk consent document and Pain Medication Agreement prior to their appointment.  They had adequate time to read through the risk consent documents and Pain Medication Agreement. We also discussed them in person together during this preop appointment. All of their questions were answered to their satisfaction.  Recommended calling if they have any further questions.  Risk consent form and Pain Medication Agreement to be scanned into patient's chart.   Electronically signed by: Kelle Darting Obrian Bulson, PA-C 08/07/2022 2:59 PM

## 2022-08-08 ENCOUNTER — Ambulatory Visit: Payer: Medicare Other

## 2022-08-08 DIAGNOSIS — R293 Abnormal posture: Secondary | ICD-10-CM

## 2022-08-08 DIAGNOSIS — C50812 Malignant neoplasm of overlapping sites of left female breast: Secondary | ICD-10-CM | POA: Diagnosis not present

## 2022-08-08 DIAGNOSIS — Z483 Aftercare following surgery for neoplasm: Secondary | ICD-10-CM | POA: Diagnosis not present

## 2022-08-08 DIAGNOSIS — Z170 Estrogen receptor positive status [ER+]: Secondary | ICD-10-CM | POA: Diagnosis not present

## 2022-08-08 NOTE — Patient Instructions (Signed)

## 2022-08-08 NOTE — Therapy (Signed)
OUTPATIENT PHYSICAL THERAPY BREAST CANCER POST OP FOLLOW UP   Patient Name: Diane Hodges MRN: [id number] DOB:[birth year], 67 y.o., female Today's Date: 08/08/2022  END OF SESSION:  PT End of Session - 08/08/22 1105     Visit Number 4    Number of Visits 10    Date for PT Re-Evaluation 08/26/22    PT Start Time 1104    PT Stop Time 1156    PT Time Calculation (min) 52 min    Activity Tolerance Patient tolerated treatment well    Behavior During Therapy WFL for tasks assessed/performed             Past Medical History:  Diagnosis Date   Atypical nevus 04/12/2014   mid back -mild   DM (diabetes mellitus), type 2 (HCC)    Hyperlipemia    Hypertension    PONV (postoperative nausea and vomiting)    with one surgery   Thyroid disease    Hypothyridism   Past Surgical History:  Procedure Laterality Date   BREAST BIOPSY Left 04/08/2022   MM LT BREAST BX W LOC DEV EA AD LESION IMG BX SPEC STEREO GUIDE 04/08/2022 [facility]   BREAST BIOPSY Left 04/08/2022   MM LT BREAST BX W LOC DEV 1ST LESION IMAGE BX SPEC STEREO GUIDE 04/08/2022 [facility]   BREAST BIOPSY Left 06/21/2022   [place] LT RADIOACTIVE SEED LOC 06/21/2022 [facility]   BREAST RECONSTRUCTION WITH PLACEMENT OF TISSUE EXPANDER AND FLEX HD (ACELLULAR HYDRATED DERMIS) Left 06/24/2022   Procedure: IMMEDIATE LEFT BREAST RECONSTRUCTION WITH PLACEMENT OF TISSUE EXPANDER AND FLEX HD (ACELLULAR HYDRATED DERMIS);  Surgeon: Peggye Form, DO;  Location: [facility];  Service: Plastics;  Laterality: Left;   CATARACT EXTRACTION Right 2015   MASTECTOMY W/ SENTINEL NODE BIOPSY Left 06/24/2022   Procedure: LEFT MASTECTOMY WITH SENTINEL LYMPH NODE BIOPSY;  Surgeon: Griselda Miner, MD;  Location: [facility];  Service: General;  Laterality: Left;   OOPHORECTOMY  1980   RADIOACTIVE SEED GUIDED AXILLARY SENTINEL LYMPH NODE Left 06/24/2022   Procedure: RADIOACTIVE SEED GUIDED LEFT AXILLARY  SENTINEL LYMPH NODE DISSECTION;  Surgeon: Griselda Miner, MD;  Location: [facility];  Service: General;  Laterality: Left;   Patient Active Problem List   Diagnosis Date Noted   Breast cancer (HCC) 06/24/2022   Carcinoma of central portion of left breast in female, estrogen receptor positive (HCC) 04/17/2022   Malignant neoplasm of overlapping sites of left breast in female, estrogen receptor positive (HCC) 04/16/2022   Preventative health care 07/30/2021   Uncontrolled type 2 diabetes mellitus with hyperglycemia (HCC) 06/26/2020   Anxiety 02/20/2013   Obesity (BMI 30-39.9) 02/19/2013   Hyperlipidemia 10/16/2009   Essential hypertension 12/26/2008   ALLERGIC RHINITIS 11/12/2007   COMMON MIGRAINE 12/08/2006   SKIN RASH 12/08/2006   Hypothyroidism 10/20/2006   BRONCHITIS, ACUTE 10/20/2006    REFERRING PROVIDER: Dr. Chevis Pretty  REFERRING DIAG: Left breast cancer  THERAPY DIAG:  Malignant neoplasm of overlapping sites of left breast in female, estrogen receptor positive (HCC)  Abnormal posture  Aftercare following surgery for neoplasm  Rationale for Evaluation and Treatment: Rehabilitation  ONSET DATE: 06/24/2022  SUBJECTIVE:  SUBJECTIVE STATEMENT:  I am doing most of what I need to do at home now. The main thing I can't do is sleep on my stomach.  Some of the tingling is starting to resolve. She is having an expander exchange done on Monday, May 13. She will have 25 sessions of radiation after she heals form expander exchange. Her husband is having open heart surgery on 08/19/22  PERTINENT HISTORY:  Patient was diagnosed on 03/05/2022 with left grade 1 invasive lobular carcinoma breast cancer. She underwent a left mastectomy and axillary lymph node dissection on 06/24/2022. It is ER/PR  positive and HER2 negative with a Ki67 of 2%. 1/4  LN's  PATIENT GOALS:  Reassess how my recovery is going related to arm function, pain, and swelling.  PAIN:  Are you having pain? NO, just numbness posterior arm, no sensitivity to medial arm.  PRECAUTIONS: Recent Surgery, left UE Lymphedema risk, Other: Plastic surgeon told pt to not reach above shoulder height  ACTIVITY LEVEL / LEISURE: She does not exercise   OBJECTIVE:   PATIENT SURVEYS:  QUICK DASH:  11:36  Quick Dash - 08/08/22 0001     Open a tight or new jar No difficulty    Do heavy household chores (wash walls, wash floors) No difficulty    Carry a shopping bag or briefcase No difficulty    Wash your back Mild difficulty    Use a knife to cut food No difficulty    Recreational activities in which you take some force or impact through your arm, shoulder, or hand (golf, hammering, tennis) No difficulty    During the past week, to what extent has your arm, shoulder or hand problem interfered with your normal social activities with family, friends, neighbors, or groups? Not at all    During the past week, to what extent has your arm, shoulder or hand problem limited your work or other regular daily activities Not at all    Arm, shoulder, or hand pain. Mild    Tingling (pins and needles) in your arm, shoulder, or hand Mild    Difficulty Sleeping Moderate difficulty    DASH Score 11.36 %               OBSERVATIONS: Left breast incision is healing well. Drain site on lateral trunk is slightly open; applied ABD pad to that area. Mild swelling present in upper outer quadrant of left chest.   POSTURE:  Forward head and rounded shoulders   LYMPHEDEMA ASSESSMENT:   UPPER EXTREMITY AROM/PROM:   A/PROM RIGHT   eval    Shoulder extension 58  Shoulder flexion 158  Shoulder abduction 170  Shoulder internal rotation 62  Shoulder external rotation 80                          (Blank rows = not tested)   A/PROM LEFT    eval LEFT 07/29/2022 LEFT 08/05/2022  Shoulder extension 70 55 70  Shoulder flexion 148 121 157  Shoulder abduction 165 148 170  Shoulder internal rotation 66 72 65  Shoulder external rotation 87 75 104                          (Blank rows = not tested)   CERVICAL AROM: All within normal limits   UPPER EXTREMITY STRENGTH: WNL   LYMPHEDEMA ASSESSMENTS:    LANDMARK RIGHT   eval RIGHT 07/29/2022  10 cm  proximal to olecranon process 37.3 36.8  Olecranon process 29 28.9  10 cm proximal to ulnar styloid process 25.8 25.4  Just proximal to ulnar styloid process 18.6 19.2  Across hand at thumb web space 19.2 19.4  At base of 2nd digit 6.3 6.4  (Blank rows = not tested)   LANDMARK LEFT   eval LEFT 07/29/2022  10 cm proximal to olecranon process 35.2 35.7  Olecranon process 27.4 27.9  10 cm proximal to ulnar styloid process 24.5 24.7  Just proximal to ulnar styloid process 17.5 17.5  Across hand at thumb web space 19.4 18.6  At base of 2nd digit 6.3 6.3  (Blank rows = not tested)  Surgery type/Date: 06/24/2022 Left mastectomy and sentinel node biopsy  Number of lymph nodes removed: 4 Current/past treatment (chemo, radiation, hormone therapy): None Other symptoms:  Heaviness/tightness No Pain Yes Pitting edema No Infections No Decreased scar mobility Yes Stemmer sign No    TREATMENT TODAY 08/08/2022 Completed quick dash Supine wand flex and scaption x5, stargazer x 5 Supine scapular series with yellow x 10 except sword x 5  VC's and TC's initially prn Updated HEP with pictures of wand and supine scapular series Gave handouts on lymphedema and discussed all with pt, answering pt questions as well  08/05/22 Drain site healed but not ready for massage yet. Supine AAROM flexion x 5, star gazer x 5, standing lat stretch x 3, pec corner and single arm pec stretch x 3 Soft tissue mobilization to left UT, pectorals, lateral trunk with cocoa butter PROM left shoulder flexion,  scaption, abduction, IR and ER MFR to several very small cords in left axilla Measured AROM Updated HEP with standing lat and pectoral stretches  PATIENT EDUCATION:   Access Code: [facility] URL: [email or web address] Date: 08/05/2022 Prepared by: Alvira Monday  Exercises - Standing 'L' Stretch at Counter  - 2 x daily - 7 x weekly - 1 sets - 3-4 reps - 20 hold - Corner Pec Major Stretch  - 2 x daily - 7 x weekly - 1 sets - 3-4 reps - 20 hold - Single Arm Doorway Pec Stretch at 90 Degrees Abduction  - 2 x daily - 7 x weekly - 1 sets - 3-4 reps - 20 hold  Education details: Reviewed HEP Person educated: Patient Education method: Explanation Education comprehension: verbalized understanding  HOME EXERCISE PROGRAM: Reviewed previously given post op HEP.     ASSESSMENT:  CLINICAL IMPRESSION:  Pt has achieved all goals except for quick dash goal. She has returned to shoulder ROM WNL, and has no real complaints at home. She did not achieve quick dash of 0 % and that is ongoing should pt have to return after expander exchange. She was educated in strengthening exercises and lymphedema and given handouts for both.  Pt will benefit from skilled therapeutic intervention to improve on the following deficits: Decreased knowledge of precautions, impaired UE functional use, pain, decreased ROM, postural dysfunction.   PT treatment/interventions: ADL/Self care home management, Therapeutic exercises, Therapeutic activity, Patient/Family education, Self Care, DME instructions, Manual lymph drainage, scar mobilization, Manual therapy, and Re-evaluation   GOALS: Goals reviewed with patient? Yes  LONG TERM GOALS:  (STG=LTG)  GOALS Name Target Date  Goal status  1 Pt will demonstrate she has regained full shoulder ROM and function post operatively compared to baselines.  Baseline: 08/26/2022 MET  2 Pt will improve her DASH score to be </= 0 for improved UE function. 08/26/2022  Ongoing 11% 08/08/2022  3 Patient will verbalize good understanding of lymphedema risk reduction practices. 08/26/2022 MET  4 Patient will demonstrate she can reach to >/= 148 degrees of left shoulder flexion and 165 abduction for returning to baseline and functional reach. 08/26/2022 MET 08/05/22     PLAN:  PT FREQUENCY/DURATION: 2x/week for 4 weeks  PLAN FOR NEXT SESSION:  Pt is on hold. She is doing well but will have her expander exchange on Monday. She is to call if she feels she needs to return, Her husband is having heart surgery the following week. [facility]  [address]  [place] [address]  [phone]  After Breast Cancer Class It is recommended you attend the ABC class to be educated on lymphedema risk reduction. This class is free of charge and lasts for 1 hour. It is a 1-time class. You will need to download the TEAMS app either on your phone or computer. We will send you a link the night before or the morning of the class. You should be able to click on that link to join the class. This is not a confidential class. You don't have to turn your camera on, but other participants may be able to see your email address. You are scheduled for May 20th at 12:00.  Scar massage HOLD OFF ON THIS UNTIL DR. DILLINGHAM ALLOWS IT:  You can begin gentle scar massage to you incision sites. Gently place one hand on the incision and move the skin (without sliding on the skin) in various directions. Do this for a few minutes and then you can gently massage either coconut oil or vitamin E cream into the scars.  Compression garment You should continue wearing your compression bra until you feel like you no longer have swelling.  Home exercise Program Continue doing the exercises you were given until you feel like you can do them without feeling any tightness at the end.   Walking Program Studies show that 30 minutes of walking per day (fast enough to elevate  your heart rate) can significantly reduce the risk of a cancer recurrence. If you can't walk due to other medical reasons, we encourage you to find another activity you could do (like a stationary bike or water exercise).  Posture After breast cancer surgery, people frequently sit with rounded shoulders posture because it puts their incisions on slack and feels better. If you sit like this and scar tissue forms in that position, you can become very tight and have pain sitting or standing with good posture. Try to be aware of your posture and sit and stand up tall to heal properly.  Follow up PT: It is recommended you return every 3 months for the first 3 years following surgery to be assessed on the SOZO machine for an L-Dex score. This helps prevent clinically significant lymphedema in 95% of patients. These follow up screens are 10 minute appointments that you are not billed for.  Bethann Punches, [place] 08/08/22 11:57 AM

## 2022-08-09 ENCOUNTER — Encounter: Payer: Self-pay | Admitting: *Deleted

## 2022-08-09 ENCOUNTER — Other Ambulatory Visit: Payer: Self-pay

## 2022-08-09 ENCOUNTER — Encounter (HOSPITAL_COMMUNITY): Payer: Self-pay | Admitting: Plastic Surgery

## 2022-08-09 NOTE — Progress Notes (Signed)
SDW call  Patient was given pre-op instructions over the phone. Patient verbalized understanding of instructions provided.    PCP - Dr. Florina Ou Oncologist: Dr. Mosetta Putt Cardiologist - Denies Pulmonary: Denies   PPM/ICD - Denies  Chest x-ray - n/a EKG -  06/19/2022 Stress Test - ECHO -  Cardiac Cath -   Sleep Study/sleep apnea/CPAP: Denies  Type II diabetic Fasting Blood sugar range: unsure How often check sugars: Does not check her sugar Ozempic, patient never started this medication Metformin, hold the day of surgery   Blood Thinner Instructions: Denies Aspirin Instructions:Denies   ERAS Protcol - Yes, clear liquids until 0830 PRE-SURGERY Ensure or G2-    COVID TEST- n/a    Anesthesia review: HTN, DM   Patient denies shortness of breath, fever, cough and chest pain over the phone call  Your procedure is scheduled on Monday Aug 12, 2022  Report to [facility] Main Entrance "A" at  0900  A.M., then check in with the Admitting office.  Call this number if you have problems the morning of surgery:  [phone]   If you have any questions prior to your surgery date call [phone]: Open Monday-Friday 8am-4pm If you experience any cold or flu symptoms such as cough, fever, chills, shortness of breath, etc. between now and your scheduled surgery, please notify us at the above number    Remember:  Do not eat after midnight the night before your surgery  You may drink clear liquids until 0830  the morning of your surgery.   Clear liquids allowed are: Water, Non-Citrus Juices (without pulp), Carbonated Beverages, Clear Tea, Black Coffee ONLY (NO MILK, CREAM OR POWDERED CREAMER of any kind), and Gatorade   Take these medicines the morning of surgery with A SIP OF WATER:  Carvedilol, fenofibrate, levothyroxine, crestor  As  of today, STOP taking any Aspirin (unless otherwise instructed by your surgeon) Aleve, Naproxen, Ibuprofen, Motrin, Advil, Goody's, BC's, all herbal  medications, fish oil, and all vitamins.

## 2022-08-12 ENCOUNTER — Ambulatory Visit (HOSPITAL_BASED_OUTPATIENT_CLINIC_OR_DEPARTMENT_OTHER): Payer: Medicare Other | Admitting: Physician Assistant

## 2022-08-12 ENCOUNTER — Encounter (HOSPITAL_COMMUNITY): Payer: Self-pay | Admitting: Plastic Surgery

## 2022-08-12 ENCOUNTER — Other Ambulatory Visit: Payer: Self-pay

## 2022-08-12 ENCOUNTER — Encounter (HOSPITAL_COMMUNITY)
Admission: RE | Disposition: A | Discharge status: Home / Self Care | Payer: Self-pay | Source: Home / Self Care | Attending: Plastic Surgery

## 2022-08-12 ENCOUNTER — Ambulatory Visit (HOSPITAL_COMMUNITY): Payer: Medicare Other | Admitting: Physician Assistant

## 2022-08-12 ENCOUNTER — Ambulatory Visit (HOSPITAL_COMMUNITY)
Admission: RE | Admit: 2022-08-12 | Discharge: 2022-08-12 | Disposition: A | Discharge status: Home / Self Care | Payer: Medicare Other | Attending: Plastic Surgery | Admitting: Plastic Surgery

## 2022-08-12 DIAGNOSIS — E119 Type 2 diabetes mellitus without complications: Secondary | ICD-10-CM | POA: Diagnosis not present

## 2022-08-12 DIAGNOSIS — I10 Essential (primary) hypertension: Secondary | ICD-10-CM | POA: Diagnosis not present

## 2022-08-12 DIAGNOSIS — Z853 Personal history of malignant neoplasm of breast: Secondary | ICD-10-CM | POA: Insufficient documentation

## 2022-08-12 DIAGNOSIS — C50912 Malignant neoplasm of unspecified site of left female breast: Secondary | ICD-10-CM | POA: Diagnosis not present

## 2022-08-12 DIAGNOSIS — E039 Hypothyroidism, unspecified: Secondary | ICD-10-CM | POA: Diagnosis not present

## 2022-08-12 DIAGNOSIS — Z9012 Acquired absence of left breast and nipple: Secondary | ICD-10-CM | POA: Diagnosis not present

## 2022-08-12 DIAGNOSIS — Z421 Encounter for breast reconstruction following mastectomy: Secondary | ICD-10-CM | POA: Insufficient documentation

## 2022-08-12 DIAGNOSIS — Z45812 Encounter for adjustment or removal of left breast implant: Secondary | ICD-10-CM | POA: Diagnosis not present

## 2022-08-12 HISTORY — PX: REMOVAL OF TISSUE EXPANDER AND PLACEMENT OF IMPLANT: SHX6457

## 2022-08-12 LAB — BASIC METABOLIC PANEL
Anion gap: 12 (ref 5–15)
BUN: 5 mg/dL — ABNORMAL LOW (ref 8–23)
CO2: 22 mmol/L (ref 22–32)
Calcium: 8.8 mg/dL — ABNORMAL LOW (ref 8.9–10.3)
Chloride: 104 mmol/L (ref 98–111)
Creatinine, Ser: 0.65 mg/dL (ref 0.44–1.00)
GFR, Estimated: >60 mL/min (ref >60)
Glucose, Bld: 123 mg/dL — ABNORMAL HIGH (ref 70–99)
Potassium: 3.6 mmol/L (ref 3.5–5.1)
Sodium: 138 mmol/L (ref 135–145)

## 2022-08-12 LAB — CBC
HCT: 36.1 % (ref 36.0–46.0)
Hemoglobin: 11.6 g/dL — ABNORMAL LOW (ref 12.0–15.0)
MCH: 30.1 pg (ref 26.0–34.0)
MCHC: 32.1 g/dL (ref 30.0–36.0)
MCV: 93.5 fL (ref 80.0–100.0)
Platelets: 205 10*3/uL (ref 150–400)
RBC: 3.86 MIL/uL — ABNORMAL LOW (ref 3.87–5.11)
RDW: 12.4 % (ref 11.5–15.5)
WBC: 9.9 10*3/uL (ref 4.0–10.5)
nRBC: 0 % (ref 0.0–0.2)

## 2022-08-12 LAB — GLUCOSE, CAPILLARY
Glucose-Capillary: 112 mg/dL — ABNORMAL HIGH (ref 70–99)
Glucose-Capillary: 110 mg/dL — ABNORMAL HIGH (ref 70–99)

## 2022-08-12 SURGERY — REMOVAL, TISSUE EXPANDER, BREAST, WITH IMPLANT INSERTION
Anesthesia: General | Site: Breast | Laterality: Left

## 2022-08-12 MED ORDER — CHLORHEXIDINE GLUCONATE 0.12 % MT SOLN
15.0000 mL | Freq: Once | OROMUCOSAL | Status: AC
  Administered 2022-08-12: 15 mL via OROMUCOSAL
  Filled 2022-08-12: qty 15

## 2022-08-12 MED ORDER — BUPIVACAINE HCL (PF) 0.25 % IJ SOLN
INTRAMUSCULAR | Status: AC
  Filled 2022-08-12: qty 30

## 2022-08-12 MED ORDER — SODIUM CHLORIDE 0.9 % IV SOLN
Freq: Once | INTRAVENOUS | Status: AC
  Administered 2022-08-12: 500 mL

## 2022-08-12 MED ORDER — FENTANYL CITRATE (PF) 100 MCG/2ML IJ SOLN: 25.0000 ug | INTRAMUSCULAR | Status: DC | PRN

## 2022-08-12 MED ORDER — FENTANYL CITRATE (PF) 250 MCG/5ML IJ SOLN
INTRAMUSCULAR | Status: AC
  Filled 2022-08-12: qty 5

## 2022-08-12 MED ORDER — SCOPOLAMINE 1 MG/3DAYS TD PT72
1.0000 | MEDICATED_PATCH | TRANSDERMAL | Status: DC
  Administered 2022-08-12: 1.5 mg via TRANSDERMAL

## 2022-08-12 MED ORDER — ORAL CARE MOUTH RINSE: 15.0000 mL | Freq: Once | OROMUCOSAL | Status: AC

## 2022-08-12 MED ORDER — SODIUM CHLORIDE 0.9% FLUSH: 3.0000 mL | Freq: Two times a day (BID) | INTRAVENOUS | Status: DC

## 2022-08-12 MED ORDER — SCOPOLAMINE 1 MG/3DAYS TD PT72
MEDICATED_PATCH | TRANSDERMAL | Status: AC
  Filled 2022-08-12: qty 1

## 2022-08-12 MED ORDER — MIDAZOLAM HCL 2 MG/2ML IJ SOLN
INTRAMUSCULAR | Status: DC | PRN
  Administered 2022-08-12: 2 mg via INTRAVENOUS

## 2022-08-12 MED ORDER — PROPOFOL 10 MG/ML IV BOLUS
INTRAVENOUS | Status: DC | PRN
  Administered 2022-08-12: 180 mg via INTRAVENOUS

## 2022-08-12 MED ORDER — DEXAMETHASONE SODIUM PHOSPHATE 10 MG/ML IJ SOLN
INTRAMUSCULAR | Status: DC | PRN
  Administered 2022-08-12: 10 mg via INTRAVENOUS

## 2022-08-12 MED ORDER — ACETAMINOPHEN 325 MG PO TABS: 650.0000 mg | ORAL_TABLET | ORAL | Status: DC | PRN

## 2022-08-12 MED ORDER — PROPOFOL 500 MG/50ML IV EMUL
INTRAVENOUS | Status: DC | PRN
  Administered 2022-08-12: 200 ug/kg/min via INTRAVENOUS

## 2022-08-12 MED ORDER — CHLORHEXIDINE GLUCONATE CLOTH 2 % EX PADS: 6.0000 | MEDICATED_PAD | Freq: Once | CUTANEOUS | Status: DC

## 2022-08-12 MED ORDER — MIDAZOLAM HCL 2 MG/2ML IJ SOLN
INTRAMUSCULAR | Status: AC
  Filled 2022-08-12: qty 2

## 2022-08-12 MED ORDER — PHENYLEPHRINE 80 MCG/ML (10ML) SYRINGE FOR IV PUSH (FOR BLOOD PRESSURE SUPPORT)
PREFILLED_SYRINGE | INTRAVENOUS | Status: DC | PRN
  Administered 2022-08-12: 80 ug via INTRAVENOUS

## 2022-08-12 MED ORDER — ACETAMINOPHEN 10 MG/ML IV SOLN
INTRAVENOUS | Status: AC
  Filled 2022-08-12: qty 100

## 2022-08-12 MED ORDER — OXYCODONE HCL 5 MG PO TABS: 5.0000 mg | ORAL_TABLET | ORAL | Status: DC | PRN

## 2022-08-12 MED ORDER — ACETAMINOPHEN 10 MG/ML IV SOLN
INTRAVENOUS | Status: DC | PRN
  Administered 2022-08-12: 1000 mg via INTRAVENOUS

## 2022-08-12 MED ORDER — ACETAMINOPHEN 650 MG RE SUPP: 650.0000 mg | RECTAL | Status: DC | PRN

## 2022-08-12 MED ORDER — 0.9 % SODIUM CHLORIDE (POUR BTL) OPTIME
TOPICAL | Status: DC | PRN
  Administered 2022-08-12: 1000 mL

## 2022-08-12 MED ORDER — ONDANSETRON HCL 4 MG/2ML IJ SOLN
INTRAMUSCULAR | Status: DC | PRN
  Administered 2022-08-12: 4 mg via INTRAVENOUS

## 2022-08-12 MED ORDER — SODIUM CHLORIDE 0.9% FLUSH: 3.0000 mL | INTRAVENOUS | Status: DC | PRN

## 2022-08-12 MED ORDER — HEMOSTATIC AGENTS (NO CHARGE) OPTIME
TOPICAL | Status: DC | PRN
  Administered 2022-08-12: 1 via TOPICAL

## 2022-08-12 MED ORDER — LACTATED RINGERS IV SOLN: INTRAVENOUS | Status: DC

## 2022-08-12 MED ORDER — LIDOCAINE-EPINEPHRINE 1 %-1:100000 IJ SOLN
INTRAMUSCULAR | Status: DC | PRN
  Administered 2022-08-12: 10 mL

## 2022-08-12 MED ORDER — LIDOCAINE-EPINEPHRINE 1 %-1:100000 IJ SOLN
INTRAMUSCULAR | Status: AC
  Filled 2022-08-12: qty 1

## 2022-08-12 MED ORDER — CEFAZOLIN SODIUM-DEXTROSE 2-4 GM/100ML-% IV SOLN
2.0000 g | INTRAVENOUS | Status: AC
  Administered 2022-08-12: 2 g via INTRAVENOUS
  Filled 2022-08-12: qty 100

## 2022-08-12 MED ORDER — PROPOFOL 10 MG/ML IV BOLUS
INTRAVENOUS | Status: AC
  Filled 2022-08-12: qty 20

## 2022-08-12 MED ORDER — FENTANYL CITRATE (PF) 250 MCG/5ML IJ SOLN
INTRAMUSCULAR | Status: DC | PRN
  Administered 2022-08-12 (×2): 50 ug via INTRAVENOUS
  Administered 2022-08-12: 25 ug via INTRAVENOUS

## 2022-08-12 MED ORDER — SODIUM CHLORIDE 0.9 % IV SOLN: 250.0000 mL | INTRAVENOUS | Status: DC | PRN

## 2022-08-12 MED ORDER — INSULIN ASPART 100 UNIT/ML IJ SOLN: 0.0000 [IU] | INTRAMUSCULAR | Status: DC | PRN

## 2022-08-12 SURGICAL SUPPLY — 47 items
ADH SKN CLS APL DERMABOND .7 (GAUZE/BANDAGES/DRESSINGS) ×1
APL PRP STRL LF DISP 70% ISPRP (MISCELLANEOUS) ×1
BAG COUNTER SPONGE SURGICOUNT (BAG) ×1 IMPLANT
BAG DECANTER FOR FLEXI CONT (MISCELLANEOUS) ×1 IMPLANT
BAG SPNG CNTER NS LX DISP (BAG) ×1
BINDER BREAST LRG (GAUZE/BANDAGES/DRESSINGS) ×1 IMPLANT
CANISTER SUCT 3000ML PPV (MISCELLANEOUS) ×1 IMPLANT
CHLORAPREP W/TINT 26 (MISCELLANEOUS) ×1 IMPLANT
COVER SURGICAL LIGHT HANDLE (MISCELLANEOUS) ×1 IMPLANT
DERMABOND ADVANCED .7 DNX12 (GAUZE/BANDAGES/DRESSINGS) IMPLANT
DRAIN CHANNEL 19F RND (DRAIN) ×1 IMPLANT
DRAPE HALF SHEET 40X57 (DRAPES) IMPLANT
DRAPE ORTHO SPLIT 77X108 STRL (DRAPES) ×2
DRAPE SURG ORHT 6 SPLT 77X108 (DRAPES) ×2 IMPLANT
DRAPE WARM FLUID 44X44 (DRAPES) ×1 IMPLANT
DRSG MEPILEX POST OP 4X8 (GAUZE/BANDAGES/DRESSINGS) IMPLANT
ELECT REM PT RETURN 9FT ADLT (ELECTROSURGICAL) ×1
ELECTRODE REM PT RTRN 9FT ADLT (ELECTROSURGICAL) ×1 IMPLANT
EVACUATOR SILICONE 100CC (DRAIN) ×1 IMPLANT
GAUZE PAD ABD 8X10 STRL (GAUZE/BANDAGES/DRESSINGS) ×2 IMPLANT
GAUZE SPONGE 4X4 12PLY STRL (GAUZE/BANDAGES/DRESSINGS) ×1 IMPLANT
GLOVE BIO SURGEON STRL SZ 6.5 (GLOVE) ×1 IMPLANT
GOWN STRL REUS W/ TWL LRG LVL3 (GOWN DISPOSABLE) ×2 IMPLANT
GOWN STRL REUS W/TWL LRG LVL3 (GOWN DISPOSABLE) ×2
HEMOSTAT ARISTA ABSORB 3G PWDR (HEMOSTASIS) IMPLANT
IMPL GEL HP 590CC (Breast) IMPLANT
IMPLANT GEL HP 590CC (Breast) ×1 IMPLANT
KIT BASIN OR (CUSTOM PROCEDURE TRAY) ×1 IMPLANT
KIT TURNOVER KIT B (KITS) ×1 IMPLANT
NS IRRIG 1000ML POUR BTL (IV SOLUTION) ×1 IMPLANT
PACK GENERAL/GYN (CUSTOM PROCEDURE TRAY) ×1 IMPLANT
PAD ARMBOARD 7.5X6 YLW CONV (MISCELLANEOUS) ×2 IMPLANT
SIZER BREAST REUSE 535CC (SIZER) ×1
SIZER BREAST REUSE 590CC (SIZER) ×1
SIZER BRST REUSE 590CC (SIZER) IMPLANT
SIZER BRST REUSE ULT HI 535CC (SIZER) IMPLANT
STAPLER VISISTAT 35W (STAPLE) ×1 IMPLANT
STRIP CLOSURE SKIN 1/2X4 (GAUZE/BANDAGES/DRESSINGS) ×2 IMPLANT
SUT MNCRL AB 3-0 PS2 18 (SUTURE) IMPLANT
SUT MNCRL AB 4-0 PS2 18 (SUTURE) IMPLANT
SUT PDS AB 2-0 CT1 27 (SUTURE) IMPLANT
SUT PDS AB 3-0 SH 27 (SUTURE) IMPLANT
SUT VIC AB 2-0 SH 18 (SUTURE) IMPLANT
TOWEL GREEN STERILE (TOWEL DISPOSABLE) ×1 IMPLANT
TOWEL GREEN STERILE FF (TOWEL DISPOSABLE) ×1 IMPLANT
TRAY FOLEY MTR SLVR 16FR STAT (SET/KITS/TRAYS/PACK) IMPLANT
WATER STERILE IRR 1000ML POUR (IV SOLUTION) IMPLANT

## 2022-08-12 NOTE — Interval H&P Note (Signed)
History and Physical Interval Note:  08/12/2022 11:01 AM  Diane Hodges  has presented today for surgery, with the diagnosis of Breast cancer.  The various methods of treatment have been discussed with the patient and family. After consideration of risks, benefits and other options for treatment, the patient has consented to  Procedure(s): REMOVAL OF TISSUE EXPANDER AND PLACEMENT OF IMPLANT (Left) R Breast lif for symmetry (Left) as a surgical intervention.  The patient's history has been reviewed, patient examined, no change in status, stable for surgery.  I have reviewed the patient's chart and labs.  Questions were answered to the patient's satisfaction.     Alena Bills Braidyn Peace

## 2022-08-12 NOTE — Anesthesia Procedure Notes (Signed)
Procedure Name: LMA Insertion Date/Time: 08/12/2022 11:46 AM  Performed by: Elgie Congo, CRNAPre-anesthesia Checklist: Patient identified, Emergency Drugs available, Suction available and Patient being monitored Patient Re-evaluated:Patient Re-evaluated prior to induction Oxygen Delivery Method: Circle system utilized Preoxygenation: Pre-oxygenation with 100% oxygen Induction Type: IV induction Ventilation: Mask ventilation without difficulty LMA Size: 4.0 Number of attempts: 1 Placement Confirmation: positive ETCO2 and breath sounds checked- equal and bilateral Tube secured with: Tape Dental Injury: Teeth and Oropharynx as per pre-operative assessment

## 2022-08-12 NOTE — Transfer of Care (Signed)
Immediate Anesthesia Transfer of Care Note  Patient: Diane Hodges  Procedure(s) Performed: REMOVAL OF TISSUE EXPANDER AND PLACEMENT OF IMPLANT (Left: Breast)  Patient Location: PACU  Anesthesia Type:General  Level of Consciousness: drowsy and patient cooperative  Airway & Oxygen Therapy: Patient Spontanous Breathing and Patient connected to face mask oxygen  Post-op Assessment: Report given to RN and Post -op Vital signs reviewed and stable  Post vital signs: Reviewed and stable  Last Vitals:  Vitals Value Taken Time  BP 113/77 08/12/22 1315  Temp 36.4 C 08/12/22 1315  Pulse 101 08/12/22 1322  Resp 18 08/12/22 1322  SpO2 96 % 08/12/22 1322  Vitals shown include unvalidated device data.  Last Pain:  Vitals:   08/12/22 0935  TempSrc:   PainSc: 0-No pain         Complications: No notable events documented.

## 2022-08-12 NOTE — Anesthesia Preprocedure Evaluation (Signed)
Anesthesia Evaluation  Patient identified by MRN, date of birth, ID band Patient awake    Reviewed: Allergy & Precautions, NPO status , Patient's Chart, lab work & pertinent test results  History of Anesthesia Complications (+) PONV and history of anesthetic complications  Airway Mallampati: III  TM Distance: >3 FB Neck ROM: Full    Dental  (+) Dental Advisory Given, Teeth Intact   Pulmonary neg pulmonary ROS   breath sounds clear to auscultation       Cardiovascular hypertension, Pt. on medications (-) angina (-) Past MI and (-) CHF  Rhythm:Regular     Neuro/Psych  Headaches, neg Seizures PSYCHIATRIC DISORDERS Anxiety        GI/Hepatic negative GI ROS, Neg liver ROS,,,  Endo/Other  diabetes, Type 2Hypothyroidism    Renal/GU      Musculoskeletal negative musculoskeletal ROS (+)    Abdominal   Peds  Hematology negative hematology ROS (+)   Anesthesia Other Findings   Reproductive/Obstetrics                             Anesthesia Physical Anesthesia Plan  ASA: 2  Anesthesia Plan: General   Post-op Pain Management: Ofirmev IV (intra-op)* and Toradol IV (intra-op)*   Induction: Intravenous  PONV Risk Score and Plan: 4 or greater and Ondansetron, Dexamethasone, Propofol infusion, TIVA and Midazolam  Airway Management Planned: Oral ETT and LMA  Additional Equipment: None  Intra-op Plan:   Post-operative Plan: Extubation in OR  Informed Consent: I have reviewed the patients History and Physical, chart, labs and discussed the procedure including the risks, benefits and alternatives for the proposed anesthesia with the patient or authorized representative who has indicated his/her understanding and acceptance.     Dental advisory given  Plan Discussed with: CRNA  Anesthesia Plan Comments:        Anesthesia Quick Evaluation

## 2022-08-12 NOTE — Interval H&P Note (Signed)
History and Physical Interval Note:  08/12/2022 11:01 AM  Diane Hodges  has presented today for surgery, with the diagnosis of Breast cancer.  The various methods of treatment have been discussed with the patient and family. After consideration of risks, benefits and other options for treatment, the patient has consented to  Procedure(s): REMOVAL OF TISSUE EXPANDER AND PLACEMENT OF IMPLANT (Left) R Breast lif for symmetry (Left) as a surgical intervention.  The patient's history has been reviewed, patient examined, no change in status, stable for surgery.  I have reviewed the patient's chart and labs.  Questions were answered to the patient's satisfaction.     Diane Hodges   

## 2022-08-12 NOTE — Op Note (Signed)
Op report Breast Expander Exchange   DATE OF OPERATION:  08/12/2022  LOCATION: [facility] Main Operating Room Outpatient  SURGICAL DIVISION: Plastic Surgery  PREOPERATIVE DIAGNOSES:  1. History of left breast cancer.  2. Acquired absence of left breast.   POSTOPERATIVE DIAGNOSES:  1. History of left breast cancer.  2. Acquired absence of left breast.   PROCEDURE:  1. Exchange of tissue expander for implant. 2. Capsulotomies for implant respositioning.  SURGEON: Foster Simpson, DO  ASSISTANT: Caroline More, PA  ANESTHESIA:  General.   COMPLICATIONS: None.   IMPLANTS: [place] Smooth Round Ultra High Profile Gel 590 cc.  INDICATIONS FOR PROCEDURE:  The patient, Diane Hodges, is a 67 y.o. female born on January 17, 1956, is here for further treatment after a mastectomy and placement of a tissue expander. She now presents for exchange of her expander for an implant.  She requires capsulotomies to better position the implant. MRN: [id number]  CONSENT:  Informed consent was obtained directly from the patient. Risks, benefits and alternatives were fully discussed. Specific risks including but not limited to bleeding, infection, hematoma, seroma, scarring, pain, implant infection, implant extrusion, capsular contracture, asymmetry, wound healing problems, and need for further surgery were all discussed. The patient did have an ample opportunity to have her questions answered to her satisfaction.   DESCRIPTION OF PROCEDURE:  The patient was taken to the operating room. SCDs were placed and IV antibiotics were given. The patient's chest was prepped and draped in a sterile fashion. A time out was performed and the implants to be used were identified.  Local with epinephrine was used to infiltrate the area.   The old mastectomy scar was excised laterally and opened.  This was to include the small dog ear that was lateral. Superior mastectomy and inferior mastectomy flaps were raised over the  pectoralis major muscle and ADM. The ADM was split to expose the tissue expander which was removed. Inspection of the pocket showed a normal healthy capsule and good integration of the biologic matrix.   Circumferential capsulotomies were performed to allow for breast pocket expansion.  Measurements were made to confirm adequate pocket size for the implant dimensions.  Hemostasis was ensured with electrocautery and Arista.  The pocket was irrigated with antibiotic solution.  New gloves were placed.  The implant was placed in the pocket and oriented appropriately. The capsule and ADM were closed with a 3-0 PDS suture. The deep layer was closed with the 3-0 PDS.  The remaining skin was closed with 3-0 Monocryl.  Dermabond was applied.  A breast binder and ABD was applied.  The patient was allowed to wake from anesthesia and taken to the recovery room in satisfactory condition.    The advanced practice practitioner (APP) assisted throughout the case.  The APP was essential in retraction and counter traction when needed to make the case progress smoothly.  This retraction and assistance made it possible to see the tissue plans for the procedure.  The assistance was needed for blood control, tissue re-approximation and assisted with closure of the incision site.

## 2022-08-12 NOTE — Discharge Instructions (Signed)
INSTRUCTIONS FOR AFTER BREAST SURGERY   You will likely have some questions about what to expect following your operation.  The following information will help you and your family understand what to expect when you are discharged from the hospital.  It is important to follow these guidelines to help ensure a smooth recovery and reduce complication.  Postoperative instructions include information on: diet, wound care, medications and physical activity.  AFTER SURGERY Expect to go home after the procedure.  In some cases, you may need to spend one night in the hospital for observation.  DIET Breast surgery does not require a specific diet.  However, the healthier you eat the better your body will heal. It is important to increasing your protein intake.  This means limiting the foods with sugar and carbohydrates.  Focus on vegetables and some meat.  If you have liposuction during your procedure be sure to drink water.  If your urine is bright yellow, then it is concentrated, and you need to drink more water.  As a general rule after surgery, you should have 8 ounces of water every hour while awake.  If you find you are persistently nauseated or unable to take in liquids let us know.  NO TOBACCO USE or EXPOSURE.  This will slow your healing process and lead to a wound.  WOUND CARE Leave the binder on for 3 days . Use fragrance free soap like Dial, Dove or Ivory.   After 3 days you can remove the binder to shower. Once dry apply binder or sports bra. If you have liposuction you will have a soft and spongy dressing (Lipofoam) that helps prevent creases in your skin.  Remove before you shower and then replace it.  It is also available on [employer]. If you have steri-strips / tape directly attached to your skin leave them in place. It is OK to get these wet.   No baths, pools or hot tubs for four weeks. We close your incision to leave the smallest and best-looking scar. No ointment or creams on your incisions  for four weeks.  No Neosporin (Too many skin reactions).  A few weeks after surgery you can use Mederma and start massaging the scar. We ask you to wear your binder or sports bra for the first 6 weeks around the clock, including while sleeping. This provides added comfort and helps reduce the fluid accumulation at the surgery site. NO Ice or heating pads to the operative site.  You have a very high risk of a BURN before you feel the temperature change.  ACTIVITY No heavy lifting until cleared by the doctor.  This usually means no more than a half-gallon of milk.  It is OK to walk and climb stairs. Moving your legs is very important to decrease your risk of a blood clot.  It will also help keep you from getting deconditioned.  Every 1 to 2 hours get up and walk for 5 minutes. This will help with a quicker recovery back to normal.  Let pain be your guide so you don't do too much.  This time is for you to recover.  You will be more comfortable if you sleep and rest with your head elevated either with a few pillows under you or in a recliner.  No stomach sleeping for a three months.  WORK Everyone returns to work at different times. As a rough guide, most people take at least 1 - 2 weeks off prior to returning to work. If   you need documentation for your job, give the forms to the front staff at the clinic.  DRIVING Arrange for someone to bring you home from the hospital after your surgery.  You may be able to drive a few days after surgery but not while taking any narcotics or valium.  BOWEL MOVEMENTS Constipation can occur after anesthesia and while taking pain medication.  It is important to stay ahead for your comfort.  We recommend taking Milk of Magnesia (2 tablespoons; twice a day) while taking the pain pills.  MEDICATIONS You may be prescribed should start after surgery At your preoperative visit for you history and physical you may have been given the following medications: An antibiotic: Start  this medication when you get home and take according to the instructions on the bottle. Zofran 4 mg:  This is to treat nausea and vomiting.  You can take this every 6 hours as needed and only if needed. Valium 2 mg for breast cancer patients: This is for muscle tightness if you have an implant or expander. This will help relax your muscle which also helps with pain control.  This can be taken every 12 hours as needed. Don't drive after taking this medication. Norco (hydrocodone/acetaminophen) 5/325 mg:  This is only to be used after you have taken the Motrin or the Tylenol. Every 8 hours as needed.   Over the counter Medication to take: Ibuprofen (Motrin) 600 mg:  Take this every 6 hours.  If you have additional pain then take 500 mg of the Tylenol every 8 hours.  Only take the Norco after you have tried these two. MiraLAX or Milk of Magnesia: Take this according to the bottle if you take the Norco.  WHEN TO CALL Call your surgeon's office if any of the following occur: Fever 101 degrees F or greater Excessive bleeding or fluid from the incision site. Pain that increases over time without aid from the medications Redness, warmth, or pus draining from incision sites Persistent nausea or inability to take in liquids Severe misshapen area that underwent the operation. About my Jackson-Pratt Bulb Drain  What is a Jackson-Pratt bulb? A Jackson-Pratt is a soft, round device used to collect drainage. It is connected to a long, thin drainage catheter, which is held in place by one or two small stiches near your surgical incision site. When the bulb is squeezed, it forms a vacuum, forcing the drainage to empty into the bulb.  Emptying the Jackson-Pratt bulb- To empty the bulb: 1. Release the plug on the top of the bulb. 2. Pour the bulb's contents into a measuring container which your nurse will provide. 3. Record the time emptied and amount of drainage. Empty the drain(s) as often as your      doctor or nurse recommends.  Date                  Time                    Amount (Drain 1)                 Amount (Drain 2)  _____________________________________________________________________  _____________________________________________________________________  _____________________________________________________________________  _____________________________________________________________________  _____________________________________________________________________  _____________________________________________________________________  _____________________________________________________________________  _____________________________________________________________________  Squeezing the Jackson-Pratt Bulb- To squeeze the bulb: 1. Make sure the plug at the top of the bulb is open. 2. Squeeze the bulb tightly in your fist. You will hear air squeezing from the bulb. 3. Replace the plug while the bulb   is squeezed. 4. Use a safety pin to attach the bulb to your clothing. This will keep the catheter from     pulling at the bulb insertion site.  When to call your doctor- Call your doctor if: Drain site becomes red, swollen or hot. You have a fever greater than 101 degrees F. There is oozing at the drain site. Drain falls out (apply a guaze bandage over the drain hole and secure it with tape). Drainage increases daily not related to activity patterns. (You will usually have more drainage when you are active than when you are resting.) Drainage has a bad odor.  

## 2022-08-13 ENCOUNTER — Encounter (HOSPITAL_COMMUNITY): Payer: Self-pay | Admitting: Plastic Surgery

## 2022-08-13 NOTE — Anesthesia Postprocedure Evaluation (Signed)
Anesthesia Post Note  Patient: Diane Hodges  Procedure(s) Performed: REMOVAL OF TISSUE EXPANDER AND PLACEMENT OF IMPLANT (Left: Breast)     Patient location during evaluation: PACU Anesthesia Type: General Level of consciousness: awake and alert Pain management: pain level controlled Vital Signs Assessment: post-procedure vital signs reviewed and stable Respiratory status: spontaneous breathing, nonlabored ventilation, respiratory function stable and patient connected to nasal cannula oxygen Cardiovascular status: blood pressure returned to baseline and stable Postop Assessment: no apparent nausea or vomiting Anesthetic complications: no   No notable events documented.  Last Vitals:  Vitals:   08/12/22 1345 08/12/22 1400  BP: (!) 149/83 137/72  Pulse: 86 85  Resp: 14 15  Temp:  36.4 C  SpO2: 97% 95%    Last Pain:  Vitals:   08/12/22 1400  TempSrc:   PainSc: 0-No pain                 Rudolpho Claxton

## 2022-08-19 ENCOUNTER — Encounter: Payer: Self-pay | Admitting: *Deleted

## 2022-08-20 ENCOUNTER — Ambulatory Visit (INDEPENDENT_AMBULATORY_CARE_PROVIDER_SITE_OTHER): Payer: Medicare Other | Admitting: Plastic Surgery

## 2022-08-20 ENCOUNTER — Encounter: Payer: Self-pay | Admitting: Plastic Surgery

## 2022-08-20 VITALS — BP 126/79 | HR 95

## 2022-08-20 DIAGNOSIS — Z9889 Other specified postprocedural states: Secondary | ICD-10-CM

## 2022-08-20 DIAGNOSIS — C50812 Malignant neoplasm of overlapping sites of left female breast: Secondary | ICD-10-CM

## 2022-08-20 NOTE — Progress Notes (Signed)
The patient is a 67 year old female here for follow-up after undergoing a left breast expander removal and placement of an implant on 08/12/2022.  She has a Mentor 590 cc smooth round ultra high profile implant in the left breast.  Today she looks good no sign of infection or seroma.  Swelling is as expected.  She did hear from radiation oncology and she will be going in for mapping in the next week.  Will see her back in 1 to 2 weeks.  Will get a picture at that time.

## 2022-08-24 ENCOUNTER — Other Ambulatory Visit: Payer: Self-pay | Admitting: Family Medicine

## 2022-08-24 DIAGNOSIS — I10 Essential (primary) hypertension: Secondary | ICD-10-CM

## 2022-08-30 ENCOUNTER — Ambulatory Visit
Admission: RE | Admit: 2022-08-30 | Discharge: 2022-08-30 | Disposition: A | Discharge status: Home / Self Care | Payer: Medicare Other | Source: Ambulatory Visit | Attending: Radiation Oncology | Admitting: Radiation Oncology

## 2022-08-30 DIAGNOSIS — Z170 Estrogen receptor positive status [ER+]: Secondary | ICD-10-CM | POA: Diagnosis not present

## 2022-08-30 DIAGNOSIS — C50112 Malignant neoplasm of central portion of left female breast: Secondary | ICD-10-CM | POA: Insufficient documentation

## 2022-08-30 DIAGNOSIS — Z510 Encounter for antineoplastic radiation therapy: Secondary | ICD-10-CM | POA: Diagnosis not present

## 2022-08-30 DIAGNOSIS — C50812 Malignant neoplasm of overlapping sites of left female breast: Secondary | ICD-10-CM | POA: Diagnosis not present

## 2022-09-02 ENCOUNTER — Ambulatory Visit (INDEPENDENT_AMBULATORY_CARE_PROVIDER_SITE_OTHER): Payer: Medicare Other | Admitting: Physician Assistant

## 2022-09-02 ENCOUNTER — Encounter: Payer: Self-pay | Admitting: *Deleted

## 2022-09-02 DIAGNOSIS — C50812 Malignant neoplasm of overlapping sites of left female breast: Secondary | ICD-10-CM

## 2022-09-02 DIAGNOSIS — Z170 Estrogen receptor positive status [ER+]: Secondary | ICD-10-CM

## 2022-09-02 NOTE — Progress Notes (Signed)
This is a 67 year old female seen in our office for follow-up visit status post left breast expander removal and placement of implant on 08/12/2022 by Dr. Ulice Bold.  She has a Mentor 590 cc smooth round ultrahigh profile implant in the left breast.  She was last seen in the office on 08/20/2022 by Dr. Ulice Bold.  At that time there were no issues, she was getting ready to start mapping with radiation oncology.  Since her last office visit she denies any significant complaints or concerns.  Chaperone present.  On exam left breast mastectomy flaps are viable, incisions clean dry and intact with some minimal scabbing over top, no significant wounds, no palpable fluid collections.  Overall the patient is doing well from postoperative standpoint.  She may use Vaseline on her incisions.  She is scheduled for radiation and will be starting this soon.  I instructed the patient to reach out to our office if she develops any issues or concerns throughout the radiation process otherwise she may follow-up afterwards if she has any questions or concerns.  She verbalized understanding and agreement to today's plan had no further questions or concerns.

## 2022-09-06 DIAGNOSIS — C50112 Malignant neoplasm of central portion of left female breast: Secondary | ICD-10-CM | POA: Insufficient documentation

## 2022-09-06 DIAGNOSIS — Z170 Estrogen receptor positive status [ER+]: Secondary | ICD-10-CM | POA: Insufficient documentation

## 2022-09-06 DIAGNOSIS — C50812 Malignant neoplasm of overlapping sites of left female breast: Secondary | ICD-10-CM | POA: Diagnosis not present

## 2022-09-06 DIAGNOSIS — Z510 Encounter for antineoplastic radiation therapy: Secondary | ICD-10-CM | POA: Insufficient documentation

## 2022-09-10 ENCOUNTER — Ambulatory Visit
Admission: RE | Admit: 2022-09-10 | Discharge: 2022-09-10 | Disposition: A | Discharge status: Home / Self Care | Payer: Medicare Other | Source: Ambulatory Visit | Attending: Radiation Oncology | Admitting: Radiation Oncology

## 2022-09-10 ENCOUNTER — Other Ambulatory Visit: Payer: Self-pay

## 2022-09-10 DIAGNOSIS — Z170 Estrogen receptor positive status [ER+]: Secondary | ICD-10-CM | POA: Diagnosis not present

## 2022-09-10 DIAGNOSIS — Z510 Encounter for antineoplastic radiation therapy: Secondary | ICD-10-CM | POA: Diagnosis not present

## 2022-09-10 DIAGNOSIS — C50112 Malignant neoplasm of central portion of left female breast: Secondary | ICD-10-CM | POA: Diagnosis not present

## 2022-09-10 DIAGNOSIS — C50812 Malignant neoplasm of overlapping sites of left female breast: Secondary | ICD-10-CM | POA: Diagnosis not present

## 2022-09-10 LAB — RAD ONC ARIA SESSION SUMMARY

## 2022-09-11 ENCOUNTER — Ambulatory Visit
Admission: RE | Admit: 2022-09-11 | Discharge: 2022-09-11 | Disposition: A | Discharge status: Home / Self Care | Payer: Medicare Other | Source: Ambulatory Visit | Attending: Radiation Oncology | Admitting: Radiation Oncology

## 2022-09-11 ENCOUNTER — Other Ambulatory Visit: Payer: Self-pay

## 2022-09-11 DIAGNOSIS — C50112 Malignant neoplasm of central portion of left female breast: Secondary | ICD-10-CM | POA: Diagnosis not present

## 2022-09-11 DIAGNOSIS — C50812 Malignant neoplasm of overlapping sites of left female breast: Secondary | ICD-10-CM | POA: Diagnosis not present

## 2022-09-11 DIAGNOSIS — Z170 Estrogen receptor positive status [ER+]: Secondary | ICD-10-CM | POA: Diagnosis not present

## 2022-09-11 DIAGNOSIS — Z510 Encounter for antineoplastic radiation therapy: Secondary | ICD-10-CM | POA: Diagnosis not present

## 2022-09-11 LAB — RAD ONC ARIA SESSION SUMMARY

## 2022-09-12 ENCOUNTER — Other Ambulatory Visit: Payer: Self-pay

## 2022-09-12 ENCOUNTER — Telehealth: Payer: Self-pay | Admitting: Hematology

## 2022-09-12 ENCOUNTER — Ambulatory Visit
Admission: RE | Admit: 2022-09-12 | Discharge: 2022-09-12 | Disposition: A | Discharge status: Home / Self Care | Payer: Medicare Other | Source: Ambulatory Visit | Attending: Radiation Oncology | Admitting: Radiation Oncology

## 2022-09-12 DIAGNOSIS — Z510 Encounter for antineoplastic radiation therapy: Secondary | ICD-10-CM | POA: Diagnosis not present

## 2022-09-12 DIAGNOSIS — C50812 Malignant neoplasm of overlapping sites of left female breast: Secondary | ICD-10-CM | POA: Diagnosis not present

## 2022-09-12 DIAGNOSIS — C50112 Malignant neoplasm of central portion of left female breast: Secondary | ICD-10-CM | POA: Diagnosis not present

## 2022-09-12 DIAGNOSIS — Z170 Estrogen receptor positive status [ER+]: Secondary | ICD-10-CM | POA: Diagnosis not present

## 2022-09-12 LAB — RAD ONC ARIA SESSION SUMMARY
Course Elapsed Days: 2
Plan Fractions Treated to Date: 2
Plan Fractions Treated to Date: 3
Plan Prescribed Dose Per Fraction: 1.8 Gy
Plan Prescribed Dose Per Fraction: 1.8 Gy
Plan Total Fractions Prescribed: 14
Plan Total Fractions Prescribed: 28
Plan Total Prescribed Dose: 25.2 Gy
Plan Total Prescribed Dose: 50.4 Gy
Reference Point Dosage Given to Date: 3.6 Gy
Reference Point Dosage Given to Date: 5.4 Gy
Reference Point Session Dosage Given: 1.8 Gy
Reference Point Session Dosage Given: 1.8 Gy
Session Number: 3

## 2022-09-12 NOTE — Progress Notes (Signed)
FMLA forms for son completed per Patient's request. Forms faxed to numbers provided. Fax transmission confirmations received.

## 2022-09-12 NOTE — Telephone Encounter (Signed)
Patient is aware of upcoming appointment date/times after treatment.

## 2022-09-13 ENCOUNTER — Other Ambulatory Visit: Payer: Self-pay

## 2022-09-13 ENCOUNTER — Ambulatory Visit
Admission: RE | Admit: 2022-09-13 | Discharge: 2022-09-13 | Disposition: A | Discharge status: Home / Self Care | Payer: Medicare Other | Source: Ambulatory Visit | Attending: Radiation Oncology | Admitting: Radiation Oncology

## 2022-09-13 DIAGNOSIS — Z170 Estrogen receptor positive status [ER+]: Secondary | ICD-10-CM | POA: Diagnosis not present

## 2022-09-13 DIAGNOSIS — C50812 Malignant neoplasm of overlapping sites of left female breast: Secondary | ICD-10-CM | POA: Diagnosis not present

## 2022-09-13 DIAGNOSIS — C50112 Malignant neoplasm of central portion of left female breast: Secondary | ICD-10-CM | POA: Diagnosis not present

## 2022-09-13 DIAGNOSIS — Z510 Encounter for antineoplastic radiation therapy: Secondary | ICD-10-CM | POA: Diagnosis not present

## 2022-09-13 LAB — RAD ONC ARIA SESSION SUMMARY
Course Elapsed Days: 3
Plan Fractions Treated to Date: 2
Plan Fractions Treated to Date: 4
Plan Prescribed Dose Per Fraction: 1.8 Gy
Plan Prescribed Dose Per Fraction: 1.8 Gy
Plan Total Fractions Prescribed: 14
Plan Total Fractions Prescribed: 28
Plan Total Prescribed Dose: 25.2 Gy
Plan Total Prescribed Dose: 50.4 Gy
Reference Point Dosage Given to Date: 3.6 Gy
Reference Point Dosage Given to Date: 7.2 Gy
Reference Point Session Dosage Given: 1.8 Gy
Reference Point Session Dosage Given: 1.8 Gy
Session Number: 4

## 2022-09-16 ENCOUNTER — Other Ambulatory Visit: Payer: Self-pay

## 2022-09-16 ENCOUNTER — Ambulatory Visit: Payer: Medicare Other | Attending: General Surgery | Admitting: Physical Therapy

## 2022-09-16 ENCOUNTER — Ambulatory Visit
Admission: RE | Admit: 2022-09-16 | Discharge: 2022-09-16 | Disposition: A | Discharge status: Home / Self Care | Payer: Medicare Other | Source: Ambulatory Visit | Attending: Radiation Oncology | Admitting: Radiation Oncology

## 2022-09-16 DIAGNOSIS — Z170 Estrogen receptor positive status [ER+]: Secondary | ICD-10-CM | POA: Insufficient documentation

## 2022-09-16 DIAGNOSIS — Z510 Encounter for antineoplastic radiation therapy: Secondary | ICD-10-CM | POA: Diagnosis not present

## 2022-09-16 DIAGNOSIS — C50812 Malignant neoplasm of overlapping sites of left female breast: Secondary | ICD-10-CM | POA: Insufficient documentation

## 2022-09-16 DIAGNOSIS — C50112 Malignant neoplasm of central portion of left female breast: Secondary | ICD-10-CM | POA: Diagnosis not present

## 2022-09-16 LAB — RAD ONC ARIA SESSION SUMMARY
Course Elapsed Days: 6
Plan Fractions Treated to Date: 3
Plan Fractions Treated to Date: 5
Plan Prescribed Dose Per Fraction: 1.8 Gy
Plan Prescribed Dose Per Fraction: 1.8 Gy
Plan Total Fractions Prescribed: 14
Plan Total Fractions Prescribed: 28
Plan Total Prescribed Dose: 25.2 Gy
Plan Total Prescribed Dose: 50.4 Gy
Reference Point Dosage Given to Date: 5.4 Gy
Reference Point Dosage Given to Date: 9 Gy
Reference Point Session Dosage Given: 1.8 Gy
Reference Point Session Dosage Given: 1.8 Gy
Session Number: 5

## 2022-09-16 NOTE — Therapy (Signed)
OUTPATIENT PHYSICAL THERAPY SOZO SCREENING NOTE   Patient Name: Diane Hodges MRN: [id number] DOB:1955/07/01, 67 y.o., female Today's Date: 09/16/2022  PCP: Donato Schultz, DO REFERRING PROVIDER: Griselda Miner, MD   PT End of Session - 09/16/22 1506     Visit Number 4    PT Start Time 1500    PT Stop Time 1506    PT Time Calculation (min) 6 min    Activity Tolerance Patient tolerated treatment well    Behavior During Therapy WFL for tasks assessed/performed             Past Medical History:  Diagnosis Date   Atypical nevus 04/12/2014   mid back -mild   DM (diabetes mellitus), type 2 (HCC)    Hyperlipemia    Hypertension    PONV (postoperative nausea and vomiting)    with one surgery   Thyroid disease    Hypothyridism   Past Surgical History:  Procedure Laterality Date   BREAST BIOPSY Left 04/08/2022   MM LT BREAST BX W LOC DEV EA AD LESION IMG BX SPEC STEREO GUIDE 04/08/2022 [facility]   BREAST BIOPSY Left 04/08/2022   MM LT BREAST BX W LOC DEV 1ST LESION IMAGE BX SPEC STEREO GUIDE 04/08/2022 [facility]   BREAST BIOPSY Left 06/21/2022   [place] LT RADIOACTIVE SEED LOC 06/21/2022 [facility]   BREAST RECONSTRUCTION WITH PLACEMENT OF TISSUE EXPANDER AND FLEX HD (ACELLULAR HYDRATED DERMIS) Left 06/24/2022   Procedure: IMMEDIATE LEFT BREAST RECONSTRUCTION WITH PLACEMENT OF TISSUE EXPANDER AND FLEX HD (ACELLULAR HYDRATED DERMIS);  Surgeon: Peggye Form, DO;  Location: [facility];  Service: Plastics;  Laterality: Left;   CATARACT EXTRACTION Right 2015   MASTECTOMY W/ SENTINEL NODE BIOPSY Left 06/24/2022   Procedure: LEFT MASTECTOMY WITH SENTINEL LYMPH NODE BIOPSY;  Surgeon: Griselda Miner, MD;  Location: [facility];  Service: General;  Laterality: Left;   OOPHORECTOMY  1980   RADIOACTIVE SEED GUIDED AXILLARY SENTINEL LYMPH NODE Left 06/24/2022   Procedure: RADIOACTIVE SEED GUIDED LEFT AXILLARY SENTINEL LYMPH NODE  DISSECTION;  Surgeon: Griselda Miner, MD;  Location: [facility];  Service: General;  Laterality: Left;   REMOVAL OF TISSUE EXPANDER AND PLACEMENT OF IMPLANT Left 08/12/2022   Procedure: REMOVAL OF TISSUE EXPANDER AND PLACEMENT OF IMPLANT;  Surgeon: Peggye Form, DO;  Location: [facility];  Service: Plastics;  Laterality: Left;   Patient Active Problem List   Diagnosis Date Noted   Breast cancer (HCC) 06/24/2022   Carcinoma of central portion of left breast in female, estrogen receptor positive (HCC) 04/17/2022   Malignant neoplasm of overlapping sites of left breast in female, estrogen receptor positive (HCC) 04/16/2022   Preventative health care 07/30/2021   Uncontrolled type 2 diabetes mellitus with hyperglycemia (HCC) 06/26/2020   Anxiety 02/20/2013   Obesity (BMI 30-39.9) 02/19/2013   Hyperlipidemia 10/16/2009   Essential hypertension 12/26/2008   ALLERGIC RHINITIS 11/12/2007   COMMON MIGRAINE 12/08/2006   SKIN RASH 12/08/2006   Hypothyroidism 10/20/2006   BRONCHITIS, ACUTE 10/20/2006    REFERRING DIAG: left breast cancer at risk for lymphedema  THERAPY DIAG:  Malignant neoplasm of overlapping sites of left breast in female, estrogen receptor positive (HCC)  PERTINENT HISTORY: Patient was diagnosed on 03/05/2022 with left grade 1 invasive lobular carcinoma breast cancer. She underwent a left mastectomy and axillary lymph node dissection on 06/24/2022. It is ER/PR positive and HER2 negative with a Ki67 of 2%. 1/4 LN's  PRECAUTIONS: left UE Lymphedema risk  SUBJECTIVE: Here for SOZO screen  PAIN:  Are you having pain? No  SOZO SCREENING: Patient was assessed today using the SOZO machine to determine the lymphedema index score. This was compared to her baseline score. It was determined that she is within the recommended range when compared to her baseline and no further action is needed at this time. She will continue SOZO screenings. These are done every 3  months for 2 years post operatively followed by every 6 months for 2 years, and then annually.   L-DEX FLOWSHEETS - 09/16/22 1500       L-DEX LYMPHEDEMA SCREENING   Measurement Type Unilateral    L-DEX MEASUREMENT EXTREMITY Upper Extremity    POSITION  Standing    DOMINANT SIDE Right    At Risk Side Left    BASELINE SCORE (UNILATERAL) -0.1    L-DEX SCORE (UNILATERAL) -2.3    VALUE CHANGE (UNILAT) -2.2            Bethann Punches, PT 09/16/22 3:08 PM

## 2022-09-17 ENCOUNTER — Other Ambulatory Visit: Payer: Self-pay

## 2022-09-17 ENCOUNTER — Ambulatory Visit
Admission: RE | Admit: 2022-09-17 | Discharge: 2022-09-17 | Disposition: A | Discharge status: Home / Self Care | Payer: Medicare Other | Source: Ambulatory Visit | Attending: Radiation Oncology | Admitting: Radiation Oncology

## 2022-09-17 ENCOUNTER — Encounter: Payer: Self-pay | Admitting: Physician Assistant

## 2022-09-17 ENCOUNTER — Ambulatory Visit (INDEPENDENT_AMBULATORY_CARE_PROVIDER_SITE_OTHER): Payer: Medicare Other | Admitting: Physician Assistant

## 2022-09-17 VITALS — BP 123/75 | HR 88 | Ht 66.0 in | Wt 194.0 lb

## 2022-09-17 DIAGNOSIS — C50812 Malignant neoplasm of overlapping sites of left female breast: Secondary | ICD-10-CM

## 2022-09-17 DIAGNOSIS — Z170 Estrogen receptor positive status [ER+]: Secondary | ICD-10-CM | POA: Diagnosis not present

## 2022-09-17 DIAGNOSIS — Z510 Encounter for antineoplastic radiation therapy: Secondary | ICD-10-CM | POA: Diagnosis not present

## 2022-09-17 DIAGNOSIS — C50112 Malignant neoplasm of central portion of left female breast: Secondary | ICD-10-CM | POA: Diagnosis not present

## 2022-09-17 LAB — RAD ONC ARIA SESSION SUMMARY
Course Elapsed Days: 7
Plan Fractions Treated to Date: 3
Plan Fractions Treated to Date: 6
Plan Prescribed Dose Per Fraction: 1.8 Gy
Plan Prescribed Dose Per Fraction: 1.8 Gy
Plan Total Fractions Prescribed: 14
Plan Total Fractions Prescribed: 28
Plan Total Prescribed Dose: 25.2 Gy
Plan Total Prescribed Dose: 50.4 Gy
Reference Point Dosage Given to Date: 10.8 Gy
Reference Point Dosage Given to Date: 5.4 Gy
Reference Point Session Dosage Given: 1.8 Gy
Reference Point Session Dosage Given: 1.8 Gy
Session Number: 6

## 2022-09-17 NOTE — Progress Notes (Signed)
This is a 67 year old female seen in our office for follow-up evaluation status post left breast expander removal and placement of implant on 08/12/2022 by Dr. Ulice Bold.  She has a Mentor 590 cc smooth round ultrahigh profile implant in her left breast.  She was last seen in the office on 09/02/2022.  At that time she had no issues.  Since that time she has started her radiation therapy, she denies any significant issues or concerns at today's visit.  Chaperone present.  On exam left breast mastectomy flaps are viable, incisions clean dry and intact with no significant wounds.  No palpable fluid collections.  Overall the patient is doing very well from postoperative standpoint.  I instructed her to follow-up in our clinic after completion of radiation or sooner as needed.  She verbalized understanding and agreement to today's plan.

## 2022-09-18 ENCOUNTER — Other Ambulatory Visit: Payer: Self-pay

## 2022-09-18 ENCOUNTER — Ambulatory Visit
Admission: RE | Admit: 2022-09-18 | Discharge: 2022-09-18 | Disposition: A | Discharge status: Home / Self Care | Payer: Medicare Other | Source: Ambulatory Visit | Attending: Radiation Oncology | Admitting: Radiation Oncology

## 2022-09-18 DIAGNOSIS — C50812 Malignant neoplasm of overlapping sites of left female breast: Secondary | ICD-10-CM | POA: Diagnosis not present

## 2022-09-18 DIAGNOSIS — Z170 Estrogen receptor positive status [ER+]: Secondary | ICD-10-CM | POA: Diagnosis not present

## 2022-09-18 DIAGNOSIS — Z510 Encounter for antineoplastic radiation therapy: Secondary | ICD-10-CM | POA: Diagnosis not present

## 2022-09-18 DIAGNOSIS — C50112 Malignant neoplasm of central portion of left female breast: Secondary | ICD-10-CM | POA: Diagnosis not present

## 2022-09-18 LAB — RAD ONC ARIA SESSION SUMMARY
Course Elapsed Days: 8
Plan Fractions Treated to Date: 4
Plan Fractions Treated to Date: 7
Plan Prescribed Dose Per Fraction: 1.8 Gy
Plan Prescribed Dose Per Fraction: 1.8 Gy
Plan Total Fractions Prescribed: 14
Plan Total Fractions Prescribed: 28
Plan Total Prescribed Dose: 25.2 Gy
Plan Total Prescribed Dose: 50.4 Gy
Reference Point Dosage Given to Date: 12.6 Gy
Reference Point Dosage Given to Date: 7.2 Gy
Reference Point Session Dosage Given: 1.8 Gy
Reference Point Session Dosage Given: 1.8 Gy
Session Number: 7

## 2022-09-19 ENCOUNTER — Other Ambulatory Visit: Payer: Self-pay

## 2022-09-19 ENCOUNTER — Ambulatory Visit
Admission: RE | Admit: 2022-09-19 | Discharge: 2022-09-19 | Disposition: A | Discharge status: Home / Self Care | Payer: Medicare Other | Source: Ambulatory Visit | Attending: Radiation Oncology | Admitting: Radiation Oncology

## 2022-09-19 DIAGNOSIS — C50812 Malignant neoplasm of overlapping sites of left female breast: Secondary | ICD-10-CM | POA: Diagnosis not present

## 2022-09-19 DIAGNOSIS — Z170 Estrogen receptor positive status [ER+]: Secondary | ICD-10-CM | POA: Diagnosis not present

## 2022-09-19 DIAGNOSIS — Z510 Encounter for antineoplastic radiation therapy: Secondary | ICD-10-CM | POA: Diagnosis not present

## 2022-09-19 DIAGNOSIS — C50112 Malignant neoplasm of central portion of left female breast: Secondary | ICD-10-CM | POA: Diagnosis not present

## 2022-09-19 LAB — RAD ONC ARIA SESSION SUMMARY
Course Elapsed Days: 9
Plan Fractions Treated to Date: 4
Plan Fractions Treated to Date: 8
Plan Prescribed Dose Per Fraction: 1.8 Gy
Plan Prescribed Dose Per Fraction: 1.8 Gy
Plan Total Fractions Prescribed: 14
Plan Total Fractions Prescribed: 28
Plan Total Prescribed Dose: 25.2 Gy
Plan Total Prescribed Dose: 50.4 Gy
Reference Point Dosage Given to Date: 14.4 Gy
Reference Point Dosage Given to Date: 7.2 Gy
Reference Point Session Dosage Given: 1.8 Gy
Reference Point Session Dosage Given: 1.8 Gy
Session Number: 8

## 2022-09-20 ENCOUNTER — Other Ambulatory Visit: Payer: Self-pay

## 2022-09-20 ENCOUNTER — Ambulatory Visit
Admission: RE | Admit: 2022-09-20 | Discharge: 2022-09-20 | Disposition: A | Discharge status: Home / Self Care | Payer: Medicare Other | Source: Ambulatory Visit | Attending: Radiation Oncology | Admitting: Radiation Oncology

## 2022-09-20 ENCOUNTER — Other Ambulatory Visit: Payer: Self-pay | Admitting: Family Medicine

## 2022-09-20 DIAGNOSIS — C50112 Malignant neoplasm of central portion of left female breast: Secondary | ICD-10-CM | POA: Diagnosis not present

## 2022-09-20 DIAGNOSIS — Z510 Encounter for antineoplastic radiation therapy: Secondary | ICD-10-CM | POA: Diagnosis not present

## 2022-09-20 DIAGNOSIS — C50812 Malignant neoplasm of overlapping sites of left female breast: Secondary | ICD-10-CM | POA: Diagnosis not present

## 2022-09-20 DIAGNOSIS — E039 Hypothyroidism, unspecified: Secondary | ICD-10-CM

## 2022-09-20 DIAGNOSIS — Z170 Estrogen receptor positive status [ER+]: Secondary | ICD-10-CM | POA: Diagnosis not present

## 2022-09-20 LAB — RAD ONC ARIA SESSION SUMMARY
Course Elapsed Days: 10
Plan Fractions Treated to Date: 5
Plan Fractions Treated to Date: 9
Plan Prescribed Dose Per Fraction: 1.8 Gy
Plan Prescribed Dose Per Fraction: 1.8 Gy
Plan Total Fractions Prescribed: 14
Plan Total Fractions Prescribed: 28
Plan Total Prescribed Dose: 25.2 Gy
Plan Total Prescribed Dose: 50.4 Gy
Reference Point Dosage Given to Date: 16.2 Gy
Reference Point Dosage Given to Date: 9 Gy
Reference Point Session Dosage Given: 1.8 Gy
Reference Point Session Dosage Given: 1.8 Gy
Session Number: 9

## 2022-09-23 ENCOUNTER — Ambulatory Visit
Admission: RE | Admit: 2022-09-23 | Discharge: 2022-09-23 | Disposition: A | Discharge status: Home / Self Care | Payer: Medicare Other | Source: Ambulatory Visit | Attending: Radiation Oncology | Admitting: Radiation Oncology

## 2022-09-23 ENCOUNTER — Other Ambulatory Visit: Payer: Self-pay

## 2022-09-23 DIAGNOSIS — Z510 Encounter for antineoplastic radiation therapy: Secondary | ICD-10-CM | POA: Diagnosis not present

## 2022-09-23 DIAGNOSIS — C50112 Malignant neoplasm of central portion of left female breast: Secondary | ICD-10-CM | POA: Diagnosis not present

## 2022-09-23 DIAGNOSIS — C50812 Malignant neoplasm of overlapping sites of left female breast: Secondary | ICD-10-CM | POA: Diagnosis not present

## 2022-09-23 DIAGNOSIS — Z170 Estrogen receptor positive status [ER+]: Secondary | ICD-10-CM | POA: Diagnosis not present

## 2022-09-23 LAB — RAD ONC ARIA SESSION SUMMARY
Course Elapsed Days: 13
Plan Fractions Treated to Date: 10
Plan Fractions Treated to Date: 5
Plan Prescribed Dose Per Fraction: 1.8 Gy
Plan Prescribed Dose Per Fraction: 1.8 Gy
Plan Total Fractions Prescribed: 14
Plan Total Fractions Prescribed: 28
Plan Total Prescribed Dose: 25.2 Gy
Plan Total Prescribed Dose: 50.4 Gy
Reference Point Dosage Given to Date: 18 Gy
Reference Point Dosage Given to Date: 9 Gy
Reference Point Session Dosage Given: 1.8 Gy
Reference Point Session Dosage Given: 1.8 Gy
Session Number: 10

## 2022-09-24 ENCOUNTER — Ambulatory Visit: Admission: RE | Admit: 2022-09-24 | Payer: Medicare Other | Source: Ambulatory Visit

## 2022-09-25 ENCOUNTER — Ambulatory Visit
Admission: RE | Admit: 2022-09-25 | Discharge: 2022-09-25 | Disposition: A | Discharge status: Home / Self Care | Payer: Medicare Other | Source: Ambulatory Visit | Attending: Radiation Oncology | Admitting: Radiation Oncology

## 2022-09-25 ENCOUNTER — Other Ambulatory Visit: Payer: Self-pay

## 2022-09-25 DIAGNOSIS — C50112 Malignant neoplasm of central portion of left female breast: Secondary | ICD-10-CM | POA: Diagnosis not present

## 2022-09-25 DIAGNOSIS — C50812 Malignant neoplasm of overlapping sites of left female breast: Secondary | ICD-10-CM | POA: Diagnosis not present

## 2022-09-25 DIAGNOSIS — Z170 Estrogen receptor positive status [ER+]: Secondary | ICD-10-CM | POA: Diagnosis not present

## 2022-09-25 DIAGNOSIS — Z510 Encounter for antineoplastic radiation therapy: Secondary | ICD-10-CM | POA: Diagnosis not present

## 2022-09-25 LAB — RAD ONC ARIA SESSION SUMMARY
Course Elapsed Days: 15
Plan Fractions Treated to Date: 11
Plan Fractions Treated to Date: 6
Plan Prescribed Dose Per Fraction: 1.8 Gy
Plan Prescribed Dose Per Fraction: 1.8 Gy
Plan Total Fractions Prescribed: 14
Plan Total Fractions Prescribed: 28
Plan Total Prescribed Dose: 25.2 Gy
Plan Total Prescribed Dose: 50.4 Gy
Reference Point Dosage Given to Date: 10.8 Gy
Reference Point Dosage Given to Date: 19.8 Gy
Reference Point Session Dosage Given: 1.8 Gy
Reference Point Session Dosage Given: 1.8 Gy
Session Number: 11

## 2022-09-26 ENCOUNTER — Other Ambulatory Visit: Payer: Self-pay

## 2022-09-26 ENCOUNTER — Ambulatory Visit
Admission: RE | Admit: 2022-09-26 | Discharge: 2022-09-26 | Disposition: A | Discharge status: Home / Self Care | Payer: Medicare Other | Source: Ambulatory Visit | Attending: Radiation Oncology | Admitting: Radiation Oncology

## 2022-09-26 DIAGNOSIS — Z510 Encounter for antineoplastic radiation therapy: Secondary | ICD-10-CM | POA: Diagnosis not present

## 2022-09-26 DIAGNOSIS — Z170 Estrogen receptor positive status [ER+]: Secondary | ICD-10-CM | POA: Diagnosis not present

## 2022-09-26 DIAGNOSIS — C50112 Malignant neoplasm of central portion of left female breast: Secondary | ICD-10-CM | POA: Diagnosis not present

## 2022-09-26 DIAGNOSIS — C50812 Malignant neoplasm of overlapping sites of left female breast: Secondary | ICD-10-CM | POA: Diagnosis not present

## 2022-09-26 LAB — RAD ONC ARIA SESSION SUMMARY
Course Elapsed Days: 16
Plan Fractions Treated to Date: 12
Plan Fractions Treated to Date: 6
Plan Prescribed Dose Per Fraction: 1.8 Gy
Plan Prescribed Dose Per Fraction: 1.8 Gy
Plan Total Fractions Prescribed: 14
Plan Total Fractions Prescribed: 28
Plan Total Prescribed Dose: 25.2 Gy
Plan Total Prescribed Dose: 50.4 Gy
Reference Point Dosage Given to Date: 10.8 Gy
Reference Point Dosage Given to Date: 21.6 Gy
Reference Point Session Dosage Given: 1.8 Gy
Reference Point Session Dosage Given: 1.8 Gy
Session Number: 12

## 2022-09-27 ENCOUNTER — Ambulatory Visit
Admission: RE | Admit: 2022-09-27 | Discharge: 2022-09-27 | Disposition: A | Discharge status: Home / Self Care | Payer: Medicare Other | Source: Ambulatory Visit | Attending: Radiation Oncology | Admitting: Radiation Oncology

## 2022-09-27 ENCOUNTER — Other Ambulatory Visit: Payer: Self-pay

## 2022-09-27 DIAGNOSIS — Z170 Estrogen receptor positive status [ER+]: Secondary | ICD-10-CM | POA: Diagnosis not present

## 2022-09-27 DIAGNOSIS — Z510 Encounter for antineoplastic radiation therapy: Secondary | ICD-10-CM | POA: Diagnosis not present

## 2022-09-27 DIAGNOSIS — C50812 Malignant neoplasm of overlapping sites of left female breast: Secondary | ICD-10-CM | POA: Diagnosis not present

## 2022-09-27 DIAGNOSIS — C50112 Malignant neoplasm of central portion of left female breast: Secondary | ICD-10-CM | POA: Diagnosis not present

## 2022-09-27 LAB — RAD ONC ARIA SESSION SUMMARY
Course Elapsed Days: 17
Plan Fractions Treated to Date: 13
Plan Fractions Treated to Date: 7
Plan Prescribed Dose Per Fraction: 1.8 Gy
Plan Prescribed Dose Per Fraction: 1.8 Gy
Plan Total Fractions Prescribed: 14
Plan Total Fractions Prescribed: 28
Plan Total Prescribed Dose: 25.2 Gy
Plan Total Prescribed Dose: 50.4 Gy
Reference Point Dosage Given to Date: 12.6 Gy
Reference Point Dosage Given to Date: 23.4 Gy
Reference Point Session Dosage Given: 1.8 Gy
Reference Point Session Dosage Given: 1.8 Gy
Session Number: 13

## 2022-09-30 ENCOUNTER — Ambulatory Visit: Payer: Medicare Other

## 2022-10-01 ENCOUNTER — Other Ambulatory Visit: Payer: Self-pay

## 2022-10-01 ENCOUNTER — Ambulatory Visit
Admission: RE | Admit: 2022-10-01 | Discharge: 2022-10-01 | Disposition: A | Discharge status: Home / Self Care | Payer: Medicare Other | Source: Ambulatory Visit | Attending: Radiation Oncology | Admitting: Radiation Oncology

## 2022-10-01 ENCOUNTER — Ambulatory Visit: Payer: Medicare Other

## 2022-10-01 DIAGNOSIS — Z9012 Acquired absence of left breast and nipple: Secondary | ICD-10-CM | POA: Diagnosis not present

## 2022-10-01 DIAGNOSIS — Z170 Estrogen receptor positive status [ER+]: Secondary | ICD-10-CM | POA: Diagnosis not present

## 2022-10-01 DIAGNOSIS — Z510 Encounter for antineoplastic radiation therapy: Secondary | ICD-10-CM | POA: Diagnosis not present

## 2022-10-01 DIAGNOSIS — C50812 Malignant neoplasm of overlapping sites of left female breast: Secondary | ICD-10-CM | POA: Insufficient documentation

## 2022-10-01 DIAGNOSIS — C50112 Malignant neoplasm of central portion of left female breast: Secondary | ICD-10-CM | POA: Diagnosis not present

## 2022-10-01 LAB — RAD ONC ARIA SESSION SUMMARY
Course Elapsed Days: 21
Plan Fractions Treated to Date: 14
Plan Fractions Treated to Date: 7
Plan Prescribed Dose Per Fraction: 1.8 Gy
Plan Prescribed Dose Per Fraction: 1.8 Gy
Plan Total Fractions Prescribed: 14
Plan Total Fractions Prescribed: 28
Plan Total Prescribed Dose: 25.2 Gy
Plan Total Prescribed Dose: 50.4 Gy
Reference Point Dosage Given to Date: 12.6 Gy
Reference Point Dosage Given to Date: 25.2 Gy
Reference Point Session Dosage Given: 1.8 Gy
Reference Point Session Dosage Given: 1.8 Gy
Session Number: 14

## 2022-10-02 ENCOUNTER — Other Ambulatory Visit: Payer: Self-pay

## 2022-10-02 ENCOUNTER — Ambulatory Visit
Admission: RE | Admit: 2022-10-02 | Discharge: 2022-10-02 | Disposition: A | Discharge status: Home / Self Care | Payer: Medicare Other | Source: Ambulatory Visit | Attending: Radiation Oncology | Admitting: Radiation Oncology

## 2022-10-02 DIAGNOSIS — Z170 Estrogen receptor positive status [ER+]: Secondary | ICD-10-CM | POA: Diagnosis not present

## 2022-10-02 DIAGNOSIS — Z510 Encounter for antineoplastic radiation therapy: Secondary | ICD-10-CM | POA: Diagnosis not present

## 2022-10-02 DIAGNOSIS — Z9012 Acquired absence of left breast and nipple: Secondary | ICD-10-CM | POA: Diagnosis not present

## 2022-10-02 DIAGNOSIS — C50112 Malignant neoplasm of central portion of left female breast: Secondary | ICD-10-CM | POA: Diagnosis not present

## 2022-10-02 DIAGNOSIS — C50812 Malignant neoplasm of overlapping sites of left female breast: Secondary | ICD-10-CM | POA: Diagnosis not present

## 2022-10-02 LAB — RAD ONC ARIA SESSION SUMMARY
Course Elapsed Days: 22
Plan Fractions Treated to Date: 15
Plan Fractions Treated to Date: 8
Plan Prescribed Dose Per Fraction: 1.8 Gy
Plan Prescribed Dose Per Fraction: 1.8 Gy
Plan Total Fractions Prescribed: 14
Plan Total Fractions Prescribed: 28
Plan Total Prescribed Dose: 25.2 Gy
Plan Total Prescribed Dose: 50.4 Gy
Reference Point Dosage Given to Date: 14.4 Gy
Reference Point Dosage Given to Date: 27 Gy
Reference Point Session Dosage Given: 1.8 Gy
Reference Point Session Dosage Given: 1.8 Gy
Session Number: 15

## 2022-10-04 ENCOUNTER — Other Ambulatory Visit: Payer: Self-pay

## 2022-10-04 ENCOUNTER — Ambulatory Visit
Admission: RE | Admit: 2022-10-04 | Discharge: 2022-10-04 | Disposition: A | Discharge status: Home / Self Care | Payer: Medicare Other | Source: Ambulatory Visit | Attending: Radiation Oncology | Admitting: Radiation Oncology

## 2022-10-04 DIAGNOSIS — Z510 Encounter for antineoplastic radiation therapy: Secondary | ICD-10-CM | POA: Diagnosis not present

## 2022-10-04 DIAGNOSIS — Z170 Estrogen receptor positive status [ER+]: Secondary | ICD-10-CM | POA: Diagnosis not present

## 2022-10-04 DIAGNOSIS — C50112 Malignant neoplasm of central portion of left female breast: Secondary | ICD-10-CM | POA: Diagnosis not present

## 2022-10-04 DIAGNOSIS — C50812 Malignant neoplasm of overlapping sites of left female breast: Secondary | ICD-10-CM | POA: Diagnosis not present

## 2022-10-04 DIAGNOSIS — Z9012 Acquired absence of left breast and nipple: Secondary | ICD-10-CM | POA: Diagnosis not present

## 2022-10-04 LAB — RAD ONC ARIA SESSION SUMMARY
Course Elapsed Days: 24
Plan Fractions Treated to Date: 16
Plan Fractions Treated to Date: 8
Plan Prescribed Dose Per Fraction: 1.8 Gy
Plan Prescribed Dose Per Fraction: 1.8 Gy
Plan Total Fractions Prescribed: 14
Plan Total Fractions Prescribed: 28
Plan Total Prescribed Dose: 25.2 Gy
Plan Total Prescribed Dose: 50.4 Gy
Reference Point Dosage Given to Date: 14.4 Gy
Reference Point Dosage Given to Date: 28.8 Gy
Reference Point Session Dosage Given: 1.8 Gy
Reference Point Session Dosage Given: 1.8 Gy
Session Number: 16

## 2022-10-07 ENCOUNTER — Ambulatory Visit
Admission: RE | Admit: 2022-10-07 | Discharge: 2022-10-07 | Disposition: A | Discharge status: Home / Self Care | Payer: Medicare Other | Source: Ambulatory Visit | Attending: Radiation Oncology | Admitting: Radiation Oncology

## 2022-10-07 ENCOUNTER — Other Ambulatory Visit: Payer: Self-pay

## 2022-10-07 DIAGNOSIS — Z510 Encounter for antineoplastic radiation therapy: Secondary | ICD-10-CM | POA: Diagnosis not present

## 2022-10-07 DIAGNOSIS — C50112 Malignant neoplasm of central portion of left female breast: Secondary | ICD-10-CM | POA: Diagnosis not present

## 2022-10-07 DIAGNOSIS — Z9012 Acquired absence of left breast and nipple: Secondary | ICD-10-CM | POA: Diagnosis not present

## 2022-10-07 DIAGNOSIS — Z170 Estrogen receptor positive status [ER+]: Secondary | ICD-10-CM | POA: Diagnosis not present

## 2022-10-07 DIAGNOSIS — C50812 Malignant neoplasm of overlapping sites of left female breast: Secondary | ICD-10-CM | POA: Diagnosis not present

## 2022-10-07 LAB — RAD ONC ARIA SESSION SUMMARY
Course Elapsed Days: 27
Plan Fractions Treated to Date: 17
Plan Fractions Treated to Date: 9
Plan Prescribed Dose Per Fraction: 1.8 Gy
Plan Prescribed Dose Per Fraction: 1.8 Gy
Plan Total Fractions Prescribed: 14
Plan Total Fractions Prescribed: 28
Plan Total Prescribed Dose: 25.2 Gy
Plan Total Prescribed Dose: 50.4 Gy
Reference Point Dosage Given to Date: 16.2 Gy
Reference Point Dosage Given to Date: 30.6 Gy
Reference Point Session Dosage Given: 1.8 Gy
Reference Point Session Dosage Given: 1.8 Gy
Session Number: 17

## 2022-10-08 ENCOUNTER — Ambulatory Visit
Admission: RE | Admit: 2022-10-08 | Discharge: 2022-10-08 | Disposition: A | Discharge status: Home / Self Care | Payer: Medicare Other | Source: Ambulatory Visit | Attending: Radiation Oncology | Admitting: Radiation Oncology

## 2022-10-08 ENCOUNTER — Other Ambulatory Visit: Payer: Self-pay

## 2022-10-08 DIAGNOSIS — Z9012 Acquired absence of left breast and nipple: Secondary | ICD-10-CM | POA: Diagnosis not present

## 2022-10-08 DIAGNOSIS — C50812 Malignant neoplasm of overlapping sites of left female breast: Secondary | ICD-10-CM | POA: Diagnosis not present

## 2022-10-08 DIAGNOSIS — C50112 Malignant neoplasm of central portion of left female breast: Secondary | ICD-10-CM | POA: Diagnosis not present

## 2022-10-08 DIAGNOSIS — Z510 Encounter for antineoplastic radiation therapy: Secondary | ICD-10-CM | POA: Diagnosis not present

## 2022-10-08 DIAGNOSIS — Z170 Estrogen receptor positive status [ER+]: Secondary | ICD-10-CM | POA: Diagnosis not present

## 2022-10-08 LAB — RAD ONC ARIA SESSION SUMMARY
Course Elapsed Days: 28
Plan Fractions Treated to Date: 18
Plan Fractions Treated to Date: 9
Plan Prescribed Dose Per Fraction: 1.8 Gy
Plan Prescribed Dose Per Fraction: 1.8 Gy
Plan Total Fractions Prescribed: 14
Plan Total Fractions Prescribed: 28
Plan Total Prescribed Dose: 25.2 Gy
Plan Total Prescribed Dose: 50.4 Gy
Reference Point Dosage Given to Date: 16.2 Gy
Reference Point Dosage Given to Date: 32.4 Gy
Reference Point Session Dosage Given: 1.8 Gy
Reference Point Session Dosage Given: 1.8 Gy
Session Number: 18

## 2022-10-09 ENCOUNTER — Other Ambulatory Visit: Payer: Self-pay

## 2022-10-09 ENCOUNTER — Ambulatory Visit
Admission: RE | Admit: 2022-10-09 | Discharge: 2022-10-09 | Disposition: A | Discharge status: Home / Self Care | Payer: Medicare Other | Source: Ambulatory Visit | Attending: Radiation Oncology | Admitting: Radiation Oncology

## 2022-10-09 DIAGNOSIS — C50812 Malignant neoplasm of overlapping sites of left female breast: Secondary | ICD-10-CM | POA: Diagnosis not present

## 2022-10-09 DIAGNOSIS — Z170 Estrogen receptor positive status [ER+]: Secondary | ICD-10-CM | POA: Diagnosis not present

## 2022-10-09 DIAGNOSIS — Z9012 Acquired absence of left breast and nipple: Secondary | ICD-10-CM | POA: Diagnosis not present

## 2022-10-09 DIAGNOSIS — C50112 Malignant neoplasm of central portion of left female breast: Secondary | ICD-10-CM | POA: Diagnosis not present

## 2022-10-09 DIAGNOSIS — Z510 Encounter for antineoplastic radiation therapy: Secondary | ICD-10-CM | POA: Diagnosis not present

## 2022-10-09 LAB — RAD ONC ARIA SESSION SUMMARY
Course Elapsed Days: 29
Plan Fractions Treated to Date: 10
Plan Fractions Treated to Date: 19
Plan Prescribed Dose Per Fraction: 1.8 Gy
Plan Prescribed Dose Per Fraction: 1.8 Gy
Plan Total Fractions Prescribed: 14
Plan Total Fractions Prescribed: 28
Plan Total Prescribed Dose: 25.2 Gy
Plan Total Prescribed Dose: 50.4 Gy
Reference Point Dosage Given to Date: 18 Gy
Reference Point Dosage Given to Date: 34.2 Gy
Reference Point Session Dosage Given: 1.8 Gy
Reference Point Session Dosage Given: 1.8 Gy
Session Number: 19

## 2022-10-10 ENCOUNTER — Other Ambulatory Visit: Payer: Self-pay

## 2022-10-10 ENCOUNTER — Ambulatory Visit
Admission: RE | Admit: 2022-10-10 | Discharge: 2022-10-10 | Disposition: A | Discharge status: Home / Self Care | Payer: Medicare Other | Source: Ambulatory Visit | Attending: Radiation Oncology | Admitting: Radiation Oncology

## 2022-10-10 DIAGNOSIS — C50112 Malignant neoplasm of central portion of left female breast: Secondary | ICD-10-CM | POA: Diagnosis not present

## 2022-10-10 DIAGNOSIS — C50812 Malignant neoplasm of overlapping sites of left female breast: Secondary | ICD-10-CM | POA: Diagnosis not present

## 2022-10-10 DIAGNOSIS — Z510 Encounter for antineoplastic radiation therapy: Secondary | ICD-10-CM | POA: Diagnosis not present

## 2022-10-10 DIAGNOSIS — Z9012 Acquired absence of left breast and nipple: Secondary | ICD-10-CM | POA: Diagnosis not present

## 2022-10-10 DIAGNOSIS — Z170 Estrogen receptor positive status [ER+]: Secondary | ICD-10-CM | POA: Diagnosis not present

## 2022-10-10 LAB — RAD ONC ARIA SESSION SUMMARY
Course Elapsed Days: 30
Plan Fractions Treated to Date: 10
Plan Fractions Treated to Date: 20
Plan Prescribed Dose Per Fraction: 1.8 Gy
Plan Prescribed Dose Per Fraction: 1.8 Gy
Plan Total Fractions Prescribed: 14
Plan Total Fractions Prescribed: 28
Plan Total Prescribed Dose: 25.2 Gy
Plan Total Prescribed Dose: 50.4 Gy
Reference Point Dosage Given to Date: 18 Gy
Reference Point Dosage Given to Date: 36 Gy
Reference Point Session Dosage Given: 1.8 Gy
Reference Point Session Dosage Given: 1.8 Gy
Session Number: 20

## 2022-10-11 ENCOUNTER — Other Ambulatory Visit: Payer: Self-pay

## 2022-10-11 ENCOUNTER — Ambulatory Visit: Admission: RE | Admit: 2022-10-11 | Payer: Medicare Other | Source: Ambulatory Visit

## 2022-10-11 DIAGNOSIS — C50112 Malignant neoplasm of central portion of left female breast: Secondary | ICD-10-CM | POA: Diagnosis not present

## 2022-10-11 DIAGNOSIS — Z170 Estrogen receptor positive status [ER+]: Secondary | ICD-10-CM | POA: Diagnosis not present

## 2022-10-11 DIAGNOSIS — Z510 Encounter for antineoplastic radiation therapy: Secondary | ICD-10-CM | POA: Diagnosis not present

## 2022-10-11 DIAGNOSIS — C50812 Malignant neoplasm of overlapping sites of left female breast: Secondary | ICD-10-CM | POA: Diagnosis not present

## 2022-10-11 DIAGNOSIS — Z9012 Acquired absence of left breast and nipple: Secondary | ICD-10-CM | POA: Diagnosis not present

## 2022-10-11 LAB — RAD ONC ARIA SESSION SUMMARY
Course Elapsed Days: 31
Plan Fractions Treated to Date: 11
Plan Fractions Treated to Date: 21
Plan Prescribed Dose Per Fraction: 1.8 Gy
Plan Prescribed Dose Per Fraction: 1.8 Gy
Plan Total Fractions Prescribed: 14
Plan Total Fractions Prescribed: 28
Plan Total Prescribed Dose: 25.2 Gy
Plan Total Prescribed Dose: 50.4 Gy
Reference Point Dosage Given to Date: 19.8 Gy
Reference Point Dosage Given to Date: 37.8 Gy
Reference Point Session Dosage Given: 1.8 Gy
Reference Point Session Dosage Given: 1.8 Gy
Session Number: 21

## 2022-10-14 ENCOUNTER — Other Ambulatory Visit: Payer: Self-pay

## 2022-10-14 ENCOUNTER — Ambulatory Visit
Admission: RE | Admit: 2022-10-14 | Discharge: 2022-10-14 | Disposition: A | Discharge status: Home / Self Care | Payer: Medicare Other | Source: Ambulatory Visit | Attending: Radiation Oncology | Admitting: Radiation Oncology

## 2022-10-14 ENCOUNTER — Ambulatory Visit: Payer: Medicare Other

## 2022-10-14 DIAGNOSIS — C50812 Malignant neoplasm of overlapping sites of left female breast: Secondary | ICD-10-CM | POA: Diagnosis not present

## 2022-10-14 DIAGNOSIS — Z9012 Acquired absence of left breast and nipple: Secondary | ICD-10-CM | POA: Diagnosis not present

## 2022-10-14 DIAGNOSIS — C50112 Malignant neoplasm of central portion of left female breast: Secondary | ICD-10-CM | POA: Diagnosis not present

## 2022-10-14 DIAGNOSIS — Z170 Estrogen receptor positive status [ER+]: Secondary | ICD-10-CM | POA: Diagnosis not present

## 2022-10-14 DIAGNOSIS — Z510 Encounter for antineoplastic radiation therapy: Secondary | ICD-10-CM | POA: Diagnosis not present

## 2022-10-14 LAB — RAD ONC ARIA SESSION SUMMARY
Course Elapsed Days: 34
Plan Fractions Treated to Date: 11
Plan Fractions Treated to Date: 22
Plan Prescribed Dose Per Fraction: 1.8 Gy
Plan Prescribed Dose Per Fraction: 1.8 Gy
Plan Total Fractions Prescribed: 14
Plan Total Fractions Prescribed: 28
Plan Total Prescribed Dose: 25.2 Gy
Plan Total Prescribed Dose: 50.4 Gy
Reference Point Dosage Given to Date: 19.8 Gy
Reference Point Dosage Given to Date: 39.6 Gy
Reference Point Session Dosage Given: 1.8 Gy
Reference Point Session Dosage Given: 1.8 Gy
Session Number: 22

## 2022-10-15 ENCOUNTER — Other Ambulatory Visit: Payer: Self-pay

## 2022-10-15 ENCOUNTER — Ambulatory Visit
Admission: RE | Admit: 2022-10-15 | Discharge: 2022-10-15 | Disposition: A | Discharge status: Home / Self Care | Payer: Medicare Other | Source: Ambulatory Visit | Attending: Radiation Oncology | Admitting: Radiation Oncology

## 2022-10-15 DIAGNOSIS — Z510 Encounter for antineoplastic radiation therapy: Secondary | ICD-10-CM | POA: Diagnosis not present

## 2022-10-15 DIAGNOSIS — Z9012 Acquired absence of left breast and nipple: Secondary | ICD-10-CM | POA: Diagnosis not present

## 2022-10-15 DIAGNOSIS — C50812 Malignant neoplasm of overlapping sites of left female breast: Secondary | ICD-10-CM | POA: Diagnosis not present

## 2022-10-15 DIAGNOSIS — Z170 Estrogen receptor positive status [ER+]: Secondary | ICD-10-CM | POA: Diagnosis not present

## 2022-10-15 DIAGNOSIS — C50112 Malignant neoplasm of central portion of left female breast: Secondary | ICD-10-CM | POA: Diagnosis not present

## 2022-10-15 LAB — RAD ONC ARIA SESSION SUMMARY
Course Elapsed Days: 35
Plan Fractions Treated to Date: 12
Plan Fractions Treated to Date: 23
Plan Prescribed Dose Per Fraction: 1.8 Gy
Plan Prescribed Dose Per Fraction: 1.8 Gy
Plan Total Fractions Prescribed: 14
Plan Total Fractions Prescribed: 28
Plan Total Prescribed Dose: 25.2 Gy
Plan Total Prescribed Dose: 50.4 Gy
Reference Point Dosage Given to Date: 21.6 Gy
Reference Point Dosage Given to Date: 41.4 Gy
Reference Point Session Dosage Given: 1.8 Gy
Reference Point Session Dosage Given: 1.8 Gy
Session Number: 23

## 2022-10-16 ENCOUNTER — Other Ambulatory Visit: Payer: Self-pay

## 2022-10-16 ENCOUNTER — Ambulatory Visit
Admission: RE | Admit: 2022-10-16 | Discharge: 2022-10-16 | Disposition: A | Discharge status: Home / Self Care | Payer: Medicare Other | Source: Ambulatory Visit | Attending: Radiation Oncology | Admitting: Radiation Oncology

## 2022-10-16 DIAGNOSIS — Z170 Estrogen receptor positive status [ER+]: Secondary | ICD-10-CM | POA: Diagnosis not present

## 2022-10-16 DIAGNOSIS — C50812 Malignant neoplasm of overlapping sites of left female breast: Secondary | ICD-10-CM | POA: Diagnosis not present

## 2022-10-16 DIAGNOSIS — Z9012 Acquired absence of left breast and nipple: Secondary | ICD-10-CM | POA: Diagnosis not present

## 2022-10-16 DIAGNOSIS — Z510 Encounter for antineoplastic radiation therapy: Secondary | ICD-10-CM | POA: Diagnosis not present

## 2022-10-16 DIAGNOSIS — C50112 Malignant neoplasm of central portion of left female breast: Secondary | ICD-10-CM | POA: Diagnosis not present

## 2022-10-16 LAB — RAD ONC ARIA SESSION SUMMARY
Course Elapsed Days: 36
Plan Fractions Treated to Date: 12
Plan Fractions Treated to Date: 24
Plan Prescribed Dose Per Fraction: 1.8 Gy
Plan Prescribed Dose Per Fraction: 1.8 Gy
Plan Total Fractions Prescribed: 14
Plan Total Fractions Prescribed: 28
Plan Total Prescribed Dose: 25.2 Gy
Plan Total Prescribed Dose: 50.4 Gy
Reference Point Dosage Given to Date: 21.6 Gy
Reference Point Dosage Given to Date: 43.2 Gy
Reference Point Session Dosage Given: 1.8 Gy
Reference Point Session Dosage Given: 1.8 Gy
Session Number: 24

## 2022-10-17 ENCOUNTER — Other Ambulatory Visit: Payer: Self-pay

## 2022-10-17 ENCOUNTER — Inpatient Hospital Stay: Payer: Medicare Other | Admitting: Hematology

## 2022-10-17 ENCOUNTER — Encounter: Payer: Self-pay | Admitting: Hematology

## 2022-10-17 ENCOUNTER — Ambulatory Visit
Admission: RE | Admit: 2022-10-17 | Discharge: 2022-10-17 | Disposition: A | Discharge status: Home / Self Care | Payer: Medicare Other | Source: Ambulatory Visit | Attending: Radiation Oncology | Admitting: Radiation Oncology

## 2022-10-17 VITALS — BP 130/66 | HR 105 | Temp 99.1°F | Resp 18 | Ht 66.0 in | Wt 192.1 lb

## 2022-10-17 DIAGNOSIS — C50112 Malignant neoplasm of central portion of left female breast: Secondary | ICD-10-CM | POA: Diagnosis not present

## 2022-10-17 DIAGNOSIS — Z9012 Acquired absence of left breast and nipple: Secondary | ICD-10-CM | POA: Insufficient documentation

## 2022-10-17 DIAGNOSIS — Z170 Estrogen receptor positive status [ER+]: Secondary | ICD-10-CM | POA: Insufficient documentation

## 2022-10-17 DIAGNOSIS — C50812 Malignant neoplasm of overlapping sites of left female breast: Secondary | ICD-10-CM | POA: Insufficient documentation

## 2022-10-17 DIAGNOSIS — Z510 Encounter for antineoplastic radiation therapy: Secondary | ICD-10-CM | POA: Diagnosis not present

## 2022-10-17 LAB — RAD ONC ARIA SESSION SUMMARY
Course Elapsed Days: 37
Plan Fractions Treated to Date: 13
Plan Fractions Treated to Date: 25
Plan Prescribed Dose Per Fraction: 1.8 Gy
Plan Prescribed Dose Per Fraction: 1.8 Gy
Plan Total Fractions Prescribed: 14
Plan Total Fractions Prescribed: 28
Plan Total Prescribed Dose: 25.2 Gy
Plan Total Prescribed Dose: 50.4 Gy
Reference Point Dosage Given to Date: 23.4 Gy
Reference Point Dosage Given to Date: 45 Gy
Reference Point Session Dosage Given: 1.8 Gy
Reference Point Session Dosage Given: 1.8 Gy
Session Number: 25

## 2022-10-17 MED ORDER — ANASTROZOLE 1 MG PO TABS: 1.0000 mg | ORAL_TABLET | Freq: Every day | ORAL | 3 refills | Status: DC

## 2022-10-17 NOTE — Progress Notes (Signed)
[facility]   [phone] [phone]   Clinic Follow up Note   Patient Care Team: Zola Button, Grayling Congress, DO as PCP - General Ranee Gosselin, MD as Consulting Physician (Orthopedic Surgery) Donnelly Angelica, RN as Oncology Nurse Navigator Pershing Proud, RN as Oncology Nurse Navigator Malachy Mood, MD as Consulting Physician (Hematology) Lonie Peak, MD as Attending Physician (Radiation Oncology) Griselda Miner, MD as Consulting Physician (General Surgery)  Date of Service:  10/17/2022  CHIEF COMPLAINT: f/u of left breast cancer  CURRENT THERAPY:    ASSESSMENT:  Diane Hodges is a 67 y.o. female with   Malignant neoplasm of overlapping sites of left breast in female, estrogen receptor positive (HCC) invasive lobular carcinoma, cT3N1M0, stage IIA, ER+/PR+/HER2-, G1  -diagnosed in 04/2022 -She underwent left breast mastectomy and tolerated lymph node biopsy in March 2024.  I reviewed her surgical pathology findings with her in detail -Her Oncotype showed recurrence score 2, predicts her risk of distant recurrence in the next 90 days 9%, no adjuvant chemotherapy is recommended.  I discussed the above with patient in detail today. -She is on adjuvant radiation, tolerating well, will complete next week --Given the strong ER and PR positivity, I do recommend adjuvant aromatase inhibitor to reduce her risk of cancer recurrence,  The potential benefit and side effects, which includes but not limited to, hot flash, skin and vaginal dryness, metabolic changes ( increased blood glucose, cholesterol, weight, etc.), slightly in increased risk of cardiovascular disease, cataracts, muscular and joint discomfort, osteopenia and osteoporosis, etc, were discussed with her in great details. She is interested, and we'll start after she completes radiation. -The goal of therapy is curative -Due to the risk of bleeding recurrence due to her lobular histology, I  recommend aromatase inhibitor for 10 years if she tolerates. -I will call you in the hospital today, she was started in a months  Bone health -She had a bone density scan in December 2023, which was normal -We discussed the negative impact on her bone density from aromatase inhibitor, and we will work monitor every 2 years   PLAN: -I recommend adjuvant aromatase inhibitor, I called in anastrozole to her pharmacy today, she will start taking in 3 to 4 weeks -Survivorship in 3 months -Lab and follow-up in 6 months   SUMMARY OF ONCOLOGIC HISTORY: Oncology History Overview Note   Cancer Staging  Malignant neoplasm of overlapping sites of left breast in female, estrogen receptor positive (HCC) Staging form: Breast, AJCC 8th Edition - Clinical stage from 04/08/2022: Stage Unknown (cTX, cN0, cM0, G1, ER+, PR+, HER2-) - Signed by Malachy Mood, MD on 04/16/2022 Stage prefix: Initial diagnosis Histologic grading system: 3 grade system     Malignant neoplasm of overlapping sites of left breast in female, estrogen receptor positive (HCC)  03/20/2022 Imaging    IMPRESSION: 1. Suspicious architectural distortion within the central/retroareolar LEFT breast, best seen on spot compression CC slice 29, without sonographic correlate. Stereotactic biopsy is recommended. 2. Additional possible subtle architectural distortion within the outer LEFT breast, best seen on spot compression CC slice 46, without sonographic correlate. Stereotactic biopsy is recommended.     04/08/2022 Procedure    FINDINGS: 3D Mammographic images were obtained following stereotactic core biopsies of left breast distortion. There is a ribbon and coil shaped clip in the upper aspect of the breast in appropriate position.     04/16/2022 Initial Diagnosis   Malignant neoplasm of overlapping sites of left breast in  female, estrogen receptor positive (HCC)      INTERVAL HISTORY:  Diane Hodges is here for a follow up  of left breast cancer. She was last seen by me on 06/07/2022. She presents to the clinic alone.  She completed left mastectomy and targeted lymph node biopsy in March 2024, she recovered well from surgery.  She is currently on adjuvant radiation, she has mild to moderate fatigue, skin redness, no other side effects.  She is doing well overall.   All other systems were reviewed with the patient and are negative.  MEDICAL HISTORY:  Past Medical History:  Diagnosis Date   Atypical nevus 04/12/2014   mid back -mild   DM (diabetes mellitus), type 2 (HCC)    Hyperlipemia    Hypertension    PONV (postoperative nausea and vomiting)    with one surgery   Thyroid disease    Hypothyridism    SURGICAL HISTORY: Past Surgical History:  Procedure Laterality Date   BREAST BIOPSY Left 04/08/2022   MM LT BREAST BX W LOC DEV EA AD LESION IMG BX SPEC STEREO GUIDE 04/08/2022 [facility]   BREAST BIOPSY Left 04/08/2022   MM LT BREAST BX W LOC DEV 1ST LESION IMAGE BX SPEC STEREO GUIDE 04/08/2022 [facility]   BREAST BIOPSY Left 06/21/2022   [place] LT RADIOACTIVE SEED LOC 06/21/2022 [facility]   BREAST RECONSTRUCTION WITH PLACEMENT OF TISSUE EXPANDER AND FLEX HD (ACELLULAR HYDRATED DERMIS) Left 06/24/2022   Procedure: IMMEDIATE LEFT BREAST RECONSTRUCTION WITH PLACEMENT OF TISSUE EXPANDER AND FLEX HD (ACELLULAR HYDRATED DERMIS);  Surgeon: Peggye Form, DO;  Location: [facility];  Service: Plastics;  Laterality: Left;   CATARACT EXTRACTION Right 2015   MASTECTOMY W/ SENTINEL NODE BIOPSY Left 06/24/2022   Procedure: LEFT MASTECTOMY WITH SENTINEL LYMPH NODE BIOPSY;  Surgeon: Griselda Miner, MD;  Location: [facility];  Service: General;  Laterality: Left;   OOPHORECTOMY  1980   RADIOACTIVE SEED GUIDED AXILLARY SENTINEL LYMPH NODE Left 06/24/2022   Procedure: RADIOACTIVE SEED GUIDED LEFT AXILLARY SENTINEL LYMPH NODE DISSECTION;  Surgeon: Griselda Miner, MD;  Location:  [facility];  Service: General;  Laterality: Left;   REMOVAL OF TISSUE EXPANDER AND PLACEMENT OF IMPLANT Left 08/12/2022   Procedure: REMOVAL OF TISSUE EXPANDER AND PLACEMENT OF IMPLANT;  Surgeon: Peggye Form, DO;  Location: [facility];  Service: Plastics;  Laterality: Left;    I have reviewed the social history and family history with the patient and they are unchanged from previous note.  ALLERGIES:  is allergic to other, azithromycin, and latex.  MEDICATIONS:  Current Outpatient Medications  Medication Sig Dispense Refill   anastrozole (ARIMIDEX) 1 MG tablet Take 1 tablet (1 mg total) by mouth daily. 30 tablet 3   ascorbic acid (VITAMIN C) 500 MG tablet Take 500 mg by mouth daily.     blood glucose meter kit and supplies KIT Dispense based on patient and insurance preference. Use up to four times daily as directed. (FOR ICD-9 250.00, 250.01). 1 each 0   carvedilol (COREG) 6.25 MG tablet Take 1 tablet (6.25 mg total) by mouth 2 (two) times daily with a meal. Needs appt 180 tablet 0   Cholecalciferol (VITAMIN D) 50 MCG (2000 UT) tablet Take 2,000 Units by mouth daily.     fenofibrate 54 MG tablet TAKE 1 TABLET BY MOUTH EVERY DAY 90 tablet 1   glucose blood test strip Onetouch verio test strips. Check blood sugar  once a day 100 each 0   levothyroxine (SYNTHROID) 112 MCG tablet Take 1 tablet (112 mcg total) by mouth daily before breakfast. 90 tablet 0   metFORMIN (GLUCOPHAGE) 500 MG tablet TAKE 1 TABLET BY MOUTH TWICE A DAY WITH FOOD 180 tablet 1   Multiple Vitamin (MULTIVITAMIN ADULT) TABS Take 1 tablet by mouth daily.     ONETOUCH DELICA LANCETS FINE MISC 1 Device by Does not apply route daily. 100 each 0   rosuvastatin (CRESTOR) 10 MG tablet TAKE 1 TABLET BY MOUTH EVERY DAY 90 tablet 0   Semaglutide,0.25 or 0.5MG /DOS, 2 MG/3ML SOPN Inject 0.25 mg into the skin once a week. Increase to 0.5mg  in one month. 3 mL 1   triamcinolone cream (KENALOG) 0.1 % Apply 1 Application  topically daily as needed.  4   No current facility-administered medications for this visit.    PHYSICAL EXAMINATION: ECOG PERFORMANCE STATUS: 1 - Symptomatic but completely ambulatory  Vitals:   10/17/22 1242  BP: 130/66  Pulse: (!) 105  Resp: 18  Temp: 99.1 F (37.3 C)  SpO2: 96%   Wt Readings from Last 3 Encounters:  10/17/22 192 lb 1.6 oz (87.1 kg)  09/17/22 194 lb (88 kg)  08/12/22 197 lb (89.4 kg)    GENERAL:alert, no distress and comfortable SKIN: skin color, texture, turgor are normal, no rashes or significant lesions, except diffuse skin redness in the left breast area  Musculoskeletal:no cyanosis of digits and no clubbing  NEURO: alert & oriented x 3 with fluent speech, no focal motor/sensory deficits  LABORATORY DATA:  I have reviewed the data as listed    Latest Ref Rng & Units 08/12/2022    9:18 AM 04/17/2022   12:23 PM 02/28/2022   11:34 AM  CBC  WBC 4.0 - 10.5 K/uL 9.9  8.1  6.7   Hemoglobin 12.0 - 15.0 g/dL 16.1  09.6  04.5   Hematocrit 36.0 - 46.0 % 36.1  40.2  41.1   Platelets 150 - 400 K/uL 205  221  210.0         Latest Ref Rng & Units 08/12/2022    9:18 AM 06/19/2022    1:30 PM 06/05/2022    9:56 AM  CMP  Glucose 70 - 99 mg/dL 409  811    BUN 8 - 23 mg/dL 5  7    Creatinine 9.14 - 1.00 mg/dL 7.82  9.56  2.13   Sodium 135 - 145 mmol/L 138  138    Potassium 3.5 - 5.1 mmol/L 3.6  4.1    Chloride 98 - 111 mmol/L 104  103    CO2 22 - 32 mmol/L 22  26    Calcium 8.9 - 10.3 mg/dL 8.8  9.0        RADIOGRAPHIC STUDIES: I have personally reviewed the radiological images as listed and agreed with the findings in the report. No results found.    No orders of the defined types were placed in this encounter.  All questions were answered. The patient knows to call the clinic with any problems, questions or concerns. No barriers to learning was detected. The total time spent in the appointment was 35 minutes.     Malachy Mood, MD 10/17/2022   Carolin Coy, CMA, am acting as scribe for Malachy Mood, MD.   I have reviewed the above documentation for accuracy and completeness, and I agree with the above.

## 2022-10-18 ENCOUNTER — Ambulatory Visit: Admission: RE | Admit: 2022-10-18 | Payer: Medicare Other | Source: Ambulatory Visit

## 2022-10-18 ENCOUNTER — Ambulatory Visit: Payer: Medicare Other

## 2022-10-21 ENCOUNTER — Encounter: Payer: Self-pay | Admitting: Family Medicine

## 2022-10-21 ENCOUNTER — Ambulatory Visit
Admission: RE | Admit: 2022-10-21 | Discharge: 2022-10-21 | Disposition: A | Discharge status: Home / Self Care | Payer: Medicare Other | Source: Ambulatory Visit | Attending: Radiation Oncology | Admitting: Radiation Oncology

## 2022-10-21 ENCOUNTER — Ambulatory Visit (INDEPENDENT_AMBULATORY_CARE_PROVIDER_SITE_OTHER): Payer: Medicare Other | Admitting: Family Medicine

## 2022-10-21 ENCOUNTER — Other Ambulatory Visit: Payer: Self-pay

## 2022-10-21 VITALS — BP 120/80 | HR 88 | Temp 97.7°F | Resp 18 | Ht 66.0 in | Wt 190.6 lb

## 2022-10-21 DIAGNOSIS — E1165 Type 2 diabetes mellitus with hyperglycemia: Secondary | ICD-10-CM | POA: Diagnosis not present

## 2022-10-21 DIAGNOSIS — C50112 Malignant neoplasm of central portion of left female breast: Secondary | ICD-10-CM

## 2022-10-21 DIAGNOSIS — Z170 Estrogen receptor positive status [ER+]: Secondary | ICD-10-CM | POA: Diagnosis not present

## 2022-10-21 DIAGNOSIS — C50812 Malignant neoplasm of overlapping sites of left female breast: Secondary | ICD-10-CM | POA: Diagnosis not present

## 2022-10-21 DIAGNOSIS — E1169 Type 2 diabetes mellitus with other specified complication: Secondary | ICD-10-CM

## 2022-10-21 DIAGNOSIS — E785 Hyperlipidemia, unspecified: Secondary | ICD-10-CM

## 2022-10-21 DIAGNOSIS — I10 Essential (primary) hypertension: Secondary | ICD-10-CM | POA: Diagnosis not present

## 2022-10-21 DIAGNOSIS — Z9012 Acquired absence of left breast and nipple: Secondary | ICD-10-CM | POA: Diagnosis not present

## 2022-10-21 DIAGNOSIS — Z1211 Encounter for screening for malignant neoplasm of colon: Secondary | ICD-10-CM

## 2022-10-21 DIAGNOSIS — E039 Hypothyroidism, unspecified: Secondary | ICD-10-CM

## 2022-10-21 DIAGNOSIS — Z510 Encounter for antineoplastic radiation therapy: Secondary | ICD-10-CM | POA: Diagnosis not present

## 2022-10-21 LAB — CBC WITH DIFFERENTIAL/PLATELET
Basophils Absolute: 0 10*3/uL (ref 0.0–0.1)
Basophils Relative: 0.9 % (ref 0.0–3.0)
Eosinophils Absolute: 0.1 10*3/uL (ref 0.0–0.7)
Eosinophils Relative: 3.7 % (ref 0.0–5.0)
HCT: 42.2 % (ref 36.0–46.0)
Hemoglobin: 13.5 g/dL (ref 12.0–15.0)
Lymphocytes Relative: 31.2 % (ref 12.0–46.0)
Lymphs Abs: 1 10*3/uL (ref 0.7–4.0)
MCHC: 32.1 g/dL (ref 30.0–36.0)
MCV: 91.4 fl (ref 78.0–100.0)
Monocytes Absolute: 0.4 10*3/uL (ref 0.1–1.0)
Monocytes Relative: 12 % (ref 3.0–12.0)
Neutro Abs: 1.7 10*3/uL (ref 1.4–7.7)
Neutrophils Relative %: 52.2 % (ref 43.0–77.0)
Platelets: 184 10*3/uL (ref 150.0–400.0)
RBC: 4.61 Mil/uL (ref 3.87–5.11)
RDW: 14.5 % (ref 11.5–15.5)
WBC: 3.2 10*3/uL — ABNORMAL LOW (ref 4.0–10.5)

## 2022-10-21 LAB — LIPID PANEL
Cholesterol: 106 mg/dL (ref 0–200)
HDL: 46.2 mg/dL (ref >39.00)
LDL Cholesterol: 29 mg/dL (ref 0–99)
NonHDL: 60.06
Total CHOL/HDL Ratio: 2
Triglycerides: 155 mg/dL — ABNORMAL HIGH (ref 0.0–149.0)
VLDL: 31 mg/dL (ref 0.0–40.0)

## 2022-10-21 LAB — RAD ONC ARIA SESSION SUMMARY
Course Elapsed Days: 41
Plan Fractions Treated to Date: 13
Plan Fractions Treated to Date: 26
Plan Prescribed Dose Per Fraction: 1.8 Gy
Plan Prescribed Dose Per Fraction: 1.8 Gy
Plan Total Fractions Prescribed: 14
Plan Total Fractions Prescribed: 28
Plan Total Prescribed Dose: 25.2 Gy
Plan Total Prescribed Dose: 50.4 Gy
Reference Point Dosage Given to Date: 23.4 Gy
Reference Point Dosage Given to Date: 46.8 Gy
Reference Point Session Dosage Given: 1.8 Gy
Reference Point Session Dosage Given: 1.8 Gy
Session Number: 26

## 2022-10-21 LAB — MICROALBUMIN / CREATININE URINE RATIO
Creatinine,U: 208.2 mg/dL
Microalb Creat Ratio: 2.5 mg/g (ref 0.0–30.0)
Microalb, Ur: 5.2 mg/dL — ABNORMAL HIGH (ref 0.0–1.9)

## 2022-10-21 LAB — TSH: TSH: 0.63 u[IU]/mL (ref 0.35–5.50)

## 2022-10-21 LAB — COMPREHENSIVE METABOLIC PANEL
ALT: 18 U/L (ref 0–35)
AST: 20 U/L (ref 0–37)
Albumin: 4.8 g/dL (ref 3.5–5.2)
Alkaline Phosphatase: 56 U/L (ref 39–117)
BUN: 8 mg/dL (ref 6–23)
CO2: 25 mEq/L (ref 19–32)
Calcium: 10 mg/dL (ref 8.4–10.5)
Chloride: 104 mEq/L (ref 96–112)
Creatinine, Ser: 0.76 mg/dL (ref 0.40–1.20)
GFR: 80.4 mL/min (ref >60.00)
Glucose, Bld: 124 mg/dL — ABNORMAL HIGH (ref 70–99)
Potassium: 4 mEq/L (ref 3.5–5.1)
Sodium: 142 mEq/L (ref 135–145)
Total Bilirubin: 0.4 mg/dL (ref 0.2–1.2)
Total Protein: 7.2 g/dL (ref 6.0–8.3)

## 2022-10-21 LAB — HEMOGLOBIN A1C: Hgb A1c MFr Bld: 6.6 % — ABNORMAL HIGH (ref 4.6–6.5)

## 2022-10-21 MED ORDER — TRIAMCINOLONE ACETONIDE 0.1 % EX CREA: 1.0000 | TOPICAL_CREAM | Freq: Every day | CUTANEOUS | 4 refills | Status: AC | PRN

## 2022-10-21 NOTE — Progress Notes (Signed)
Established Patient Office Visit  Subjective   Patient ID: Diane Hodges, female    DOB: 03-15-1956  Age: 67 y.o. MRN: [id number]  Chief Complaint  Patient presents with   Diabetes   Hyperlipidemia   Hypertension   Hypothyroidism   Follow-up    HPI Discussed the use of AI scribe software for clinical note transcription with the patient, who gave verbal consent to proceed.  History of Present Illness   The patient presents with multiple health concerns. She reports receiving multiple messages regarding changes in her appointment schedule due to a recent disruption in hospital services. She is currently undergoing radiation treatment for cancer, which was supposed to end tomorrow, but due to a missed session, she has an additional session scheduled for Wednesday. She reports skin sensitivity and redness due to the radiation, particularly under the arm, which she is managing with a soothing gel provided by the radiologist.  The patient also reports bowel irregularities, which she believes may be due to irritable bowel syndrome. She has not completed a Cologuard test for colon cancer screening due to loose stools and has not sought consultation with a gastroenterologist. She expresses a desire to monitor her condition before seeking further medical intervention.  Additionally, the patient has a skin condition which she manages with a cream prescribed by a now-retired dermatologist. She requests a refill of this medication. She also mentions starting a new medication for her cancer treatment, expressing concern about potential interactions with her current medications.  The patient also mentions discomfort from a recent implant following a mastectomy, expressing dissatisfaction with the result and discomfort on the side where a drain was placed. She has an upcoming appointment with her plastic surgeon.      Patient Active Problem List   Diagnosis Date Noted   Breast cancer (HCC)  06/24/2022   Carcinoma of central portion of left breast in female, estrogen receptor positive (HCC) 04/17/2022   Malignant neoplasm of overlapping sites of left breast in female, estrogen receptor positive (HCC) 04/16/2022   Preventative health care 07/30/2021   Uncontrolled type 2 diabetes mellitus with hyperglycemia (HCC) 06/26/2020   Anxiety 02/20/2013   Obesity (BMI 30-39.9) 02/19/2013   Hyperlipidemia 10/16/2009   Essential hypertension 12/26/2008   ALLERGIC RHINITIS 11/12/2007   COMMON MIGRAINE 12/08/2006   SKIN RASH 12/08/2006   Hypothyroidism 10/20/2006   BRONCHITIS, ACUTE 10/20/2006   Past Medical History:  Diagnosis Date   Atypical nevus 04/12/2014   mid back -mild   DM (diabetes mellitus), type 2 (HCC)    Hyperlipemia    Hypertension    PONV (postoperative nausea and vomiting)    with one surgery   Thyroid disease    Hypothyridism   Past Surgical History:  Procedure Laterality Date   BREAST BIOPSY Left 04/08/2022   MM LT BREAST BX W LOC DEV EA AD LESION IMG BX SPEC STEREO GUIDE 04/08/2022 [facility]   BREAST BIOPSY Left 04/08/2022   MM LT BREAST BX W LOC DEV 1ST LESION IMAGE BX SPEC STEREO GUIDE 04/08/2022 [facility]   BREAST BIOPSY Left 06/21/2022   [place] LT RADIOACTIVE SEED LOC 06/21/2022 [facility]   BREAST RECONSTRUCTION WITH PLACEMENT OF TISSUE EXPANDER AND FLEX HD (ACELLULAR HYDRATED DERMIS) Left 06/24/2022   Procedure: IMMEDIATE LEFT BREAST RECONSTRUCTION WITH PLACEMENT OF TISSUE EXPANDER AND FLEX HD (ACELLULAR HYDRATED DERMIS);  Surgeon: Peggye Form, DO;  Location: [facility];  Service: Plastics;  Laterality: Left;   CATARACT EXTRACTION Right 2015  MASTECTOMY W/ SENTINEL NODE BIOPSY Left 06/24/2022   Procedure: LEFT MASTECTOMY WITH SENTINEL LYMPH NODE BIOPSY;  Surgeon: Griselda Miner, MD;  Location: [facility];  Service: General;  Laterality: Left;   OOPHORECTOMY  1980   RADIOACTIVE SEED GUIDED AXILLARY  SENTINEL LYMPH NODE Left 06/24/2022   Procedure: RADIOACTIVE SEED GUIDED LEFT AXILLARY SENTINEL LYMPH NODE DISSECTION;  Surgeon: Griselda Miner, MD;  Location: [facility];  Service: General;  Laterality: Left;   REMOVAL OF TISSUE EXPANDER AND PLACEMENT OF IMPLANT Left 08/12/2022   Procedure: REMOVAL OF TISSUE EXPANDER AND PLACEMENT OF IMPLANT;  Surgeon: Peggye Form, DO;  Location: [facility];  Service: Plastics;  Laterality: Left;   Social History   Tobacco Use   Smoking status: Never   Smokeless tobacco: Never  Vaping Use   Vaping status: Never Used  Substance Use Topics   Alcohol use: No   Drug use: No   Social History   Socioeconomic History   Marital status: Married    Spouse name: Not on file   Number of children: 2   Years of education: Not on file   Highest education level: Not on file  Occupational History   Occupation: credit reporting    Employer: [employer]  Tobacco Use   Smoking status: Never   Smokeless tobacco: Never  Vaping Use   Vaping status: Never Used  Substance and Sexual Activity   Alcohol use: No   Drug use: No   Sexual activity: Yes    Partners: Male    Birth control/protection: Post-menopausal  Other Topics Concern   Not on file  Social History Narrative   Exercise-- none   Social Determinants of Health   Financial Resource Strain: Low Risk  (10/17/2022)   Overall Financial Resource Strain (CARDIA)    Difficulty of Paying Living Expenses: Not hard at all  Food Insecurity: No Food Insecurity (10/17/2022)   Hunger Vital Sign    Worried About Running Out of Food in the Last Year: Never true    Ran Out of Food in the Last Year: Never true  Transportation Needs: No Transportation Needs (10/17/2022)   PRAPARE - Administrator, Civil Service (Medical): No    Lack of Transportation (Non-Medical): No  Physical Activity: Insufficiently Active (10/17/2022)   Exercise Vital Sign    Days of Exercise per Week: 2 days     Minutes of Exercise per Session: 10 min  Stress: No Stress Concern Present (10/17/2022)   [employer] of Occupational Health - Occupational Stress Questionnaire    Feeling of Stress : Not at all  Social Connections: Socially Integrated (10/17/2022)   Social Connection and Isolation Panel [NHANES]    Frequency of Communication with Friends and Family: More than three times a week    Frequency of Social Gatherings with Friends and Family: More than three times a week    Attends Religious Services: More than 4 times per year    Active Member of [employer] or Organizations: Yes    Attends Banker Meetings: 1 to 4 times per year    Marital Status: Married  Catering manager Violence: Not At Risk (02/13/2022)   Humiliation, Afraid, Rape, and Kick questionnaire    Fear of Current or Ex-Partner: No    Emotionally Abused: No    Physically Abused: No    Sexually Abused: No   Family Status  Relation Name Status   Mother  Alive   Father  Alive   Sister  Alive   Brother  Alive   Brother  Alive   Other  (Not Specified)  No partnership data on file   Family History  Problem Relation Age of Onset   Cancer Mother        breast cancer or precancer   Hypertension Mother    Hypertension Father    Melanoma Father 61   Thyroid disease Other    Allergies  Allergen Reactions   Other Other (See Comments)    Adhesive Strips causes blisters   Azithromycin Rash   Latex Rash      Review of Systems  Constitutional:  Negative for fever and malaise/fatigue.  HENT:  Negative for congestion.   Eyes:  Negative for blurred vision.  Respiratory:  Negative for shortness of breath.   Cardiovascular:  Negative for chest pain, palpitations and leg swelling.  Gastrointestinal:  Negative for abdominal pain, blood in stool and nausea.  Genitourinary:  Negative for dysuria and frequency.  Musculoskeletal:  Negative for falls.  Skin:  Negative for rash.  Neurological:  Negative for dizziness,  loss of consciousness and headaches.  Endo/Heme/Allergies:  Negative for environmental allergies.  Psychiatric/Behavioral:  Negative for depression. The patient is not nervous/anxious.       Objective:     BP 120/80 (BP Location: Right Arm, Patient Position: Sitting, Cuff Size: Large)   Pulse 88   Temp 97.7 F (36.5 C) (Oral)   Resp 18   Ht 5\' 6"  (1.676 m)   Wt 190 lb 9.6 oz (86.5 kg)   SpO2 98%   BMI 30.76 kg/m  BP Readings from Last 3 Encounters:  10/21/22 120/80  10/17/22 130/66  09/17/22 123/75   Wt Readings from Last 3 Encounters:  10/21/22 190 lb 9.6 oz (86.5 kg)  10/17/22 192 lb 1.6 oz (87.1 kg)  09/17/22 194 lb (88 kg)   SpO2 Readings from Last 3 Encounters:  10/21/22 98%  10/17/22 96%  09/17/22 95%      Physical Exam Vitals and nursing note reviewed.  Constitutional:      General: She is not in acute distress.    Appearance: Normal appearance. She is well-developed.  HENT:     Head: Normocephalic and atraumatic.     Right Ear: Tympanic membrane, ear canal and external ear normal. There is no impacted cerumen.     Left Ear: Tympanic membrane, ear canal and external ear normal. There is no impacted cerumen.     Nose: Nose normal.     Mouth/Throat:     Mouth: Mucous membranes are moist.     Pharynx: Oropharynx is clear. No oropharyngeal exudate or posterior oropharyngeal erythema.  Eyes:     General: No scleral icterus.       Right eye: No discharge.        Left eye: No discharge.     Conjunctiva/sclera: Conjunctivae normal.     Pupils: Pupils are equal, round, and reactive to light.  Neck:     Thyroid: No thyromegaly or thyroid tenderness.     Vascular: No JVD.  Cardiovascular:     Rate and Rhythm: Normal rate and regular rhythm.     Heart sounds: Normal heart sounds. No murmur heard. Pulmonary:     Effort: Pulmonary effort is normal. No respiratory distress.     Breath sounds: Normal breath sounds.  Abdominal:     General: Bowel sounds are  normal. There is no distension.     Palpations: Abdomen is  soft. There is no mass.     Tenderness: There is no abdominal tenderness. There is no guarding or rebound.  Genitourinary:    Vagina: Normal.  Musculoskeletal:        General: Normal range of motion.     Cervical back: Normal range of motion and neck supple.     Right lower leg: No edema.     Left lower leg: No edema.  Lymphadenopathy:     Cervical: No cervical adenopathy.  Skin:    General: Skin is warm and dry.     Findings: No erythema or rash.  Neurological:     Mental Status: She is alert and oriented to person, place, and time.     Cranial Nerves: No cranial nerve deficit.     Deep Tendon Reflexes: Reflexes are normal and symmetric.  Psychiatric:        Mood and Affect: Mood normal.        Behavior: Behavior normal.        Thought Content: Thought content normal.        Judgment: Judgment normal.      No results found for any visits on 10/21/22.  Last CBC Lab Results  Component Value Date   WBC 9.9 08/12/2022   HGB 11.6 (L) 08/12/2022   HCT 36.1 08/12/2022   MCV 93.5 08/12/2022   MCH 30.1 08/12/2022   RDW 12.4 08/12/2022   PLT 205 08/12/2022   Last metabolic panel Lab Results  Component Value Date   GLUCOSE 123 (H) 08/12/2022   NA 138 08/12/2022   K 3.6 08/12/2022   CL 104 08/12/2022   CO2 22 08/12/2022   BUN 5 (L) 08/12/2022   CREATININE 0.65 08/12/2022   GFRNONAA >60 08/12/2022   CALCIUM 8.8 (L) 08/12/2022   PROT 7.3 04/17/2022   ALBUMIN 4.6 04/17/2022   BILITOT 0.5 04/17/2022   ALKPHOS 62 04/17/2022   AST 17 04/17/2022   ALT 16 04/17/2022   ANIONGAP 12 08/12/2022   Last lipids Lab Results  Component Value Date   CHOL 103 02/28/2022   HDL 49.90 02/28/2022   LDLCALC 32 02/28/2022   LDLDIRECT 47.0 02/07/2021   TRIG 106.0 02/28/2022   CHOLHDL 2 02/28/2022   Last hemoglobin A1c Lab Results  Component Value Date   HGBA1C 6.8 (H) 02/28/2022   Last thyroid functions Lab Results   Component Value Date   TSH 1.60 02/28/2022   Last vitamin D No results found for: "25OHVITD2", "25OHVITD3", "VD25OH" Last vitamin B12 and Folate No results found for: "VITAMINB12", "FOLATE"    The ASCVD Risk score (Arnett DK, et al., 2019) failed to calculate for the following reasons:   The valid total cholesterol range is 130 to 320 mg/dL    Assessment & Plan:   Problem List Items Addressed This Visit       Unprioritized   Hypothyroidism    Check labs today      Relevant Orders   TSH   Hyperlipidemia    Tolerating statin, encouraged heart healthy diet, avoid trans fats, minimize simple carbs and saturated fats. Increase exercise as tolerated       Essential hypertension    Well controlled, no changes to meds. Encouraged heart healthy diet such as the DASH diet and exercise as tolerated.        Carcinoma of central portion of left breast in female, estrogen receptor positive [facility])    Per oncology      Other Visit Diagnoses  Type 2 diabetes mellitus with hyperglycemia, without long-term current use of insulin (HCC)    -  Primary   Relevant Orders   Comprehensive metabolic panel   Hemoglobin A1c   Microalbumin / creatinine urine ratio   Hyperlipidemia associated with type 2 diabetes mellitus (HCC)       Relevant Orders   Lipid panel   CBC with Differential/Platelet   Comprehensive metabolic panel   Colon cancer screening       Relevant Orders   Cologuard       No follow-ups on file.    Donato Schultz, DO

## 2022-10-21 NOTE — Assessment & Plan Note (Signed)
Well controlled, no changes to meds. Encouraged heart healthy diet such as the DASH diet and exercise as tolerated.  °

## 2022-10-21 NOTE — Assessment & Plan Note (Signed)
Check labs today.

## 2022-10-21 NOTE — Assessment & Plan Note (Signed)
Tolerating statin, encouraged heart healthy diet, avoid trans fats, minimize simple carbs and saturated fats. Increase exercise as tolerated 

## 2022-10-21 NOTE — Assessment & Plan Note (Signed)
Per oncology °

## 2022-10-22 ENCOUNTER — Other Ambulatory Visit: Payer: Self-pay

## 2022-10-22 ENCOUNTER — Ambulatory Visit
Admission: RE | Admit: 2022-10-22 | Discharge: 2022-10-22 | Disposition: A | Discharge status: Home / Self Care | Payer: Medicare Other | Source: Ambulatory Visit | Attending: Radiation Oncology | Admitting: Radiation Oncology

## 2022-10-22 ENCOUNTER — Other Ambulatory Visit: Payer: Self-pay | Admitting: Family Medicine

## 2022-10-22 ENCOUNTER — Ambulatory Visit: Payer: Medicare Other

## 2022-10-22 DIAGNOSIS — C50112 Malignant neoplasm of central portion of left female breast: Secondary | ICD-10-CM | POA: Diagnosis not present

## 2022-10-22 DIAGNOSIS — Z170 Estrogen receptor positive status [ER+]: Secondary | ICD-10-CM | POA: Diagnosis not present

## 2022-10-22 DIAGNOSIS — C50812 Malignant neoplasm of overlapping sites of left female breast: Secondary | ICD-10-CM | POA: Diagnosis not present

## 2022-10-22 DIAGNOSIS — Z9012 Acquired absence of left breast and nipple: Secondary | ICD-10-CM | POA: Diagnosis not present

## 2022-10-22 DIAGNOSIS — Z510 Encounter for antineoplastic radiation therapy: Secondary | ICD-10-CM | POA: Diagnosis not present

## 2022-10-22 LAB — RAD ONC ARIA SESSION SUMMARY
Course Elapsed Days: 42
Plan Fractions Treated to Date: 14
Plan Fractions Treated to Date: 27
Plan Prescribed Dose Per Fraction: 1.8 Gy
Plan Prescribed Dose Per Fraction: 1.8 Gy
Plan Total Fractions Prescribed: 14
Plan Total Fractions Prescribed: 28
Plan Total Prescribed Dose: 25.2 Gy
Plan Total Prescribed Dose: 50.4 Gy
Reference Point Dosage Given to Date: 25.2 Gy
Reference Point Dosage Given to Date: 48.6 Gy
Reference Point Session Dosage Given: 1.8 Gy
Reference Point Session Dosage Given: 1.8 Gy
Session Number: 27

## 2022-10-23 ENCOUNTER — Other Ambulatory Visit: Payer: Self-pay

## 2022-10-23 ENCOUNTER — Ambulatory Visit: Admission: RE | Admit: 2022-10-23 | Payer: Medicare Other | Source: Ambulatory Visit

## 2022-10-23 DIAGNOSIS — Z510 Encounter for antineoplastic radiation therapy: Secondary | ICD-10-CM | POA: Diagnosis not present

## 2022-10-23 DIAGNOSIS — C50112 Malignant neoplasm of central portion of left female breast: Secondary | ICD-10-CM | POA: Diagnosis not present

## 2022-10-23 DIAGNOSIS — Z170 Estrogen receptor positive status [ER+]: Secondary | ICD-10-CM | POA: Diagnosis not present

## 2022-10-23 DIAGNOSIS — Z9012 Acquired absence of left breast and nipple: Secondary | ICD-10-CM | POA: Diagnosis not present

## 2022-10-23 DIAGNOSIS — C50812 Malignant neoplasm of overlapping sites of left female breast: Secondary | ICD-10-CM | POA: Diagnosis not present

## 2022-10-23 LAB — RAD ONC ARIA SESSION SUMMARY
Course Elapsed Days: 43
Plan Fractions Treated to Date: 14
Plan Fractions Treated to Date: 28
Plan Prescribed Dose Per Fraction: 1.8 Gy
Plan Prescribed Dose Per Fraction: 1.8 Gy
Plan Total Fractions Prescribed: 14
Plan Total Fractions Prescribed: 28
Plan Total Prescribed Dose: 25.2 Gy
Plan Total Prescribed Dose: 50.4 Gy
Reference Point Dosage Given to Date: 25.2 Gy
Reference Point Dosage Given to Date: 50.4 Gy
Reference Point Session Dosage Given: 1.8 Gy
Reference Point Session Dosage Given: 1.8 Gy
Session Number: 28

## 2022-10-24 DIAGNOSIS — C50812 Malignant neoplasm of overlapping sites of left female breast: Secondary | ICD-10-CM | POA: Diagnosis not present

## 2022-10-24 DIAGNOSIS — Z170 Estrogen receptor positive status [ER+]: Secondary | ICD-10-CM | POA: Diagnosis not present

## 2022-10-26 ENCOUNTER — Other Ambulatory Visit: Payer: Self-pay | Admitting: Family Medicine

## 2022-10-26 DIAGNOSIS — E785 Hyperlipidemia, unspecified: Secondary | ICD-10-CM

## 2022-11-01 NOTE — Radiation Completion Notes (Signed)
Patient Name: Diane Hodges, Diane Hodges MRN: [id number] Date of Birth: 10-22-1955 Referring Physician: Loreen Freud, M.D. Date of Service: 2022-11-01 Radiation Oncologist: Lonie Peak, M.D. [facility]                             RADIATION ONCOLOGY END OF TREATMENT NOTE     Diagnosis: C50.812 Malignant neoplasm of overlapping sites of left female breast Staging on 2022-06-24: Malignant neoplasm of overlapping sites of left breast in female, estrogen receptor positive (HCC) T=pT3, N=pN1a, M=cM0 Staging on 2022-04-08: Malignant neoplasm of overlapping sites of left breast in female, estrogen receptor positive (HCC) T=cT3, N=cN1, M=cM0 Intent: Curative     ==========DELIVERED PLANS==========  First Treatment Date: 2022-09-10 - Last Treatment Date: 2022-10-23   Plan Name: CW_L_BO_IMN_B Site: Chest Wall, Left Technique: 3D Mode: Photon Dose Per Fraction: 1.8 Gy Prescribed Dose (Delivered / Prescribed): 25.2 Gy / 25.2 Gy Prescribed Fxs (Delivered / Prescribed): 14 / 14   Plan Name: CW_L_SCV_PAB Site: Chest Wall, Left Technique: 3D Mode: Photon Dose Per Fraction: 1.8 Gy Prescribed Dose (Delivered / Prescribed): 50.4 Gy / 50.4 Gy Prescribed Fxs (Delivered / Prescribed): 28 / 28   Plan Name: CW_L_IMN_BH Site: Chest Wall, Left Technique: 3D Mode: Photon Dose Per Fraction: 1.8 Gy Prescribed Dose (Delivered / Prescribed): 25.2 Gy / 25.2 Gy Prescribed Fxs (Delivered / Prescribed): 14 / 14     ==========ON TREATMENT VISIT DATES========== 2022-09-16, 2022-09-23, 2022-10-01, 2022-10-04, 2022-10-14, 2022-10-21     ==========UPCOMING VISITS==========       ==========APPENDIX - ON TREATMENT VISIT NOTES==========   See weekly On Treatment Notes in Epic for details.

## 2022-11-14 NOTE — Progress Notes (Signed)
Diane Hodges presents today for follow-up after completing radiation to her left breast on  10-23-22.   Pain: denies at this time Skin: healing well, using vitamin E oil, encouraged use for two months Fatigue: remains but improving, pt is looking into getting into exercise classes at the [facility] to help boost her energy level ROM: normal range of motion Lymphedema: none to report MedOnc F/U: Santiago Glad on 01-07-23 Other issues of note: Pt had no major questions or concerns. She was grateful for the care she received. She wanted to thank Dr. Basilio Cairo specifically for her care as well.   Pt reports Yes No Comments  Tamoxifen []  [x]    Letrozole []  [x]    Anastrazole [x]  []  1mg   Mammogram []  Date:  []  Encouraged yearly mammogram.

## 2022-11-24 ENCOUNTER — Other Ambulatory Visit: Payer: Self-pay | Admitting: Family Medicine

## 2022-11-24 DIAGNOSIS — I10 Essential (primary) hypertension: Secondary | ICD-10-CM

## 2022-11-27 ENCOUNTER — Other Ambulatory Visit: Payer: Self-pay | Admitting: Family Medicine

## 2022-11-27 ENCOUNTER — Other Ambulatory Visit: Payer: Self-pay | Admitting: Hematology

## 2022-11-27 DIAGNOSIS — E785 Hyperlipidemia, unspecified: Secondary | ICD-10-CM

## 2022-11-28 ENCOUNTER — Telehealth: Payer: Self-pay

## 2022-11-28 ENCOUNTER — Ambulatory Visit
Admission: RE | Admit: 2022-11-28 | Discharge: 2022-11-28 | Disposition: A | Discharge status: Home / Self Care | Payer: Medicare Other | Source: Ambulatory Visit | Attending: Radiation Oncology | Admitting: Radiation Oncology

## 2022-11-28 NOTE — Telephone Encounter (Signed)
Rn called pt for telephone follow up. Pt doing well overall. Note completed and routed to Dr. Basilio Cairo.

## 2022-12-09 ENCOUNTER — Ambulatory Visit: Payer: Medicare Other | Attending: General Surgery

## 2022-12-09 VITALS — Wt 191.1 lb

## 2022-12-09 DIAGNOSIS — Z483 Aftercare following surgery for neoplasm: Secondary | ICD-10-CM | POA: Insufficient documentation

## 2022-12-09 NOTE — Therapy (Signed)
OUTPATIENT PHYSICAL THERAPY SOZO SCREENING NOTE   Patient Name: Diane Hodges MRN: [id number] DOB:1955/07/20, 67 y.o., female Today's Date: 12/09/2022  PCP: Donato Schultz, DO REFERRING PROVIDER: Griselda Miner, MD   PT End of Session - 12/09/22 1537     Visit Number 4   # unchanged due to screen only   PT Start Time 1535    PT Stop Time 1539    PT Time Calculation (min) 4 min    Activity Tolerance Patient tolerated treatment well    Behavior During Therapy [facility] for tasks assessed/performed             Past Medical History:  Diagnosis Date   Atypical nevus 04/12/2014   mid back -mild   DM (diabetes mellitus), type 2 (HCC)    Hyperlipemia    Hypertension    PONV (postoperative nausea and vomiting)    with one surgery   Thyroid disease    Hypothyridism   Past Surgical History:  Procedure Laterality Date   BREAST BIOPSY Left 04/08/2022   MM LT BREAST BX W LOC DEV EA AD LESION IMG BX SPEC STEREO GUIDE 04/08/2022 [facility]   BREAST BIOPSY Left 04/08/2022   MM LT BREAST BX W LOC DEV 1ST LESION IMAGE BX SPEC STEREO GUIDE 04/08/2022 [facility]   BREAST BIOPSY Left 06/21/2022   [place] LT RADIOACTIVE SEED LOC 06/21/2022 [facility]   BREAST RECONSTRUCTION WITH PLACEMENT OF TISSUE EXPANDER AND FLEX HD (ACELLULAR HYDRATED DERMIS) Left 06/24/2022   Procedure: IMMEDIATE LEFT BREAST RECONSTRUCTION WITH PLACEMENT OF TISSUE EXPANDER AND FLEX HD (ACELLULAR HYDRATED DERMIS);  Surgeon: Peggye Form, DO;  Location: [facility];  Service: Plastics;  Laterality: Left;   CATARACT EXTRACTION Right 2015   MASTECTOMY W/ SENTINEL NODE BIOPSY Left 06/24/2022   Procedure: LEFT MASTECTOMY WITH SENTINEL LYMPH NODE BIOPSY;  Surgeon: Griselda Miner, MD;  Location: [facility];  Service: General;  Laterality: Left;   OOPHORECTOMY  1980   RADIOACTIVE SEED GUIDED AXILLARY SENTINEL LYMPH NODE Left 06/24/2022   Procedure: RADIOACTIVE SEED GUIDED LEFT  AXILLARY SENTINEL LYMPH NODE DISSECTION;  Surgeon: Griselda Miner, MD;  Location: [facility];  Service: General;  Laterality: Left;   REMOVAL OF TISSUE EXPANDER AND PLACEMENT OF IMPLANT Left 08/12/2022   Procedure: REMOVAL OF TISSUE EXPANDER AND PLACEMENT OF IMPLANT;  Surgeon: Peggye Form, DO;  Location: [facility];  Service: Plastics;  Laterality: Left;   Patient Active Problem List   Diagnosis Date Noted   Breast cancer (HCC) 06/24/2022   Carcinoma of central portion of left breast in female, estrogen receptor positive (HCC) 04/17/2022   Malignant neoplasm of overlapping sites of left breast in female, estrogen receptor positive (HCC) 04/16/2022   Preventative health care 07/30/2021   Uncontrolled type 2 diabetes mellitus with hyperglycemia (HCC) 06/26/2020   Anxiety 02/20/2013   Obesity (BMI 30-39.9) 02/19/2013   Hyperlipidemia 10/16/2009   Essential hypertension 12/26/2008   ALLERGIC RHINITIS 11/12/2007   COMMON MIGRAINE 12/08/2006   SKIN RASH 12/08/2006   Hypothyroidism 10/20/2006   BRONCHITIS, ACUTE 10/20/2006    REFERRING DIAG: left breast cancer at risk for lymphedema  THERAPY DIAG: Aftercare following surgery for neoplasm  PERTINENT HISTORY: Patient was diagnosed on 03/05/2022 with left grade 1 invasive lobular carcinoma breast cancer. She underwent a left mastectomy and axillary lymph node dissection on 06/24/2022. It is ER/PR positive and HER2 negative with a Ki67 of 2%. 1/4 LN's   PRECAUTIONS:  left UE Lymphedema risk  SUBJECTIVE: Here for SOZO screen  PAIN:  Are you having pain? No  SOZO SCREENING: Patient was assessed today using the SOZO machine to determine the lymphedema index score. This was compared to her baseline score. It was determined that she is within the recommended range when compared to her baseline and no further action is needed at this time. She will continue SOZO screenings. These are done every 3 months for 2 years post operatively  followed by every 6 months for 2 years, and then annually.   L-DEX FLOWSHEETS - 12/09/22 1500       L-DEX LYMPHEDEMA SCREENING   Measurement Type Unilateral    L-DEX MEASUREMENT EXTREMITY Upper Extremity    POSITION  Standing    DOMINANT SIDE Right    At Risk Side Left    BASELINE SCORE (UNILATERAL) -0.1    L-DEX SCORE (UNILATERAL) -2.8    VALUE CHANGE (UNILAT) -2.7            Berna Spare, PTA 12/09/22 3:39 PM

## 2022-12-18 ENCOUNTER — Other Ambulatory Visit: Payer: Self-pay | Admitting: Family Medicine

## 2022-12-18 DIAGNOSIS — E039 Hypothyroidism, unspecified: Secondary | ICD-10-CM

## 2022-12-30 ENCOUNTER — Telehealth: Payer: Self-pay | Admitting: Nurse Practitioner

## 2022-12-31 ENCOUNTER — Telehealth: Payer: Self-pay | Admitting: Nurse Practitioner

## 2023-01-07 ENCOUNTER — Encounter: Payer: Medicare Other | Admitting: Nurse Practitioner

## 2023-01-07 ENCOUNTER — Inpatient Hospital Stay: Payer: Medicare Other | Admitting: Nurse Practitioner

## 2023-01-14 ENCOUNTER — Encounter: Payer: Self-pay | Admitting: Nurse Practitioner

## 2023-01-14 ENCOUNTER — Inpatient Hospital Stay: Payer: Medicare Other | Attending: Radiation Oncology | Admitting: Nurse Practitioner

## 2023-01-14 VITALS — BP 120/64 | HR 86 | Temp 97.6°F | Resp 18 | Ht 66.0 in | Wt 192.5 lb

## 2023-01-14 DIAGNOSIS — Z87891 Personal history of nicotine dependence: Secondary | ICD-10-CM | POA: Insufficient documentation

## 2023-01-14 DIAGNOSIS — C50812 Malignant neoplasm of overlapping sites of left female breast: Secondary | ICD-10-CM | POA: Insufficient documentation

## 2023-01-14 DIAGNOSIS — Z1721 Progesterone receptor positive status: Secondary | ICD-10-CM | POA: Insufficient documentation

## 2023-01-14 DIAGNOSIS — Z9012 Acquired absence of left breast and nipple: Secondary | ICD-10-CM | POA: Insufficient documentation

## 2023-01-14 DIAGNOSIS — Z1732 Human epidermal growth factor receptor 2 negative status: Secondary | ICD-10-CM | POA: Insufficient documentation

## 2023-01-14 DIAGNOSIS — R911 Solitary pulmonary nodule: Secondary | ICD-10-CM | POA: Diagnosis not present

## 2023-01-14 DIAGNOSIS — Z170 Estrogen receptor positive status [ER+]: Secondary | ICD-10-CM | POA: Insufficient documentation

## 2023-01-14 DIAGNOSIS — Z79811 Long term (current) use of aromatase inhibitors: Secondary | ICD-10-CM | POA: Insufficient documentation

## 2023-01-14 NOTE — Progress Notes (Signed)
CLINIC:  Survivorship   Patient Care Team: Zola Button, Grayling Congress, DO as PCP - General Ranee Gosselin, MD as Consulting Physician (Orthopedic Surgery) Donnelly Angelica, RN as Oncology Nurse Navigator Pershing Proud, RN as Oncology Nurse Navigator Malachy Mood, MD as Consulting Physician (Hematology) Lonie Peak, MD as Attending Physician (Radiation Oncology) Griselda Miner, MD as Consulting Physician (General Surgery) Pollyann Samples, NP as Nurse Practitioner (Nurse Practitioner)   REASON FOR VISIT:  Routine follow-up post-treatment for a recent history of breast cancer.  BRIEF ONCOLOGIC HISTORY:  Oncology History Overview Note   Cancer Staging  Malignant neoplasm of overlapping sites of left breast in female, estrogen receptor positive (HCC) Staging form: Breast, AJCC 8th Edition - Clinical stage from 04/08/2022: Stage Unknown (cTX, cN0, cM0, G1, ER+, PR+, HER2-) - Signed by Malachy Mood, MD on 04/16/2022 Stage prefix: Initial diagnosis Histologic grading system: 3 grade system     Malignant neoplasm of overlapping sites of left breast in female, estrogen receptor positive (HCC)  03/20/2022 Imaging    IMPRESSION: 1. Suspicious architectural distortion within the central/retroareolar LEFT breast, best seen on spot compression CC slice 29, without sonographic correlate. Stereotactic biopsy is recommended. 2. Additional possible subtle architectural distortion within the outer LEFT breast, best seen on spot compression CC slice 46, without sonographic correlate. Stereotactic biopsy is recommended.     04/08/2022 Procedure    FINDINGS: 3D Mammographic images were obtained following stereotactic core biopsies of left breast distortion. There is a ribbon and coil shaped clip in the upper aspect of the breast in appropriate position.     04/16/2022 Initial Diagnosis   Malignant neoplasm of overlapping sites of left breast in female, estrogen receptor positive (HCC)    06/24/2022 Cancer Staging   Staging form: Breast, AJCC 8th Edition - Pathologic stage from 06/24/2022: Stage IB (pT3, pN1a, cM0, G2, ER+, PR+, HER2-) - Signed by Malachy Mood, MD on 10/17/2022 Histologic grading system: 3 grade system Residual tumor (R): R0 - None     INTERVAL HISTORY:  Diane Hodges presents to the Survivorship Clinic today for our initial meeting to review her survivorship care plan detailing her treatment course for breast cancer, as well as monitoring long-term side effects of that treatment, education regarding health maintenance, screening, and overall wellness and health promotion.     Overall, Diane Hodges reports feeling well since completing her radiation therapy.  Skin healed with with topicals.  She started anastrozole, tolerating well.  She has noticed mild and quick hot flashes, no joint pain thus far.  Denies other side effects or concerns.    REVIEW OF SYSTEMS:  Review of Systems - Oncology Breast: Denies any new nodularity, masses, tenderness, nipple changes, or nipple discharge.      ONCOLOGY TREATMENT TEAM:  1. Surgeon:  Dr. [facility]ynne [facility]ouard at [facility] 2. Medical Oncologist: Dr. Mosetta Putt 3. Radiation Oncologist: Dr. Basilio Cairo    PAST MEDICAL/SURGICAL HISTORY:  Past Medical History:  Diagnosis Date   Atypical nevus 04/12/2014   mid back -mild   DM (diabetes mellitus), type 2 (HCC)    Hyperlipemia    Hypertension    PONV (postoperative nausea and vomiting)    with one surgery   Thyroid disease    Hypothyridism   Past Surgical History:  Procedure Laterality Date   BREAST BIOPSY Left 04/08/2022   MM LT BREAST BX W LOC DEV EA AD LESION IMG BX SPEC STEREO GUIDE 04/08/2022 [facility]   BREAST BIOPSY Left 04/08/2022  MM LT BREAST BX W LOC DEV 1ST LESION IMAGE BX SPEC STEREO GUIDE 04/08/2022 [facility]   BREAST BIOPSY Left 06/21/2022   [place] LT RADIOACTIVE SEED LOC 06/21/2022 [facility]   BREAST RECONSTRUCTION WITH PLACEMENT OF TISSUE  EXPANDER AND FLEX HD (ACELLULAR HYDRATED DERMIS) Left 06/24/2022   Procedure: IMMEDIATE LEFT BREAST RECONSTRUCTION WITH PLACEMENT OF TISSUE EXPANDER AND FLEX HD (ACELLULAR HYDRATED DERMIS);  Surgeon: Peggye Form, DO;  Location: [facility];  Service: Plastics;  Laterality: Left;   CATARACT EXTRACTION Right 2015   MASTECTOMY W/ SENTINEL NODE BIOPSY Left 06/24/2022   Procedure: LEFT MASTECTOMY WITH SENTINEL LYMPH NODE BIOPSY;  Surgeon: Griselda Miner, MD;  Location: [facility];  Service: General;  Laterality: Left;   OOPHORECTOMY  1980   RADIOACTIVE SEED GUIDED AXILLARY SENTINEL LYMPH NODE Left 06/24/2022   Procedure: RADIOACTIVE SEED GUIDED LEFT AXILLARY SENTINEL LYMPH NODE DISSECTION;  Surgeon: Griselda Miner, MD;  Location: [facility];  Service: General;  Laterality: Left;   REMOVAL OF TISSUE EXPANDER AND PLACEMENT OF IMPLANT Left 08/12/2022   Procedure: REMOVAL OF TISSUE EXPANDER AND PLACEMENT OF IMPLANT;  Surgeon: Peggye Form, DO;  Location: [facility];  Service: Plastics;  Laterality: Left;     ALLERGIES:  Allergies  Allergen Reactions   Other Other (See Comments)    Adhesive Strips causes blisters   Azithromycin Rash   Latex Rash     CURRENT MEDICATIONS:  Outpatient Encounter Medications as of 01/14/2023  Medication Sig   anastrozole (ARIMIDEX) 1 MG tablet TAKE 1 TABLET BY MOUTH EVERY DAY   ascorbic acid (VITAMIN C) 500 MG tablet Take 500 mg by mouth daily.   blood glucose meter kit and supplies KIT Dispense based on patient and insurance preference. Use up to four times daily as directed. (FOR ICD-9 250.00, 250.01).   carvedilol (COREG) 6.25 MG tablet Take 1 tablet (6.25 mg total) by mouth 2 (two) times daily with a meal.   Cholecalciferol (VITAMIN D) 50 MCG (2000 UT) tablet Take 2,000 Units by mouth daily.   fenofibrate 54 MG tablet Take 1 tablet (54 mg total) by mouth daily.   glucose blood test strip Onetouch verio test  strips. Check blood sugar once a day   levothyroxine (SYNTHROID) 112 MCG tablet TAKE 1 TABLET BY MOUTH DAILY BEFORE BREAKFAST.   metFORMIN (GLUCOPHAGE) 500 MG tablet TAKE 1 TABLET BY MOUTH TWICE A DAY WITH FOOD   Multiple Vitamin (MULTIVITAMIN ADULT) TABS Take 1 tablet by mouth daily.   ONETOUCH DELICA LANCETS FINE MISC 1 Device by Does not apply route daily.   rosuvastatin (CRESTOR) 10 MG tablet TAKE 1 TABLET BY MOUTH EVERY DAY   Semaglutide,0.25 or 0.5MG /DOS, 2 MG/3ML SOPN Inject 0.25 mg into the skin once a week. Increase to 0.5mg  in one month.   triamcinolone cream (KENALOG) 0.1 % Apply 1 Application topically daily as needed.   No facility-administered encounter medications on file as of 01/14/2023.     ONCOLOGIC FAMILY HISTORY:  Family History  Problem Relation Age of Onset   Cancer Mother        breast cancer or precancer   Hypertension Mother    Hypertension Father    Melanoma Father 39   Thyroid disease Other      GENETIC COUNSELING/TESTING: None  SOCIAL HISTORY:  Diane Hodges is married, [place]   Diane Hodges is not working.  Quit smoking in the 80s.  She denies any current or history of tobacco, alcohol, or illicit drug use.     PHYSICAL EXAMINATION:  Vital Signs:   Vitals:   01/14/23 1103 01/14/23 1108  BP: (!) 142/73 120/64  Pulse: 86   Resp: 18   Temp: 97.6 F (36.4 C)   SpO2: 98%    Filed Weights   01/14/23 1103  Weight: 192 lb 8 oz (87.3 kg)   General: Well-nourished, well-appearing female in no acute distress.   HEENT:  Sclerae anicteric.  Lymph: No cervical, supraclavicular, or infraclavicular lymphadenopathy noted on palpation.  Respiratory: breathing non-labored.  GI: Abdomen soft and round; non-tender, non-distended. Bowel sounds normoactive.  Neuro: No focal deficits. Steady gait.  Psych: Mood and affect normal and appropriate for situation.  Extremities: No edema. MSK: No focal spinal  tenderness to palpation.  Full range of motion in bilateral upper extremities Skin: Warm and dry. Breast exam: S/p left mastectomy and reconstruction, incisions completely healed with focal thickness/mild edema in the central breast from radiation.  No palpable mass or nodularity in either breast or axilla that I could appreciate  LABORATORY DATA:  None for this visit.  DIAGNOSTIC IMAGING:  None for this visit.      ASSESSMENT AND PLAN:  Ms.. Hodges is a pleasant 67 y.o. female with Stage IIA left breast invasive lobular carcinoma, ER+/PR+/HER2-, diagnosed in 04/2022, treated with left mastectomy, adjuvant radiation therapy, and anti-estrogen therapy with anastrozole beginning in 10/2022.  She presents to the Survivorship Clinic for our initial meeting and routine follow-up post-completion of treatment for breast cancer.    1. Stage IIA left breast cancer:  Diane Hodges has recovered well from definitive treatment for breast cancer. She will follow-up with her medical oncologist, Dr. Mosetta Putt in 04/2023 with history and physical exam per surveillance protocol.  She will continue her anti-estrogen therapy with anastrozole. Thus far, she is tolerating well, with minimal side effects. She was instructed to make Dr. Mosetta Putt or myself aware if she begins to experience any worsening side effects of the medication and I could see her back in clinic to help manage those side effects, as needed. Today, a comprehensive survivorship care plan and treatment summary was reviewed with the patient today detailing her breast cancer diagnosis, treatment course, potential late/long-term effects of treatment, appropriate follow-up care with recommendations for the future, and patient education resources.  A copy of this summary, along with a letter will be sent to the patient's primary care provider via mail/fax/In Basket message after today's visit.    2.  Left lower lobe pulmonary nodule: Found on breast cancer staging workup.   No prior CT chest for comparison.  Former smoker in the 76s.  No respiratory symptoms.  Will order follow-up CT chest 05/2023 to establish stability  3. Bone health:  Given Diane Hodges's age/history of breast cancer and her current treatment regimen including anti-estrogen therapy with anastrozole, she is at risk for bone demineralization.  Her last DEXA scan was normal on 03/2022.  Will repeat every 2 years.  I encouraged her to take calcium, vitamin D, and engage in weightbearing activities.  She was given education on specific activities to promote bone health.  4. Cancer screening:  Due to Diane Hodges's history and her age, she should receive screening for skin cancers, colon cancer, and [facility] gynecologic cancers.  She has Cologuard and will complete the test in the near future.  The information and recommendations are listed on the patient's comprehensive care plan/treatment summary and  were reviewed in detail with the patient.    5. Health maintenance and wellness promotion: Diane Hodges was encouraged to consume 5-7 servings of fruits and vegetables per day.  She was also encouraged to engage in moderate to vigorous exercise for 30 minutes per day most days of the week. She was instructed to limit her alcohol consumption and continue to abstain from tobacco use.     6. Support services/counseling: It is not uncommon for this period of the patient's cancer care trajectory to be one of many emotions and stressors.  We discussed an opportunity for her to participate in the next session of [facility] ("Finding Your New Normal") support group series designed for patients after they have completed treatment.   Diane Hodges was encouraged to take advantage of our many other support services programs, support groups, and/or counseling in coping with her new life as a cancer survivor after completing anti-cancer treatment.  She was offered support today through active listening and expressive supportive counseling.  She  was given information regarding our available services and encouraged to contact me with any questions or for help enrolling in any of our support group/programs.    Dispo:   -Return to cancer center 04/08/2023 as scheduled -Continue Anastrozole  -Right mammogram due in 03/2023, ordered (no role for left mammo s/p mastectomy) -Follow up with surgery as indicated -She is welcome to return back to the Survivorship Clinic at any time; no additional follow-up needed at this time.  -Consider referral back to survivorship as a long-term survivor for continued surveillance  Orders Placed This Encounter  Procedures   CT Chest Wo Contrast    Standing Status:   Future    Standing Expiration Date:   01/14/2024    Order Specific Question:   Preferred imaging location?    Answer:   [facility]   MM 3D SCREENING MAMMOGRAM UNILATERAL RIGHT BREAST    Standing Status:   Future    Standing Expiration Date:   01/14/2024    Order Specific Question:   Reason for Exam (SYMPTOM  OR DIAGNOSIS REQUIRED)    Answer:   L breast cancer 04/2022 s/p mastectomy    Order Specific Question:   Preferred imaging location?    Answer:   [facility]    A total of (30) minutes of face-to-face time was spent with this patient with greater than 50% of that time in counseling and care-coordination.   Santiago Glad, NP Survivorship Program [facility] [phone]   Note: PRIMARY CARE PROVIDER Donato Schultz, [place] [phone]

## 2023-01-23 ENCOUNTER — Other Ambulatory Visit: Payer: Self-pay | Admitting: Family Medicine

## 2023-01-23 DIAGNOSIS — E785 Hyperlipidemia, unspecified: Secondary | ICD-10-CM

## 2023-02-06 ENCOUNTER — Telehealth: Payer: Self-pay | Admitting: Family Medicine

## 2023-02-06 NOTE — Telephone Encounter (Signed)
Copied from CRM [id number]. Topic: Medicare AWV >> Feb 06, 2023  3:27 PM Payton Doughty wrote: Reason for CRM: Called LVM 02/06/2023 to schedule AWV TELEHEALTH ONLY  Verlee Rossetti; Care Guide Ambulatory Clinical Support [place] [facility] Direct Dial: [phone]

## 2023-02-19 ENCOUNTER — Telehealth: Payer: Self-pay

## 2023-02-19 NOTE — Patient Outreach (Signed)
Attempted to contact patient regarding DM eye, [facility]. Left voicemail for patient to return my call at [phone].  Nicholes Rough, CMA Care Guide [employer]

## 2023-02-20 ENCOUNTER — Other Ambulatory Visit: Payer: Self-pay | Admitting: Family Medicine

## 2023-02-20 DIAGNOSIS — I10 Essential (primary) hypertension: Secondary | ICD-10-CM

## 2023-03-20 ENCOUNTER — Other Ambulatory Visit: Payer: Self-pay | Admitting: Family Medicine

## 2023-03-20 DIAGNOSIS — E039 Hypothyroidism, unspecified: Secondary | ICD-10-CM

## 2023-04-07 NOTE — Assessment & Plan Note (Signed)
 invasive lobular carcinoma, cT3N1M0, stage IIA, ER+/PR+/HER2-, G1  -diagnosed in 04/2022 -Given the lobular histology, she underwent breast MRI, which showed 6.1 cm mass in the central of left breast, with 1 abnormal axillary lymph node. -Staging bone scan showed abnormal uptake in L2 and L3, we obtained spinal MRI to evaluate the bone metastasis which was negative. -CT chest abdomen pelvis with contrast was negative for metastatic disease. -She underwent left breast mastectomy and tolerated lymph node biopsy in March 2024.  I reviewed her surgical pathology findings with her in detail -Her Oncotype showed recurrence score 2, predicts her risk of distant recurrence in the next 90 days 9%, no adjuvant chemotherapy is recommended.  -She completed adjuvant radiation -She started adjuvant anastrozole  in August 2024, tolerating well overall

## 2023-04-08 ENCOUNTER — Inpatient Hospital Stay: Payer: Medicare Other | Attending: Radiation Oncology

## 2023-04-08 ENCOUNTER — Inpatient Hospital Stay: Payer: Medicare Other | Admitting: Hematology

## 2023-04-08 ENCOUNTER — Encounter: Payer: Self-pay | Admitting: Hematology

## 2023-04-08 ENCOUNTER — Other Ambulatory Visit: Payer: Self-pay | Admitting: *Deleted

## 2023-04-08 VITALS — BP 155/87 | HR 108 | Temp 97.9°F | Resp 16 | Wt 190.4 lb

## 2023-04-08 DIAGNOSIS — Z7985 Long-term (current) use of injectable non-insulin antidiabetic drugs: Secondary | ICD-10-CM | POA: Diagnosis not present

## 2023-04-08 DIAGNOSIS — Z170 Estrogen receptor positive status [ER+]: Secondary | ICD-10-CM | POA: Diagnosis not present

## 2023-04-08 DIAGNOSIS — Z9012 Acquired absence of left breast and nipple: Secondary | ICD-10-CM | POA: Diagnosis not present

## 2023-04-08 DIAGNOSIS — I10 Essential (primary) hypertension: Secondary | ICD-10-CM | POA: Insufficient documentation

## 2023-04-08 DIAGNOSIS — Z79899 Other long term (current) drug therapy: Secondary | ICD-10-CM | POA: Diagnosis not present

## 2023-04-08 DIAGNOSIS — Z923 Personal history of irradiation: Secondary | ICD-10-CM | POA: Diagnosis not present

## 2023-04-08 DIAGNOSIS — C50812 Malignant neoplasm of overlapping sites of left female breast: Secondary | ICD-10-CM

## 2023-04-08 DIAGNOSIS — Z79811 Long term (current) use of aromatase inhibitors: Secondary | ICD-10-CM | POA: Diagnosis not present

## 2023-04-08 DIAGNOSIS — E039 Hypothyroidism, unspecified: Secondary | ICD-10-CM | POA: Insufficient documentation

## 2023-04-08 DIAGNOSIS — E785 Hyperlipidemia, unspecified: Secondary | ICD-10-CM | POA: Diagnosis not present

## 2023-04-08 DIAGNOSIS — E119 Type 2 diabetes mellitus without complications: Secondary | ICD-10-CM | POA: Insufficient documentation

## 2023-04-08 DIAGNOSIS — Z7984 Long term (current) use of oral hypoglycemic drugs: Secondary | ICD-10-CM | POA: Insufficient documentation

## 2023-04-08 LAB — CMP (CANCER CENTER ONLY)
ALT: 14 U/L (ref 0–44)
AST: 16 U/L (ref 15–41)
Albumin: 4.6 g/dL (ref 3.5–5.0)
Alkaline Phosphatase: 61 U/L (ref 38–126)
Anion gap: 7 (ref 5–15)
BUN: 9 mg/dL (ref 8–23)
CO2: 29 mmol/L (ref 22–32)
Calcium: 10.3 mg/dL (ref 8.9–10.3)
Chloride: 104 mmol/L (ref 98–111)
Creatinine: 0.66 mg/dL (ref 0.44–1.00)
GFR, Estimated: >60 mL/min (ref >60)
Glucose, Bld: 104 mg/dL — ABNORMAL HIGH (ref 70–99)
Potassium: 3.9 mmol/L (ref 3.5–5.1)
Sodium: 140 mmol/L (ref 135–145)
Total Bilirubin: 0.5 mg/dL (ref 0.0–1.2)
Total Protein: 7.6 g/dL (ref 6.5–8.1)

## 2023-04-08 LAB — CBC WITH DIFFERENTIAL (CANCER CENTER ONLY)
Abs Immature Granulocytes: 0.03 10*3/uL (ref 0.00–0.07)
Basophils Absolute: 0 10*3/uL (ref 0.0–0.1)
Basophils Relative: 1 %
Eosinophils Absolute: 0.1 10*3/uL (ref 0.0–0.5)
Eosinophils Relative: 2 %
HCT: 39.1 % (ref 36.0–46.0)
Hemoglobin: 13.1 g/dL (ref 12.0–15.0)
Immature Granulocytes: 0 %
Lymphocytes Relative: 22 %
Lymphs Abs: 1.7 10*3/uL (ref 0.7–4.0)
MCH: 30.3 pg (ref 26.0–34.0)
MCHC: 33.5 g/dL (ref 30.0–36.0)
MCV: 90.5 fL (ref 80.0–100.0)
Monocytes Absolute: 0.4 10*3/uL (ref 0.1–1.0)
Monocytes Relative: 5 %
Neutro Abs: 5.6 10*3/uL (ref 1.7–7.7)
Neutrophils Relative %: 70 %
Platelet Count: 209 10*3/uL (ref 150–400)
RBC: 4.32 MIL/uL (ref 3.87–5.11)
RDW: 12.5 % (ref 11.5–15.5)
WBC Count: 7.9 10*3/uL (ref 4.0–10.5)
nRBC: 0 % (ref 0.0–0.2)

## 2023-04-08 NOTE — Progress Notes (Signed)
 [facility]   Telephone:([phone]) [phone] Fax:([phone]) [phone]   Clinic Follow up Note   Patient Care Team: Antonio Meth, Jamee SAUNDERS, DO as PCP - General Heide Ingle, MD as Consulting Physician (Orthopedic Surgery) Tyree Nanetta SAILOR, RN as Oncology Nurse Navigator Glean Stephane BROCKS, RN as Oncology Nurse Navigator Lanny Callander, MD as Consulting Physician (Hematology) Izell Domino, MD as Attending Physician (Radiation Oncology) Curvin Deward MOULD, MD as Consulting Physician (General Surgery) Burton, Lacie K, NP as Nurse Practitioner (Nurse Practitioner) 04/08/2023  I connected with Diane Hodges on 04/08/23 at 12:00 PM EST by telephone and verified that I am speaking with the correct person using two identifiers.   I discussed the limitations, risks, security and privacy concerns of performing an evaluation and management service by telephone and the availability of in person appointments. I also discussed with the patient that there may be a patient responsible charge related to this service. The patient expressed understanding and agreed to proceed.   Patient's location:  Office  Provider's location: Home   CHIEF COMPLAINT: Follow-up left breast cancer   CURRENT THERAPY: Adjuvant anastrozole   Oncology history Malignant neoplasm of overlapping sites of left breast in female, estrogen receptor positive (HCC) invasive lobular carcinoma, cT3N1M0, stage IIA, ER+/PR+/HER2-, G1  -diagnosed in 04/2022 -Given the lobular histology, she underwent breast MRI, which showed 6.1 cm mass in the central of left breast, with 1 abnormal axillary lymph node. -Staging bone scan showed abnormal uptake in L2 and L3, we obtained spinal MRI to evaluate the bone metastasis which was negative. -CT chest abdomen pelvis with contrast was negative for metastatic disease. -She underwent left breast mastectomy and tolerated lymph node biopsy in March 2024.  I reviewed her surgical pathology findings with  her in detail -Her Oncotype showed recurrence score 2, predicts her risk of distant recurrence in the next 90 days 9%, no adjuvant chemotherapy is recommended.  -She completed adjuvant radiation -She started adjuvant anastrozole  in August 2024, tolerating well overall   Assessment and Plan    Breast Cancer Currently on anastrozole  with manageable loose bowel movements. No new symptoms. Prefers to continue anastrozole  for two more months and will consider switching to letrozole if symptoms persist. Discussed letrozole and azelastine as alternatives.  -Due for a diagnostic mammogram. - Continue anastrozole  for two more months - Switch to letrozole if loose bowel movements persist - Order diagnostic mammogram - Schedule follow-up in six months with Armida - Send MyChart message or call for anastrozole  refill in two months  Post-Surgical Discomfort Mild discomfort and soreness under the breast incision site when lying on that side, likely due to nerve damage from surgery. No signs of infection or complications. - Monitor for changes or worsening of symptoms - Perform breast exam at next follow-up visit in six months  General Health Maintenance Due for a diagnostic mammogram. Discussed importance despite personal stressors. Insurance will cover diagnostic mammogram for a couple of years. - Order diagnostic mammogram - Provide breast center contact information  Plan -She will continue anastrozole  for now.  If her diarrhea gets worse, she will call us  and we will switch to letrozole. - Schedule follow-up appointment in six months with Lacie        SUMMARY OF ONCOLOGIC HISTORY: Oncology History Overview Note   Cancer Staging  Malignant neoplasm of overlapping sites of left breast in female, estrogen receptor positive (HCC) Staging form: Breast, AJCC 8th Edition - Clinical stage from 04/08/2022: Stage Unknown (cTX, cN0, cM0, G1,  ER+, PR+, HER2-) - Signed by Lanny Callander, MD on  04/16/2022 Stage prefix: Initial diagnosis Histologic grading system: 3 grade system     Malignant neoplasm of overlapping sites of left breast in female, estrogen receptor positive (HCC)  03/20/2022 Imaging    IMPRESSION: 1. Suspicious architectural distortion within the central/retroareolar LEFT breast, best seen on spot compression CC slice 29, without sonographic correlate. Stereotactic biopsy is recommended. 2. Additional possible subtle architectural distortion within the outer LEFT breast, best seen on spot compression CC slice 46, without sonographic correlate. Stereotactic biopsy is recommended.     04/08/2022 Procedure    FINDINGS: 3D Mammographic images were obtained following stereotactic core biopsies of left breast distortion. There is a ribbon and coil shaped clip in the upper aspect of the breast in appropriate position.     04/16/2022 Initial Diagnosis   Malignant neoplasm of overlapping sites of left breast in female, estrogen receptor positive (HCC)   06/24/2022 Cancer Staging   Staging form: Breast, AJCC 8th Edition - Pathologic stage from 06/24/2022: Stage IB (pT3, pN1a, cM0, G2, ER+, PR+, HER2-) - Signed by Lanny Callander, MD on 10/17/2022 Histologic grading system: 3 grade system Residual tumor (R): R0 - None     Discussed the use of AI scribe software for clinical note transcription with the patient, who gave verbal consent to proceed.  History of Present Illness   Diane Hodges, a patient with a history of breast cancer, presents for a follow-up visit. She reports no new issues in the past three to six months. She is currently on anastrozole  for her breast cancer and has been experiencing loose bowel movements, which she believes are related to the medication. She has tried changing the time she takes the medication, which has helped somewhat. She typically has bowel movements once or twice a day. She is considering trying a different medication if the loose bowel  movements continue, but wants to continue with the anastrozole  for now. She also reports some discomfort in the area of her surgical incision when she lies on that side, but otherwise has no concerns about the incision or any discomfort in the breast.         REVIEW OF SYSTEMS:   Constitutional: Denies fevers, chills or abnormal weight loss Eyes: Denies blurriness of vision Ears, nose, mouth, throat, and face: Denies mucositis or sore throat Respiratory: Denies cough, dyspnea or wheezes Cardiovascular: Denies palpitation, chest discomfort or lower extremity swelling Gastrointestinal:  Denies nausea, heartburn or change in bowel habits Skin: Denies abnormal skin rashes Lymphatics: Denies new lymphadenopathy or easy bruising Neurological:Denies numbness, tingling or new weaknesses Behavioral/Psych: Mood is stable, no new changes  All other systems were reviewed with the patient and are negative.  MEDICAL HISTORY:  Past Medical History:  Diagnosis Date   Atypical nevus 04/12/2014   mid back -mild   DM (diabetes mellitus), type 2 (HCC)    Hyperlipemia    Hypertension    PONV (postoperative nausea and vomiting)    with one surgery   Thyroid  disease    Hypothyridism    SURGICAL HISTORY: Past Surgical History:  Procedure Laterality Date   BREAST BIOPSY Left 04/08/2022   MM LT BREAST BX W LOC DEV EA AD LESION IMG BX SPEC STEREO GUIDE 04/08/2022 [facility]   BREAST BIOPSY Left 04/08/2022   MM LT BREAST BX W LOC DEV 1ST LESION IMAGE BX SPEC STEREO GUIDE 04/08/2022 [facility]   BREAST BIOPSY Left 06/21/2022   US  LT RADIOACTIVE  SEED LOC 06/21/2022 [facility]   BREAST RECONSTRUCTION WITH PLACEMENT OF TISSUE EXPANDER AND FLEX HD (ACELLULAR HYDRATED DERMIS) Left 06/24/2022   Procedure: IMMEDIATE LEFT BREAST RECONSTRUCTION WITH PLACEMENT OF TISSUE EXPANDER AND FLEX HD (ACELLULAR HYDRATED DERMIS);  Surgeon: Lowery Estefana RAMAN, DO;  Location: [facility];   Service: Plastics;  Laterality: Left;   CATARACT EXTRACTION Right 2015   MASTECTOMY W/ SENTINEL NODE BIOPSY Left 06/24/2022   Procedure: LEFT MASTECTOMY WITH SENTINEL LYMPH NODE BIOPSY;  Surgeon: Curvin Deward MOULD, MD;  Location: [facility];  Service: General;  Laterality: Left;   OOPHORECTOMY  1980   RADIOACTIVE SEED GUIDED AXILLARY SENTINEL LYMPH NODE Left 06/24/2022   Procedure: RADIOACTIVE SEED GUIDED LEFT AXILLARY SENTINEL LYMPH NODE DISSECTION;  Surgeon: Curvin Deward MOULD, MD;  Location: [facility];  Service: General;  Laterality: Left;   REMOVAL OF TISSUE EXPANDER AND PLACEMENT OF IMPLANT Left 08/12/2022   Procedure: REMOVAL OF TISSUE EXPANDER AND PLACEMENT OF IMPLANT;  Surgeon: Lowery Estefana RAMAN, DO;  Location: [facility];  Service: Plastics;  Laterality: Left;    I have reviewed the social history and family history with the patient and they are unchanged from previous note.  ALLERGIES:  is allergic to other, azithromycin, and latex.  MEDICATIONS:  Current Outpatient Medications  Medication Sig Dispense Refill   anastrozole  (ARIMIDEX ) 1 MG tablet TAKE 1 TABLET BY MOUTH EVERY DAY 90 tablet 1   ascorbic acid (VITAMIN C) 500 MG tablet Take 500 mg by mouth daily.     blood glucose meter kit and supplies KIT Dispense based on patient and insurance preference. Use up to four times daily as directed. (FOR ICD-9 250.00, 250.01). 1 each 0   carvedilol  (COREG ) 6.25 MG tablet Take 1 tablet (6.25 mg total) by mouth 2 (two) times daily with a meal. 180 tablet 0   Cholecalciferol (VITAMIN D ) 50 MCG (2000 UT) tablet Take 2,000 Units by mouth daily.     fenofibrate  54 MG tablet Take 1 tablet (54 mg total) by mouth daily. 90 tablet 1   glucose blood test strip Onetouch verio test strips. Check blood sugar once a day 100 each 0   levothyroxine  (SYNTHROID ) 112 MCG tablet TAKE 1 TABLET BY MOUTH DAILY BEFORE BREAKFAST. 90 tablet 0   metFORMIN  (GLUCOPHAGE ) 500 MG tablet TAKE 1 TABLET  BY MOUTH TWICE A DAY WITH FOOD 180 tablet 1   Multiple Vitamin (MULTIVITAMIN ADULT) TABS Take 1 tablet by mouth daily.     ONETOUCH DELICA LANCETS FINE MISC 1 Device by Does not apply route daily. 100 each 0   rosuvastatin  (CRESTOR ) 10 MG tablet TAKE 1 TABLET BY MOUTH EVERY DAY 90 tablet 1   Semaglutide ,0.25 or 0.5MG /DOS, 2 MG/3ML SOPN Inject 0.25 mg into the skin once a week. Increase to 0.5mg  in one month. 3 mL 1   triamcinolone  cream (KENALOG ) 0.1 % Apply 1 Application topically daily as needed. 30 g 4   No current facility-administered medications for this visit.    PHYSICAL EXAMINATION: Not performed   LABORATORY DATA:  I have reviewed the data as listed    Latest Ref Rng & Units 04/08/2023   12:01 PM 10/21/2022    9:26 AM 08/12/2022    9:18 AM  CBC  WBC 4.0 - 10.5 K/uL 7.9  3.2  9.9   Hemoglobin 12.0 - 15.0 g/dL 86.8  86.4  88.3   Hematocrit 36.0 - 46.0 % 39.1  42.2  36.1   Platelets  150 - 400 K/uL 209  184.0  205         Latest Ref Rng & Units 04/08/2023   12:01 PM 10/21/2022    9:26 AM 08/12/2022    9:18 AM  CMP  Glucose 70 - 99 mg/dL 895  875  876   BUN 8 - 23 mg/dL 9  8  5    Creatinine 0.44 - 1.00 mg/dL 9.33  9.23  9.34   Sodium 135 - 145 mmol/L 140  142  138   Potassium 3.5 - 5.1 mmol/L 3.9  4.0  3.6   Chloride 98 - 111 mmol/L 104  104  104   CO2 22 - 32 mmol/L 29  25  22    Calcium  8.9 - 10.3 mg/dL 89.6  89.9  8.8   Total Protein 6.5 - 8.1 g/dL 7.6  7.2    Total Bilirubin 0.0 - 1.2 mg/dL 0.5  0.4    Alkaline Phos 38 - 126 U/L 61  56    AST 15 - 41 U/L 16  20    ALT 0 - 44 U/L 14  18        RADIOGRAPHIC STUDIES: I have personally reviewed the radiological images as listed and agreed with the findings in the report. No results found.     I discussed the assessment and treatment plan with the patient. The patient was provided an opportunity to ask questions and all were answered. The patient agreed with the plan and demonstrated an understanding of the  instructions.   The patient was advised to call back or seek an in-person evaluation if the symptoms worsen or if the condition fails to improve as anticipated.  I provided 18 minutes of face-to-face video visit time during this encounter, and > 50% was spent counseling as documented under my assessment & plan.     Onita Mattock, MD 04/08/23

## 2023-04-09 ENCOUNTER — Telehealth: Payer: Self-pay | Admitting: Hematology

## 2023-04-09 NOTE — Telephone Encounter (Signed)
 Patient is aware of scheduled appointment times/dates for follow up appointment

## 2023-04-14 ENCOUNTER — Ambulatory Visit: Payer: Medicare Other | Attending: General Surgery

## 2023-04-14 VITALS — Wt 191.1 lb

## 2023-04-14 DIAGNOSIS — Z483 Aftercare following surgery for neoplasm: Secondary | ICD-10-CM | POA: Insufficient documentation

## 2023-04-14 NOTE — Therapy (Signed)
 OUTPATIENT PHYSICAL THERAPY SOZO SCREENING NOTE   Patient Name: Diane Hodges MRN: [id number] DOB:Feb 07, 1956, 68 y.o., female Today's Date: 04/14/2023  PCP: Antonio Cyndee Jamee JONELLE, DO REFERRING PROVIDER: Curvin Deward MOULD, MD   PT End of Session - 04/14/23 1521     Visit Number 4   # unchanged due to screen only   PT Start Time 1518    PT Stop Time 1523    PT Time Calculation (min) 5 min    Activity Tolerance Patient tolerated treatment well    Behavior During Therapy [facility] for tasks assessed/performed             Past Medical History:  Diagnosis Date   Atypical nevus 04/12/2014   mid back -mild   DM (diabetes mellitus), type 2 (HCC)    Hyperlipemia    Hypertension    PONV (postoperative nausea and vomiting)    with one surgery   Thyroid  disease    Hypothyridism   Past Surgical History:  Procedure Laterality Date   BREAST BIOPSY Left 04/08/2022   MM LT BREAST BX W LOC DEV EA AD LESION IMG BX SPEC STEREO GUIDE 04/08/2022 [facility]   BREAST BIOPSY Left 04/08/2022   MM LT BREAST BX W LOC DEV 1ST LESION IMAGE BX SPEC STEREO GUIDE 04/08/2022 [facility]   BREAST BIOPSY Left 06/21/2022   US  LT RADIOACTIVE SEED LOC 06/21/2022 [facility]   BREAST RECONSTRUCTION WITH PLACEMENT OF TISSUE EXPANDER AND FLEX HD (ACELLULAR HYDRATED DERMIS) Left 06/24/2022   Procedure: IMMEDIATE LEFT BREAST RECONSTRUCTION WITH PLACEMENT OF TISSUE EXPANDER AND FLEX HD (ACELLULAR HYDRATED DERMIS);  Surgeon: Lowery Estefana RAMAN, DO;  Location: [facility];  Service: Plastics;  Laterality: Left;   CATARACT EXTRACTION Right 2015   MASTECTOMY W/ SENTINEL NODE BIOPSY Left 06/24/2022   Procedure: LEFT MASTECTOMY WITH SENTINEL LYMPH NODE BIOPSY;  Surgeon: Curvin Deward MOULD, MD;  Location: [facility];  Service: General;  Laterality: Left;   OOPHORECTOMY  1980   RADIOACTIVE SEED GUIDED AXILLARY SENTINEL LYMPH NODE Left 06/24/2022   Procedure: RADIOACTIVE SEED GUIDED LEFT  AXILLARY SENTINEL LYMPH NODE DISSECTION;  Surgeon: Curvin Deward MOULD, MD;  Location: [facility];  Service: General;  Laterality: Left;   REMOVAL OF TISSUE EXPANDER AND PLACEMENT OF IMPLANT Left 08/12/2022   Procedure: REMOVAL OF TISSUE EXPANDER AND PLACEMENT OF IMPLANT;  Surgeon: Lowery Estefana RAMAN, DO;  Location: [facility];  Service: Plastics;  Laterality: Left;   Patient Active Problem List   Diagnosis Date Noted   Breast cancer (HCC) 06/24/2022   Carcinoma of central portion of left breast in female, estrogen receptor positive (HCC) 04/17/2022   Malignant neoplasm of overlapping sites of left breast in female, estrogen receptor positive (HCC) 04/16/2022   Preventative health care 07/30/2021   Uncontrolled type 2 diabetes mellitus with hyperglycemia (HCC) 06/26/2020   Anxiety 02/20/2013   Obesity (BMI 30-39.9) 02/19/2013   Hyperlipidemia 10/16/2009   Essential hypertension 12/26/2008   ALLERGIC RHINITIS 11/12/2007   COMMON MIGRAINE 12/08/2006   SKIN RASH 12/08/2006   Hypothyroidism 10/20/2006   BRONCHITIS, ACUTE 10/20/2006    REFERRING DIAG: left breast cancer at risk for lymphedema  THERAPY DIAG: Aftercare following surgery for neoplasm  PERTINENT HISTORY: Patient was diagnosed on 03/05/2022 with left grade 1 invasive lobular carcinoma breast cancer. She underwent a left mastectomy and axillary lymph node dissection on 06/24/2022. It is ER/PR positive and HER2 negative with a Ki67 of 2%. 1/4 LN's   PRECAUTIONS:  left UE Lymphedema risk  SUBJECTIVE: Pt returns for her 3 month L-Dex screen. My mom isn't doing well, she's on hospice.  PAIN:  Are you having pain? No  SOZO SCREENING: Patient was assessed today using the SOZO machine to determine the lymphedema index score. This was compared to her baseline score. It was determined that she is within the recommended range when compared to her baseline and no further action is needed at this time. She will continue SOZO  screenings. These are done every 3 months for 2 years post operatively followed by every 6 months for 2 years, and then annually.   L-DEX FLOWSHEETS - 04/14/23 1500       L-DEX LYMPHEDEMA SCREENING   Measurement Type Unilateral    L-DEX MEASUREMENT EXTREMITY Upper Extremity    POSITION  Standing    DOMINANT SIDE Right    At Risk Side Left    BASELINE SCORE (UNILATERAL) -0.1    L-DEX SCORE (UNILATERAL) -3    VALUE CHANGE (UNILAT) -2.9            Berwyn Knights, PTA 04/14/23 3:23 PM

## 2023-04-17 ENCOUNTER — Other Ambulatory Visit: Payer: Self-pay | Admitting: Family Medicine

## 2023-05-07 ENCOUNTER — Other Ambulatory Visit: Payer: Self-pay | Admitting: Hematology

## 2023-05-07 ENCOUNTER — Ambulatory Visit
Admission: RE | Admit: 2023-05-07 | Discharge: 2023-05-07 | Disposition: A | Discharge status: Home / Self Care | Payer: Medicare Other | Source: Ambulatory Visit | Attending: Hematology | Admitting: Hematology

## 2023-05-07 DIAGNOSIS — Z170 Estrogen receptor positive status [ER+]: Secondary | ICD-10-CM

## 2023-05-07 DIAGNOSIS — Z853 Personal history of malignant neoplasm of breast: Secondary | ICD-10-CM | POA: Diagnosis not present

## 2023-05-09 ENCOUNTER — Other Ambulatory Visit: Payer: Self-pay | Admitting: Family Medicine

## 2023-05-13 ENCOUNTER — Other Ambulatory Visit: Payer: Self-pay | Admitting: Family Medicine

## 2023-05-20 ENCOUNTER — Other Ambulatory Visit: Payer: Self-pay | Admitting: Family Medicine

## 2023-05-20 DIAGNOSIS — I10 Essential (primary) hypertension: Secondary | ICD-10-CM

## 2023-06-09 ENCOUNTER — Ambulatory Visit (HOSPITAL_COMMUNITY)
Admission: RE | Admit: 2023-06-09 | Discharge: 2023-06-09 | Disposition: A | Discharge status: Home / Self Care | Payer: Medicare Other | Source: Ambulatory Visit | Attending: Nurse Practitioner | Admitting: Nurse Practitioner

## 2023-06-09 DIAGNOSIS — C50812 Malignant neoplasm of overlapping sites of left female breast: Secondary | ICD-10-CM | POA: Diagnosis not present

## 2023-06-09 DIAGNOSIS — Z170 Estrogen receptor positive status [ER+]: Secondary | ICD-10-CM | POA: Insufficient documentation

## 2023-06-09 DIAGNOSIS — R911 Solitary pulmonary nodule: Secondary | ICD-10-CM | POA: Diagnosis not present

## 2023-06-09 DIAGNOSIS — R918 Other nonspecific abnormal finding of lung field: Secondary | ICD-10-CM | POA: Diagnosis not present

## 2023-06-09 DIAGNOSIS — I700 Atherosclerosis of aorta: Secondary | ICD-10-CM | POA: Diagnosis not present

## 2023-06-10 ENCOUNTER — Other Ambulatory Visit: Payer: Self-pay | Admitting: Family Medicine

## 2023-06-15 ENCOUNTER — Other Ambulatory Visit: Payer: Self-pay | Admitting: Family Medicine

## 2023-06-15 DIAGNOSIS — I10 Essential (primary) hypertension: Secondary | ICD-10-CM

## 2023-06-15 DIAGNOSIS — E039 Hypothyroidism, unspecified: Secondary | ICD-10-CM

## 2023-07-01 ENCOUNTER — Other Ambulatory Visit: Payer: Self-pay

## 2023-07-01 ENCOUNTER — Telehealth: Payer: Self-pay

## 2023-07-01 NOTE — Telephone Encounter (Addendum)
 Spoke with patient via returned telephone call. Let patient know provider's comments from below.  Patient verbalized understanding and did not have any further concerns or questions.   ----- Message from Pollyann Samples sent at 06/30/2023  8:46 AM EDT ----- Please let pt know CT chest shows stable very small lung nodules, unchanged from 05/2022, likely benign. No further work up at this time.   Thanks Lacie NP

## 2023-07-01 NOTE — Telephone Encounter (Signed)
-----   Message from Pollyann Samples sent at 06/30/2023  8:46 AM EDT ----- Please let pt know CT chest shows stable very small lung nodules, unchanged from 05/2022, likely benign. No further work up at this time.   Thanks Lacie NP

## 2023-07-03 ENCOUNTER — Encounter: Admitting: Family Medicine

## 2023-07-08 ENCOUNTER — Other Ambulatory Visit: Payer: Self-pay | Admitting: Family Medicine

## 2023-07-11 ENCOUNTER — Ambulatory Visit: Admitting: Family Medicine

## 2023-07-11 ENCOUNTER — Encounter: Payer: Self-pay | Admitting: Family Medicine

## 2023-07-11 VITALS — BP 130/78 | HR 85 | Wt 192.0 lb

## 2023-07-11 DIAGNOSIS — E1169 Type 2 diabetes mellitus with other specified complication: Secondary | ICD-10-CM

## 2023-07-11 DIAGNOSIS — I10 Essential (primary) hypertension: Secondary | ICD-10-CM

## 2023-07-11 DIAGNOSIS — Z0000 Encounter for general adult medical examination without abnormal findings: Secondary | ICD-10-CM | POA: Diagnosis not present

## 2023-07-11 DIAGNOSIS — Z7984 Long term (current) use of oral hypoglycemic drugs: Secondary | ICD-10-CM | POA: Diagnosis not present

## 2023-07-11 DIAGNOSIS — E039 Hypothyroidism, unspecified: Secondary | ICD-10-CM | POA: Diagnosis not present

## 2023-07-11 DIAGNOSIS — E559 Vitamin D deficiency, unspecified: Secondary | ICD-10-CM

## 2023-07-11 DIAGNOSIS — E1165 Type 2 diabetes mellitus with hyperglycemia: Secondary | ICD-10-CM

## 2023-07-11 DIAGNOSIS — E785 Hyperlipidemia, unspecified: Secondary | ICD-10-CM | POA: Diagnosis not present

## 2023-07-11 LAB — VITAMIN D 25 HYDROXY (VIT D DEFICIENCY, FRACTURES): VITD: 20.94 ng/mL — ABNORMAL LOW (ref 30.00–100.00)

## 2023-07-11 LAB — TSH: TSH: 0.43 u[IU]/mL (ref 0.35–5.50)

## 2023-07-11 LAB — LIPID PANEL
Cholesterol: 114 mg/dL (ref 0–200)
HDL: 52.1 mg/dL (ref >39.00)
LDL Cholesterol: 37 mg/dL (ref 0–99)
NonHDL: 61.59
Total CHOL/HDL Ratio: 2
Triglycerides: 125 mg/dL (ref 0.0–149.0)
VLDL: 25 mg/dL (ref 0.0–40.0)

## 2023-07-11 LAB — COMPREHENSIVE METABOLIC PANEL WITH GFR
ALT: 13 U/L (ref 0–35)
AST: 16 U/L (ref 0–37)
Albumin: 4.9 g/dL (ref 3.5–5.2)
Alkaline Phosphatase: 57 U/L (ref 39–117)
BUN: 11 mg/dL (ref 6–23)
CO2: 30 meq/L (ref 19–32)
Calcium: 9.5 mg/dL (ref 8.4–10.5)
Chloride: 106 meq/L (ref 96–112)
Creatinine, Ser: 0.56 mg/dL (ref 0.40–1.20)
GFR: 93.17 mL/min (ref >60.00)
Glucose, Bld: 115 mg/dL — ABNORMAL HIGH (ref 70–99)
Potassium: 4.4 meq/L (ref 3.5–5.1)
Sodium: 144 meq/L (ref 135–145)
Total Bilirubin: 0.5 mg/dL (ref 0.2–1.2)
Total Protein: 6.8 g/dL (ref 6.0–8.3)

## 2023-07-11 LAB — CBC WITH DIFFERENTIAL/PLATELET
Basophils Absolute: 0 10*3/uL (ref 0.0–0.1)
Basophils Relative: 0.6 % (ref 0.0–3.0)
Eosinophils Absolute: 0.2 10*3/uL (ref 0.0–0.7)
Eosinophils Relative: 3.4 % (ref 0.0–5.0)
HCT: 39.9 % (ref 36.0–46.0)
Hemoglobin: 13.4 g/dL (ref 12.0–15.0)
Lymphocytes Relative: 31.8 % (ref 12.0–46.0)
Lymphs Abs: 1.5 10*3/uL (ref 0.7–4.0)
MCHC: 33.6 g/dL (ref 30.0–36.0)
MCV: 93.5 fl (ref 78.0–100.0)
Monocytes Absolute: 0.4 10*3/uL (ref 0.1–1.0)
Monocytes Relative: 7.8 % (ref 3.0–12.0)
Neutro Abs: 2.6 10*3/uL (ref 1.4–7.7)
Neutrophils Relative %: 56.4 % (ref 43.0–77.0)
Platelets: 175 10*3/uL (ref 150.0–400.0)
RBC: 4.27 Mil/uL (ref 3.87–5.11)
RDW: 13.4 % (ref 11.5–15.5)
WBC: 4.6 10*3/uL (ref 4.0–10.5)

## 2023-07-11 LAB — MICROALBUMIN / CREATININE URINE RATIO
Creatinine,U: 71.6 mg/dL
Microalb Creat Ratio: 10.2 mg/g (ref 0.0–30.0)
Microalb, Ur: 0.7 mg/dL (ref 0.0–1.9)

## 2023-07-11 LAB — HEMOGLOBIN A1C: Hgb A1c MFr Bld: 6.2 % (ref 4.6–6.5)

## 2023-07-11 MED ORDER — LEVOTHYROXINE SODIUM 112 MCG PO TABS: 112.0000 ug | ORAL_TABLET | Freq: Every day | ORAL | 3 refills | Status: AC

## 2023-07-11 NOTE — Progress Notes (Signed)
 ..                                                Established Patient Office Visit  Subjective   Patient ID: Diane Hodges, female    DOB: 1955-11-26  Age: 68 y.o. MRN: [id number]  Chief Complaint  Patient presents with   Annual Exam    Pt is fasting.     HPI Discussed the use of AI scribe software for clinical note transcription with the patient, who gave verbal consent to proceed.  History of Present Illness Diane Hodges is a 68 year old female who presents for an annual physical exam.  She is actively managing her diabetes with good blood sugar control. She focuses on carbohydrate intake, keeping it under 75 grams per day. She recently picked up her metformin prescription. She is on a 30-day supply of levothyroxine, which will soon transition back to a 90-day supply. She is no longer taking Ozempic.  She continues to follow up with her oncologist regarding her breast cancer treatment. She is currently on anastrozole, which is causing hot flashes and diarrhea. She has been on anastrozole for less than a year and is concerned about the potential need to continue treatment for ten years.  She reports a recent issue with her thumb, describing a sensation of 'clicking' and pain when she stretches her hand. She has been using topical treatments like blue emu and Hergesic, which have provided some relief. She is considering over-the-counter pain relief options such as ibuprofen or acetaminophen.  Her family history is notable for her mother's recent passing at age 39 due to heart failure, which was diagnosed late in her life. Her mother had a history of declining health and possible dementia, which may have contributed to a lack of communication about her heart condition.  Socially, she is retired from [employer], where she worked for 46 years. She has been retired since October 2020. She engages in walking for exercise and is  considering joining a class. She has not seen an eye doctor recently but has been to the dentist. She is not currently seeing an OB GYN as her previous doctor retired.   Patient Active Problem List   Diagnosis Date Noted   Breast cancer (HCC) 06/24/2022   Carcinoma of central portion of left breast in female, estrogen receptor positive (HCC) 04/17/2022   Malignant neoplasm of overlapping sites of left breast in female, estrogen receptor positive (HCC) 04/16/2022   Preventative health care 07/30/2021   Uncontrolled type 2 diabetes mellitus with hyperglycemia (HCC) 06/26/2020   Anxiety 02/20/2013   Obesity (BMI 30-39.9) 02/19/2013   Hyperlipidemia 10/16/2009   Essential hypertension 12/26/2008   ALLERGIC RHINITIS 11/12/2007   COMMON MIGRAINE 12/08/2006   SKIN RASH 12/08/2006   Hypothyroidism 10/20/2006   BRONCHITIS, ACUTE 10/20/2006   Past Medical History:  Diagnosis Date   Atypical nevus 04/12/2014   mid back -mild   DM (diabetes mellitus), type 2 (HCC)    Hyperlipemia    Hypertension    PONV (postoperative nausea and vomiting)    with one surgery   Thyroid disease    Hypothyridism   Past Surgical History:  Procedure Laterality Date   BREAST BIOPSY Left 04/08/2022   MM LT BREAST BX W LOC DEV EA AD LESION IMG BX SPEC STEREO GUIDE 04/08/2022 [facility]  BREAST BIOPSY Left 04/08/2022   MM LT BREAST BX W LOC DEV 1ST LESION IMAGE BX SPEC STEREO GUIDE 04/08/2022 [facility]   BREAST BIOPSY Left 06/21/2022   [place] LT RADIOACTIVE SEED LOC 06/21/2022 [facility]   BREAST RECONSTRUCTION WITH PLACEMENT OF TISSUE EXPANDER AND FLEX HD (ACELLULAR HYDRATED DERMIS) Left 06/24/2022   Procedure: IMMEDIATE LEFT BREAST RECONSTRUCTION WITH PLACEMENT OF TISSUE EXPANDER AND FLEX HD (ACELLULAR HYDRATED DERMIS);  Surgeon: Peggye Form, DO;  Location: [facility];  Service: Plastics;  Laterality: Left;   CATARACT EXTRACTION Right 04/01/2013   MASTECTOMY Left  06/24/2022   MASTECTOMY W/ SENTINEL NODE BIOPSY Left 06/24/2022   Procedure: LEFT MASTECTOMY WITH SENTINEL LYMPH NODE BIOPSY;  Surgeon: Griselda Miner, MD;  Location: [facility];  Service: General;  Laterality: Left;   OOPHORECTOMY  04/01/1978   RADIOACTIVE SEED GUIDED AXILLARY SENTINEL LYMPH NODE Left 06/24/2022   Procedure: RADIOACTIVE SEED GUIDED LEFT AXILLARY SENTINEL LYMPH NODE DISSECTION;  Surgeon: Griselda Miner, MD;  Location: [facility];  Service: General;  Laterality: Left;   REMOVAL OF TISSUE EXPANDER AND PLACEMENT OF IMPLANT Left 08/12/2022   Procedure: REMOVAL OF TISSUE EXPANDER AND PLACEMENT OF IMPLANT;  Surgeon: Peggye Form, DO;  Location: [facility];  Service: Plastics;  Laterality: Left;   Social History   Tobacco Use   Smoking status: Never   Smokeless tobacco: Never  Vaping Use   Vaping status: Never Used  Substance Use Topics   Alcohol use: No   Drug use: No   Social History   Socioeconomic History   Marital status: Married    Spouse name: Not on file   Number of children: 2   Years of education: Not on file   Highest education level: 12th grade  Occupational History   Occupation: credit reporting    Employer: [employer]    End: 01/19/2019    Comment: retired since 12/2018  Tobacco Use   Smoking status: Never   Smokeless tobacco: Never  Vaping Use   Vaping status: Never Used  Substance and Sexual Activity   Alcohol use: No   Drug use: No   Sexual activity: Yes    Partners: Male    Birth control/protection: Post-menopausal  Other Topics Concern   Not on file  Social History Narrative   Exercise-- walking    Social Drivers of Health   Financial Resource Strain: Low Risk  (07/07/2023)   Overall Financial Resource Strain (CARDIA)    Difficulty of Paying Living Expenses: Not hard at all  Food Insecurity: No Food Insecurity (07/07/2023)   Hunger Vital Sign    Worried About Running Out of Food in the Last Year:  Never true    Ran Out of Food in the Last Year: Never true  Transportation Needs: No Transportation Needs (07/07/2023)   PRAPARE - Administrator, Civil Service (Medical): No    Lack of Transportation (Non-Medical): No  Physical Activity: Insufficiently Active (07/07/2023)   Exercise Vital Sign    Days of Exercise per Week: 3 days    Minutes of Exercise per Session: 30 min  Stress: No Stress Concern Present (07/07/2023)   [employer] of Occupational Health - Occupational Stress Questionnaire    Feeling of Stress : Not at all  Social Connections: Socially Integrated (07/07/2023)   Social Connection and Isolation Panel [NHANES]    Frequency of Communication with Friends and Family: Three times a week  Frequency of Social Gatherings with Friends and Family: Three times a week    Attends Religious Services: More than 4 times per year    Active Member of Clubs or Organizations: No    Attends Banker Meetings: 1 to 4 times per year    Marital Status: Married  Catering manager Violence: Not At Risk (02/13/2022)   Humiliation, Afraid, Rape, and Kick questionnaire    Fear of Current or Ex-Partner: No    Emotionally Abused: No    Physically Abused: No    Sexually Abused: No   Family Status  Relation Name Status   Mother  Deceased at age 24   Father  Alive   Sister  Alive   Brother  Nature conservation officer   Other  (Not Specified)  No partnership data on file   Family History  Problem Relation Age of Onset   Breast cancer Mother    Cancer Mother        breast cancer or precancer   Hypertension Mother    Heart failure Mother    Hypertension Father    Melanoma Father 18   Thyroid disease Other    Allergies  Allergen Reactions   Other Other (See Comments)    Adhesive Strips causes blisters   Azithromycin Rash   Latex Rash      Review of Systems  Constitutional:  Negative for chills, fever and malaise/fatigue.  HENT:  Negative for congestion and  hearing loss.   Eyes:  Negative for blurred vision and discharge.  Respiratory:  Negative for cough, sputum production and shortness of breath.   Cardiovascular:  Negative for chest pain, palpitations and leg swelling.  Gastrointestinal:  Negative for abdominal pain, blood in stool, constipation, diarrhea, heartburn, nausea and vomiting.  Genitourinary:  Negative for dysuria, frequency, hematuria and urgency.  Musculoskeletal:  Negative for back pain, falls and myalgias.  Skin:  Negative for rash.  Neurological:  Negative for dizziness, sensory change, loss of consciousness, weakness and headaches.  Endo/Heme/Allergies:  Negative for environmental allergies. Does not bruise/bleed easily.  Psychiatric/Behavioral:  Negative for depression and suicidal ideas. The patient is not nervous/anxious and does not have insomnia.       Objective:     BP 130/78   Pulse 85   Wt 192 lb (87.1 kg)   SpO2 97%   BMI 30.99 kg/m  BP Readings from Last 3 Encounters:  07/11/23 130/78  04/08/23 (!) 155/87  01/14/23 120/64   Wt Readings from Last 3 Encounters:  07/11/23 192 lb (87.1 kg)  04/14/23 191 lb 2 oz (86.7 kg)  04/08/23 190 lb 6.4 oz (86.4 kg)   SpO2 Readings from Last 3 Encounters:  07/11/23 97%  04/08/23 97%  01/14/23 98%      Physical Exam Vitals and nursing note reviewed.  Constitutional:      General: She is not in acute distress.    Appearance: Normal appearance. She is well-developed.  HENT:     Head: Normocephalic and atraumatic.     Right Ear: Tympanic membrane, ear canal and external ear normal. There is no impacted cerumen.     Left Ear: Tympanic membrane, ear canal and external ear normal. There is no impacted cerumen.     Nose: Nose normal.     Mouth/Throat:     Mouth: Mucous membranes are moist.     Pharynx: Oropharynx is clear. No oropharyngeal exudate or posterior oropharyngeal erythema.  Eyes:     General:  No scleral icterus.       Right eye: No discharge.         Left eye: No discharge.     Conjunctiva/sclera: Conjunctivae normal.     Pupils: Pupils are equal, round, and reactive to light.  Neck:     Thyroid: No thyromegaly or thyroid tenderness.     Vascular: No JVD.  Cardiovascular:     Rate and Rhythm: Normal rate and regular rhythm.     Heart sounds: Normal heart sounds. No murmur heard. Pulmonary:     Effort: Pulmonary effort is normal. No respiratory distress.     Breath sounds: Normal breath sounds.  Abdominal:     General: Bowel sounds are normal. There is no distension.     Palpations: Abdomen is soft. There is no mass.     Tenderness: There is no abdominal tenderness. There is no guarding or rebound.  Genitourinary:    Vagina: Normal.  Musculoskeletal:        General: Normal range of motion.     Cervical back: Normal range of motion and neck supple.     Right lower leg: No edema.     Left lower leg: No edema.  Lymphadenopathy:     Cervical: No cervical adenopathy.  Skin:    General: Skin is warm and dry.     Findings: No erythema or rash.  Neurological:     Mental Status: She is alert and oriented to person, place, and time.     Cranial Nerves: No cranial nerve deficit.     Deep Tendon Reflexes: Reflexes are normal and symmetric.  Psychiatric:        Mood and Affect: Mood normal.        Behavior: Behavior normal.        Thought Content: Thought content normal.        Judgment: Judgment normal.      No results found for any visits on 07/11/23.  Last CBC Lab Results  Component Value Date   WBC 7.9 04/08/2023   HGB 13.1 04/08/2023   HCT 39.1 04/08/2023   MCV 90.5 04/08/2023   MCH 30.3 04/08/2023   RDW 12.5 04/08/2023   PLT 209 04/08/2023   Last metabolic panel Lab Results  Component Value Date   GLUCOSE 104 (H) 04/08/2023   NA 140 04/08/2023   K 3.9 04/08/2023   CL 104 04/08/2023   CO2 29 04/08/2023   BUN 9 04/08/2023   CREATININE 0.66 04/08/2023   GFRNONAA >60 04/08/2023   CALCIUM 10.3 04/08/2023    PROT 7.6 04/08/2023   ALBUMIN 4.6 04/08/2023   BILITOT 0.5 04/08/2023   ALKPHOS 61 04/08/2023   AST 16 04/08/2023   ALT 14 04/08/2023   ANIONGAP 7 04/08/2023   Last lipids Lab Results  Component Value Date   CHOL 106 10/21/2022   HDL 46.20 10/21/2022   LDLCALC 29 10/21/2022   LDLDIRECT 47.0 02/07/2021   TRIG 155.0 (H) 10/21/2022   CHOLHDL 2 10/21/2022   Last hemoglobin A1c Lab Results  Component Value Date   HGBA1C 6.6 (H) 10/21/2022   Last thyroid functions Lab Results  Component Value Date   TSH 0.63 10/21/2022   Last vitamin D No results found for: "25OHVITD2", "25OHVITD3", "VD25OH" Last vitamin B12 and Folate No results found for: "VITAMINB12", "FOLATE"    The ASCVD Risk score (Arnett DK, et al., 2019) failed to calculate for the following reasons:   The valid total cholesterol range is 130 to 320  mg/dL    Assessment & Plan:   Problem List Items Addressed This Visit       Unprioritized   Preventative health care - Primary   Hypothyroidism   Relevant Medications   levothyroxine (SYNTHROID) 112 MCG tablet   Other Relevant Orders   TSH   Essential hypertension   Other Visit Diagnoses       Type 2 diabetes mellitus with hyperglycemia, without long-term current use of insulin (HCC)       Relevant Orders   Comprehensive metabolic panel with GFR   Hemoglobin A1c   Microalbumin / creatinine urine ratio     Hyperlipidemia associated with type 2 diabetes mellitus (HCC)       Relevant Orders   Lipid panel   CBC with Differential/Platelet   Comprehensive metabolic panel with GFR   Microalbumin / creatinine urine ratio     Vitamin D deficiency       Relevant Orders   VITAMIN D 25 Hydroxy (Vit-D Deficiency, Fractures)     Assessment and Plan Assessment & Plan Breast Cancer   She is currently on anastrozole (Arimidex) and experiencing hot flashes and diarrhea. Plans to discuss alternative medications with her oncologist in June. Treatment duration is  based on studies suggesting longer periods, despite a 90% chance of non-recurrence. Follow up with the oncologist in June.  Type 2 Diabetes Mellitus   Her diabetes is managed with metformin, and she maintains a carbohydrate intake of under 75 grams per day. Blood sugar levels are well-controlled. She is not using Ozempic due to concerns from personal research and a friend's negative experience. Continue metformin, monitor blood glucose levels, and maintain dietary carbohydrate restriction.  Hypothyroidism   Managed with levothyroxine. She recently transitioned to a 30-day supply and plans to return to a 90-day supply schedule. Continue levothyroxine as prescribed.  Trigger Thumb   She experiences a clicking sensation and pain, indicative of trigger thumb. Currently using topical treatments and considering dual-action pain relief with ibuprofen and acetaminophen. Prefers to wait before seeking specialist care. Use dual-action pain relief, apply heat to the affected area, and consider referral to [facility] if symptoms worsen.  General Health Maintenance   She takes vitamins D, E, C, and a multivitamin. Needs to schedule an eye exam. Completed a mammogram in February and is due for a bone density scan in December. Declines pneumonia vaccination at this time. Schedule eye exam, schedule bone density scan for December, and continue current vitamin regimen.    No follow-ups on file.    Donato Schultz, DO

## 2023-07-14 ENCOUNTER — Ambulatory Visit: Payer: Medicare Other | Attending: General Surgery

## 2023-07-14 VITALS — Wt 193.5 lb

## 2023-07-14 DIAGNOSIS — Z483 Aftercare following surgery for neoplasm: Secondary | ICD-10-CM | POA: Insufficient documentation

## 2023-07-14 NOTE — Therapy (Signed)
 OUTPATIENT PHYSICAL THERAPY SOZO SCREENING NOTE   Patient Name: Diane Hodges MRN: [id number] DOB:June 10, 1955, 68 y.o., female Today's Date: 07/14/2023  PCP: Donato Schultz, DO REFERRING PROVIDER: Griselda Miner, MD   PT End of Session - 07/14/23 1547     Visit Number 4   # unchanged due to screen only   PT Start Time 1545    PT Stop Time 1549    PT Time Calculation (min) 4 min    Activity Tolerance Patient tolerated treatment well    Behavior During Therapy [facility] for tasks assessed/performed             Past Medical History:  Diagnosis Date   Atypical nevus 04/12/2014   mid back -mild   DM (diabetes mellitus), type 2 (HCC)    Hyperlipemia    Hypertension    PONV (postoperative nausea and vomiting)    with one surgery   Thyroid disease    Hypothyridism   Past Surgical History:  Procedure Laterality Date   BREAST BIOPSY Left 04/08/2022   MM LT BREAST BX W LOC DEV EA AD LESION IMG BX SPEC STEREO GUIDE 04/08/2022 [facility]   BREAST BIOPSY Left 04/08/2022   MM LT BREAST BX W LOC DEV 1ST LESION IMAGE BX SPEC STEREO GUIDE 04/08/2022 [facility]   BREAST BIOPSY Left 06/21/2022   [place] LT RADIOACTIVE SEED LOC 06/21/2022 [facility]   BREAST RECONSTRUCTION WITH PLACEMENT OF TISSUE EXPANDER AND FLEX HD (ACELLULAR HYDRATED DERMIS) Left 06/24/2022   Procedure: IMMEDIATE LEFT BREAST RECONSTRUCTION WITH PLACEMENT OF TISSUE EXPANDER AND FLEX HD (ACELLULAR HYDRATED DERMIS);  Surgeon: Peggye Form, DO;  Location: [facility];  Service: Plastics;  Laterality: Left;   CATARACT EXTRACTION Right 04/01/2013   MASTECTOMY Left 06/24/2022   MASTECTOMY W/ SENTINEL NODE BIOPSY Left 06/24/2022   Procedure: LEFT MASTECTOMY WITH SENTINEL LYMPH NODE BIOPSY;  Surgeon: Griselda Miner, MD;  Location: [facility];  Service: General;  Laterality: Left;   OOPHORECTOMY  04/01/1978   RADIOACTIVE SEED GUIDED AXILLARY SENTINEL LYMPH NODE Left  06/24/2022   Procedure: RADIOACTIVE SEED GUIDED LEFT AXILLARY SENTINEL LYMPH NODE DISSECTION;  Surgeon: Griselda Miner, MD;  Location: [facility];  Service: General;  Laterality: Left;   REMOVAL OF TISSUE EXPANDER AND PLACEMENT OF IMPLANT Left 08/12/2022   Procedure: REMOVAL OF TISSUE EXPANDER AND PLACEMENT OF IMPLANT;  Surgeon: Peggye Form, DO;  Location: [facility];  Service: Plastics;  Laterality: Left;   Patient Active Problem List   Diagnosis Date Noted   Breast cancer (HCC) 06/24/2022   Carcinoma of central portion of left breast in female, estrogen receptor positive (HCC) 04/17/2022   Malignant neoplasm of overlapping sites of left breast in female, estrogen receptor positive (HCC) 04/16/2022   Preventative health care 07/30/2021   Uncontrolled type 2 diabetes mellitus with hyperglycemia (HCC) 06/26/2020   Anxiety 02/20/2013   Obesity (BMI 30-39.9) 02/19/2013   Hyperlipidemia 10/16/2009   Essential hypertension 12/26/2008   ALLERGIC RHINITIS 11/12/2007   COMMON MIGRAINE 12/08/2006   SKIN RASH 12/08/2006   Hypothyroidism 10/20/2006   BRONCHITIS, ACUTE 10/20/2006    REFERRING DIAG: left breast cancer at risk for lymphedema  THERAPY DIAG: Aftercare following surgery for neoplasm  PERTINENT HISTORY: Patient was diagnosed on 03/05/2022 with left grade 1 invasive lobular carcinoma breast cancer. She underwent a left mastectomy and axillary lymph node dissection on 06/24/2022. It is ER/PR positive and HER2 negative with a Ki67 of 2%.  1/4 LN's   PRECAUTIONS: left UE Lymphedema risk  SUBJECTIVE: Pt returns for her 3 month L-Dex screen.   PAIN:  Are you having pain? No  SOZO SCREENING: Patient was assessed today using the SOZO machine to determine the lymphedema index score. This was compared to her baseline score. It was determined that she is within the recommended range when compared to her baseline and no further action is needed at this time. She will continue  SOZO screenings. These are done every 3 months for 2 years post operatively followed by every 6 months for 2 years, and then annually.   L-DEX FLOWSHEETS - 07/14/23 1500       L-DEX LYMPHEDEMA SCREENING   Measurement Type Unilateral    L-DEX MEASUREMENT EXTREMITY Upper Extremity    POSITION  Standing    DOMINANT SIDE Right    At Risk Side Left    BASELINE SCORE (UNILATERAL) -0.1    L-DEX SCORE (UNILATERAL) -4.4    VALUE CHANGE (UNILAT) -4.3            Diane Hodges, PTA 07/14/23 3:48 PM

## 2023-07-17 ENCOUNTER — Other Ambulatory Visit: Payer: Self-pay | Admitting: Family Medicine

## 2023-07-17 ENCOUNTER — Other Ambulatory Visit: Payer: Self-pay | Admitting: Hematology

## 2023-07-17 DIAGNOSIS — E785 Hyperlipidemia, unspecified: Secondary | ICD-10-CM

## 2023-07-18 ENCOUNTER — Encounter: Payer: Self-pay | Admitting: Family Medicine

## 2023-07-21 ENCOUNTER — Other Ambulatory Visit: Payer: Self-pay

## 2023-07-21 MED ORDER — VITAMIN D (ERGOCALCIFEROL) 1.25 MG (50000 UNIT) PO CAPS: 50000.0000 [IU] | ORAL_CAPSULE | ORAL | 1 refills | Status: DC

## 2023-08-01 ENCOUNTER — Other Ambulatory Visit: Payer: Self-pay | Admitting: Family Medicine

## 2023-09-10 ENCOUNTER — Other Ambulatory Visit: Payer: Self-pay | Admitting: Family Medicine

## 2023-09-10 DIAGNOSIS — I10 Essential (primary) hypertension: Secondary | ICD-10-CM

## 2023-10-13 NOTE — Assessment & Plan Note (Signed)
 invasive lobular carcinoma, cT3N1M0, stage IIA, ER+/PR+/HER2-, G1  -diagnosed in 04/2022 -Given the lobular histology, she underwent breast MRI, which showed 6.1 cm mass in the central of left breast, with 1 abnormal axillary lymph node. -Staging bone scan showed abnormal uptake in L2 and L3, we obtained spinal MRI to evaluate the bone metastasis which was negative. -CT chest abdomen pelvis with contrast was negative for metastatic disease. -She underwent left breast mastectomy and tolerated lymph node biopsy in March 2024.  I reviewed her surgical pathology findings with her in detail -Her Oncotype showed recurrence score 2, predicts her risk of distant recurrence in the next 90 days 9%, no adjuvant chemotherapy is recommended.  -She completed adjuvant radiation -She started adjuvant anastrozole  in August 2024, tolerating well overall

## 2023-10-14 ENCOUNTER — Encounter: Payer: Self-pay | Admitting: Hematology

## 2023-10-14 ENCOUNTER — Inpatient Hospital Stay: Payer: Medicare Other | Admitting: Hematology

## 2023-10-14 ENCOUNTER — Inpatient Hospital Stay: Payer: Medicare Other | Attending: Hematology

## 2023-10-14 DIAGNOSIS — Z1732 Human epidermal growth factor receptor 2 negative status: Secondary | ICD-10-CM | POA: Insufficient documentation

## 2023-10-14 DIAGNOSIS — R232 Flushing: Secondary | ICD-10-CM | POA: Insufficient documentation

## 2023-10-14 DIAGNOSIS — R197 Diarrhea, unspecified: Secondary | ICD-10-CM | POA: Diagnosis not present

## 2023-10-14 DIAGNOSIS — R918 Other nonspecific abnormal finding of lung field: Secondary | ICD-10-CM | POA: Diagnosis not present

## 2023-10-14 DIAGNOSIS — Z1721 Progesterone receptor positive status: Secondary | ICD-10-CM | POA: Diagnosis not present

## 2023-10-14 DIAGNOSIS — Z87891 Personal history of nicotine dependence: Secondary | ICD-10-CM | POA: Insufficient documentation

## 2023-10-14 DIAGNOSIS — Z170 Estrogen receptor positive status [ER+]: Secondary | ICD-10-CM | POA: Diagnosis not present

## 2023-10-14 DIAGNOSIS — M2550 Pain in unspecified joint: Secondary | ICD-10-CM | POA: Insufficient documentation

## 2023-10-14 DIAGNOSIS — Z79811 Long term (current) use of aromatase inhibitors: Secondary | ICD-10-CM | POA: Insufficient documentation

## 2023-10-14 DIAGNOSIS — C50812 Malignant neoplasm of overlapping sites of left female breast: Secondary | ICD-10-CM | POA: Insufficient documentation

## 2023-10-14 DIAGNOSIS — Z923 Personal history of irradiation: Secondary | ICD-10-CM | POA: Diagnosis not present

## 2023-10-14 DIAGNOSIS — Z9012 Acquired absence of left breast and nipple: Secondary | ICD-10-CM | POA: Insufficient documentation

## 2023-10-14 DIAGNOSIS — E559 Vitamin D deficiency, unspecified: Secondary | ICD-10-CM | POA: Diagnosis not present

## 2023-10-14 LAB — CBC WITH DIFFERENTIAL (CANCER CENTER ONLY)
Abs Immature Granulocytes: 0.01 K/uL (ref 0.00–0.07)
Basophils Absolute: 0 K/uL (ref 0.0–0.1)
Basophils Relative: 1 %
Eosinophils Absolute: 0.2 K/uL (ref 0.0–0.5)
Eosinophils Relative: 4 %
HCT: 38.4 % (ref 36.0–46.0)
Hemoglobin: 12.9 g/dL (ref 12.0–15.0)
Immature Granulocytes: 0 %
Lymphocytes Relative: 31 %
Lymphs Abs: 1.6 K/uL (ref 0.7–4.0)
MCH: 30.9 pg (ref 26.0–34.0)
MCHC: 33.6 g/dL (ref 30.0–36.0)
MCV: 91.9 fL (ref 80.0–100.0)
Monocytes Absolute: 0.4 K/uL (ref 0.1–1.0)
Monocytes Relative: 8 %
Neutro Abs: 2.8 K/uL (ref 1.7–7.7)
Neutrophils Relative %: 56 %
Platelet Count: 187 K/uL (ref 150–400)
RBC: 4.18 MIL/uL (ref 3.87–5.11)
RDW: 12.5 % (ref 11.5–15.5)
WBC Count: 5 K/uL (ref 4.0–10.5)
nRBC: 0 % (ref 0.0–0.2)

## 2023-10-14 MED ORDER — EXEMESTANE 25 MG PO TABS: 25.0000 mg | ORAL_TABLET | Freq: Every day | ORAL | 5 refills | Status: DC

## 2023-10-14 NOTE — Progress Notes (Signed)
 [facility]   [phone] [phone]   Clinic Follow up Note   Patient Care Team: Antonio Meth, Jamee SAUNDERS, DO as PCP - General Heide Ingle, MD as Consulting Physician (Orthopedic Surgery) Tyree Nanetta SAILOR, RN as Oncology Nurse Navigator Glean, Stephane BROCKS, RN (Inactive) as Oncology Nurse Navigator Lanny Callander, MD as Consulting Physician (Hematology) Curvin Deward MOULD, MD as Consulting Physician (General Surgery) Burton, Lacie K, NP as Nurse Practitioner (Nurse Practitioner)  Date of Service:  10/14/2023  CHIEF COMPLAINT: f/u of breast cancer  CURRENT THERAPY:  Adjuvant anastrozole  1 mg daily  Oncology History   Malignant neoplasm of overlapping sites of left breast in female, estrogen receptor positive (HCC) invasive lobular carcinoma, cT3N1M0, stage IIA, ER+/PR+/HER2-, G1  -diagnosed in 04/2022 -Given the lobular histology, she underwent breast MRI, which showed 6.1 cm mass in the central of left breast, with 1 abnormal axillary lymph node. -Staging bone scan showed abnormal uptake in L2 and L3, we obtained spinal MRI to evaluate the bone metastasis which was negative. -CT chest abdomen pelvis with contrast was negative for metastatic disease. -She underwent left breast mastectomy and tolerated lymph node biopsy in March 2024.  I reviewed her surgical pathology findings with her in detail -Her Oncotype showed recurrence score 2, predicts her risk of distant recurrence in the next 90 days 9%, no adjuvant chemotherapy is recommended.  -She completed adjuvant radiation -She started adjuvant anastrozole  in August 2024, tolerating well overall   Assessment & Plan Breast cancer Follow-up for breast cancer. Recent CT chest in March showed well-managed small lung nodules. No new nodules or masses detected on physical exam. Mastectomy and implant site appear normal with no palpable nodules. Persistent soreness at the site of previous drainage tube, likely due  to scar tissue. - Repeat CT chest in March 2026 to monitor lung nodules. - Continue regular follow-up every six months.  Hot flashes due to anastrozole  Hot flashes likely due to anastrozole , causing significant discomfort. Hot flashes are a common side effect of anastrozole  and may improve over time as the body adjusts. - Discontinue anastrozole  for one week to assess symptom resolution. - Start exemestane  after one week if symptoms improve.  Joint pain due to anastrozole  Joint pain, particularly in fingers, likely due to anastrozole . Reports stiffness and difficulty bending fingers, which improves with collagen supplementation. Joint pain is a common side effect of anastrozole  and may persist even after discontinuation. Exemestane  may have less joint pain compared to anastrozole . - Discontinue anastrozole  for one week to assess symptom resolution. - Start exemestane  after one week if symptoms improve. - Encourage continuation of collagen supplementation. - Recommend exercise, particularly strength training and core exercises, to help alleviate joint pain.  Diarrhea possibly due to anastrozole  Diarrhea occurring multiple times daily, likely related to anastrozole . Diarrhea is not a common side effect of anastrozole , but symptoms began after starting the medication. Potential switch to exemestane , which has a lower incidence of diarrhea, was discussed. - Discontinue anastrozole  for one week to assess symptom resolution. - Start exemestane  after one week if symptoms improve. - Consider use of Imodium for symptomatic relief if diarrhea persists.  Vitamin D  deficiency Vitamin D  deficiency identified in April. Currently managed with weekly vitamin D  supplementation. Diarrhea may contribute to vitamin D  deficiency. - Continue weekly vitamin D  supplementation.   Plan -Due to her diarrhea and joint pain, will stop anastrozole . - I prescribed exemestane  25 mg daily, she will start 1 week after  she stops  anastrozole  - Lab and follow-up with NP in 6 months, sooner if needed.     SUMMARY OF ONCOLOGIC HISTORY: Oncology History Overview Note   Cancer Staging  Malignant neoplasm of overlapping sites of left breast in female, estrogen receptor positive (HCC) Staging form: Breast, AJCC 8th Edition - Clinical stage from 04/08/2022: Stage Unknown (cTX, cN0, cM0, G1, ER+, PR+, HER2-) - Signed by Lanny Callander, MD on 04/16/2022 Stage prefix: Initial diagnosis Histologic grading system: 3 grade system     Malignant neoplasm of overlapping sites of left breast in female, estrogen receptor positive (HCC)  03/20/2022 Imaging    IMPRESSION: 1. Suspicious architectural distortion within the central/retroareolar LEFT breast, best seen on spot compression CC slice 29, without sonographic correlate. Stereotactic biopsy is recommended. 2. Additional possible subtle architectural distortion within the outer LEFT breast, best seen on spot compression CC slice 46, without sonographic correlate. Stereotactic biopsy is recommended.     04/08/2022 Procedure    FINDINGS: 3D Mammographic images were obtained following stereotactic core biopsies of left breast distortion. There is a ribbon and coil shaped clip in the upper aspect of the breast in appropriate position.     04/16/2022 Initial Diagnosis   Malignant neoplasm of overlapping sites of left breast in female, estrogen receptor positive (HCC)   06/24/2022 Cancer Staging   Staging form: Breast, AJCC 8th Edition - Pathologic stage from 06/24/2022: Stage IB (pT3, pN1a, cM0, G2, ER+, PR+, HER2-) - Signed by Lanny Callander, MD on 10/17/2022 Histologic grading system: 3 grade system Residual tumor (R): R0 - None      Discussed the use of AI scribe software for clinical note transcription with the patient, who gave verbal consent to proceed.  History of Present Illness Diane Hodges is a 68 year old female with breast cancer who presents for  follow-up.  She experiences frequent hot flashes since starting anastrozole , reminiscent of menopause. Diarrhea, described as 'pure water,' occurs multiple times in the morning and began a couple of months after starting anastrozole . She manages it with Imodium, Pepto, and dietary adjustments. Joint pain and stiffness, particularly in her fingers, are present and attributed to anastrozole . Collagen supplementation helps reduce stiffness, which improves during the day.  A CT chest in March was performed for follow-up of a lung nodule. She has a history of smoking four to five cigarettes a day for about eight years but quit years ago.  She was found to have a vitamin D  deficiency in April and takes a vitamin D  supplement weekly. Back tenderness is noted, particularly in the morning, and she engages in walking but acknowledges a need for more stretching and strength training exercises.     All other systems were reviewed with the patient and are negative.  MEDICAL HISTORY:  Past Medical History:  Diagnosis Date   Atypical nevus 04/12/2014   mid back -mild   DM (diabetes mellitus), type 2 (HCC)    Hyperlipemia    Hypertension    PONV (postoperative nausea and vomiting)    with one surgery   Thyroid  disease    Hypothyridism    SURGICAL HISTORY: Past Surgical History:  Procedure Laterality Date   BREAST BIOPSY Left 04/08/2022   MM LT BREAST BX W LOC DEV EA AD LESION IMG BX SPEC STEREO GUIDE 04/08/2022 [facility]   BREAST BIOPSY Left 04/08/2022   MM LT BREAST BX W LOC DEV 1ST LESION IMAGE BX SPEC STEREO GUIDE 04/08/2022 [facility]   BREAST BIOPSY Left 06/21/2022  US  LT RADIOACTIVE SEED LOC 06/21/2022 [facility]   BREAST RECONSTRUCTION WITH PLACEMENT OF TISSUE EXPANDER AND FLEX HD (ACELLULAR HYDRATED DERMIS) Left 06/24/2022   Procedure: IMMEDIATE LEFT BREAST RECONSTRUCTION WITH PLACEMENT OF TISSUE EXPANDER AND FLEX HD (ACELLULAR HYDRATED DERMIS);  Surgeon: Lowery Estefana RAMAN, DO;  Location: [facility];  Service: Plastics;  Laterality: Left;   CATARACT EXTRACTION Right 04/01/2013   MASTECTOMY Left 06/24/2022   MASTECTOMY W/ SENTINEL NODE BIOPSY Left 06/24/2022   Procedure: LEFT MASTECTOMY WITH SENTINEL LYMPH NODE BIOPSY;  Surgeon: Curvin Deward MOULD, MD;  Location: [facility];  Service: General;  Laterality: Left;   OOPHORECTOMY  04/01/1978   RADIOACTIVE SEED GUIDED AXILLARY SENTINEL LYMPH NODE Left 06/24/2022   Procedure: RADIOACTIVE SEED GUIDED LEFT AXILLARY SENTINEL LYMPH NODE DISSECTION;  Surgeon: Curvin Deward MOULD, MD;  Location: [facility];  Service: General;  Laterality: Left;   REMOVAL OF TISSUE EXPANDER AND PLACEMENT OF IMPLANT Left 08/12/2022   Procedure: REMOVAL OF TISSUE EXPANDER AND PLACEMENT OF IMPLANT;  Surgeon: Lowery Estefana RAMAN, DO;  Location: [facility];  Service: Plastics;  Laterality: Left;    I have reviewed the social history and family history with the patient and they are unchanged from previous note.  ALLERGIES:  is allergic to other, azithromycin, and latex.  MEDICATIONS:  Current Outpatient Medications  Medication Sig Dispense Refill   exemestane  (AROMASIN ) 25 MG tablet Take 1 tablet (25 mg total) by mouth daily after breakfast. 30 tablet 5   anastrozole  (ARIMIDEX ) 1 MG tablet TAKE 1 TABLET BY MOUTH EVERY DAY 90 tablet 1   ascorbic acid (VITAMIN C) 500 MG tablet Take 500 mg by mouth daily.     blood glucose meter kit and supplies KIT Dispense based on patient and insurance preference. Use up to four times daily as directed. (FOR ICD-9 250.00, 250.01). 1 each 0   carvedilol  (COREG ) 6.25 MG tablet Take 1 tablet (6.25 mg total) by mouth 2 (two) times daily with a meal. 180 tablet 0   Cholecalciferol (VITAMIN D ) 50 MCG (2000 UT) tablet Take 2,000 Units by mouth daily.     fenofibrate  54 MG tablet TAKE 1 TABLET BY MOUTH DAILY 90 tablet 1   glucose blood test strip Onetouch verio test strips.  Check blood sugar once a day 100 each 0   levothyroxine  (SYNTHROID ) 112 MCG tablet Take 1 tablet (112 mcg total) by mouth daily before breakfast. Pt needs office visit or labs for further refills 90 tablet 3   metFORMIN  (GLUCOPHAGE ) 500 MG tablet TAKE 1 TABLET BY MOUTH 2 TIMES DAILY WITH A MEAL. 180 tablet 1   Multiple Vitamin (MULTIVITAMIN ADULT) TABS Take 1 tablet by mouth daily.     ONETOUCH DELICA LANCETS FINE MISC 1 Device by Does not apply route daily. 100 each 0   rosuvastatin  (CRESTOR ) 10 MG tablet TAKE 1 TABLET BY MOUTH EVERY DAY 90 tablet 1   triamcinolone  cream (KENALOG ) 0.1 % Apply 1 Application topically daily as needed. 30 g 4   Vitamin D , Ergocalciferol , (DRISDOL ) 1.25 MG (50000 UNIT) CAPS capsule Take 1 capsule (50,000 Units total) by mouth every 7 (seven) days. 12 capsule 1   No current facility-administered medications for this visit.    PHYSICAL EXAMINATION: ECOG PERFORMANCE STATUS: 1 - Symptomatic but completely ambulatory  Vitals:   10/14/23 1050  BP: 118/76  Pulse: 88  Resp: 16  Temp: 98.2 F (36.8 C)  SpO2: 97%   Wt Readings from Last  3 Encounters:  10/14/23 192 lb 9.6 oz (87.4 kg)  07/14/23 193 lb 8 oz (87.8 kg)  07/11/23 192 lb (87.1 kg)     GENERAL:alert, no distress and comfortable SKIN: skin color, texture, turgor are normal, no rashes or significant lesions EYES: normal, Conjunctiva are pink and non-injected, sclera clear NECK: supple, thyroid  normal size, non-tender, without nodularity LYMPH:  no palpable lymphadenopathy in the cervical, axillary  LUNGS: clear to auscultation and percussion with normal breathing effort HEART: regular rate & rhythm and no murmurs and no lower extremity edema ABDOMEN:abdomen soft, non-tender and normal bowel sounds Musculoskeletal:no cyanosis of digits and no clubbing  NEURO: alert & oriented x 3 with fluent speech, no focal motor/sensory deficits BREAST: Breasts symmetrical, no nodules or axillary  adenopathy Physical Exam   LABORATORY DATA:  I have reviewed the data as listed    Latest Ref Rng & Units 10/14/2023   10:36 AM 07/11/2023   10:57 AM 04/08/2023   12:01 PM  CBC  WBC 4.0 - 10.5 K/uL 5.0  4.6  7.9   Hemoglobin 12.0 - 15.0 g/dL 87.0  86.5  86.8   Hematocrit 36.0 - 46.0 % 38.4  39.9  39.1   Platelets 150 - 400 K/uL 187  175.0  209         Latest Ref Rng & Units 07/11/2023   10:57 AM 04/08/2023   12:01 PM 10/21/2022    9:26 AM  CMP  Glucose 70 - 99 mg/dL 884  895  875   BUN 6 - 23 mg/dL 11  9  8    Creatinine 0.40 - 1.20 mg/dL 9.43  9.33  9.23   Sodium 135 - 145 mEq/L 144  140  142   Potassium 3.5 - 5.1 mEq/L 4.4  3.9  4.0   Chloride 96 - 112 mEq/L 106  104  104   CO2 19 - 32 mEq/L 30  29  25    Calcium  8.4 - 10.5 mg/dL 9.5  89.6  89.9   Total Protein 6.0 - 8.3 g/dL 6.8  7.6  7.2   Total Bilirubin 0.2 - 1.2 mg/dL 0.5  0.5  0.4   Alkaline Phos 39 - 117 U/L 57  61  56   AST 0 - 37 U/L 16  16  20    ALT 0 - 35 U/L 13  14  18        RADIOGRAPHIC STUDIES: I have personally reviewed the radiological images as listed and agreed with the findings in the report. No results found.    Orders Placed This Encounter  Procedures   Comprehensive metabolic panel with GFR    Standing Status:   Standing    Number of Occurrences:   50    Expiration Date:   10/13/2024   All questions were answered. The patient knows to call the clinic with any problems, questions or concerns. No barriers to learning was detected. The total time spent in the appointment was 30 minutes, including review of chart and various tests results, discussions about plan of care and coordination of care plan     Onita Mattock, MD 10/14/2023

## 2023-10-21 ENCOUNTER — Telehealth: Payer: Self-pay

## 2023-10-21 ENCOUNTER — Other Ambulatory Visit: Payer: Self-pay

## 2023-10-21 MED ORDER — ANASTROZOLE 1 MG PO TABS: 1.0000 mg | ORAL_TABLET | Freq: Every day | ORAL | 1 refills | Status: AC

## 2023-10-21 NOTE — Telephone Encounter (Signed)
 Pt called stating when she was last in office with Dr. Lanny, they discussed changing pt from Anastrozole  to Exemestane .  Pt stated she has spoke with her pharmacist and the Exemestane  is a Tier 3 drug for her insurance; therefore, the copay is $70 for a 30day supply which is too expensive per the pt.  Pt stated the side effects of the exemestane  is also greater than those of the Anastrozole  she's taking.  Pt stated with this being said, she would like to remain on Anastrozole .  Stated this nurse will make Dr. Lanny aware of the pt's preference.

## 2023-10-27 ENCOUNTER — Ambulatory Visit: Attending: General Surgery

## 2023-10-27 VITALS — Wt 191.0 lb

## 2023-10-27 DIAGNOSIS — Z483 Aftercare following surgery for neoplasm: Secondary | ICD-10-CM | POA: Insufficient documentation

## 2023-10-27 NOTE — Therapy (Signed)
 OUTPATIENT PHYSICAL THERAPY SOZO SCREENING NOTE   Patient Name: Diane Hodges MRN: [id number] DOB:22-Sep-1955, 68 y.o., female Today's Date: 10/27/2023  PCP: Antonio Cyndee Jamee JONELLE, DO REFERRING PROVIDER: Curvin Deward MOULD, MD   PT End of Session - 10/27/23 1509     Visit Number 4   # unchanged due to screen only   PT Start Time 1507    PT Stop Time 1511    PT Time Calculation (min) 4 min    Activity Tolerance Patient tolerated treatment well    Behavior During Therapy [facility] for tasks assessed/performed          Past Medical History:  Diagnosis Date   Atypical nevus 04/12/2014   mid back -mild   DM (diabetes mellitus), type 2 (HCC)    Hyperlipemia    Hypertension    PONV (postoperative nausea and vomiting)    with one surgery   Thyroid  disease    Hypothyridism   Past Surgical History:  Procedure Laterality Date   BREAST BIOPSY Left 04/08/2022   MM LT BREAST BX W LOC DEV EA AD LESION IMG BX SPEC STEREO GUIDE 04/08/2022 [facility]   BREAST BIOPSY Left 04/08/2022   MM LT BREAST BX W LOC DEV 1ST LESION IMAGE BX SPEC STEREO GUIDE 04/08/2022 [facility]   BREAST BIOPSY Left 06/21/2022   US  LT RADIOACTIVE SEED LOC 06/21/2022 [facility]   BREAST RECONSTRUCTION WITH PLACEMENT OF TISSUE EXPANDER AND FLEX HD (ACELLULAR HYDRATED DERMIS) Left 06/24/2022   Procedure: IMMEDIATE LEFT BREAST RECONSTRUCTION WITH PLACEMENT OF TISSUE EXPANDER AND FLEX HD (ACELLULAR HYDRATED DERMIS);  Surgeon: Lowery Estefana RAMAN, DO;  Location: [facility];  Service: Plastics;  Laterality: Left;   CATARACT EXTRACTION Right 04/01/2013   MASTECTOMY Left 06/24/2022   MASTECTOMY W/ SENTINEL NODE BIOPSY Left 06/24/2022   Procedure: LEFT MASTECTOMY WITH SENTINEL LYMPH NODE BIOPSY;  Surgeon: Curvin Deward MOULD, MD;  Location: [facility];  Service: General;  Laterality: Left;   OOPHORECTOMY  04/01/1978   RADIOACTIVE SEED GUIDED AXILLARY SENTINEL LYMPH NODE Left  06/24/2022   Procedure: RADIOACTIVE SEED GUIDED LEFT AXILLARY SENTINEL LYMPH NODE DISSECTION;  Surgeon: Curvin Deward MOULD, MD;  Location: [facility];  Service: General;  Laterality: Left;   REMOVAL OF TISSUE EXPANDER AND PLACEMENT OF IMPLANT Left 08/12/2022   Procedure: REMOVAL OF TISSUE EXPANDER AND PLACEMENT OF IMPLANT;  Surgeon: Lowery Estefana RAMAN, DO;  Location: [facility];  Service: Plastics;  Laterality: Left;   Patient Active Problem List   Diagnosis Date Noted   Breast cancer (HCC) 06/24/2022   Carcinoma of central portion of left breast in female, estrogen receptor positive (HCC) 04/17/2022   Malignant neoplasm of overlapping sites of left breast in female, estrogen receptor positive (HCC) 04/16/2022   Preventative health care 07/30/2021   Uncontrolled type 2 diabetes mellitus with hyperglycemia (HCC) 06/26/2020   Anxiety 02/20/2013   Obesity (BMI 30-39.9) 02/19/2013   Hyperlipidemia 10/16/2009   Essential hypertension 12/26/2008   ALLERGIC RHINITIS 11/12/2007   COMMON MIGRAINE 12/08/2006   SKIN RASH 12/08/2006   Hypothyroidism 10/20/2006   BRONCHITIS, ACUTE 10/20/2006    REFERRING DIAG: left breast cancer at risk for lymphedema  THERAPY DIAG: Aftercare following surgery for neoplasm  PERTINENT HISTORY: Patient was diagnosed on 03/05/2022 with left grade 1 invasive lobular carcinoma breast cancer. She underwent a left mastectomy and axillary lymph node dissection on 06/24/2022. It is ER/PR positive and HER2 negative with a Ki67 of 2%. 1/4 LN's  PRECAUTIONS: left UE Lymphedema risk  SUBJECTIVE: Pt returns for her 3 month L-Dex screen.   PAIN:  Are you having pain? No  SOZO SCREENING: Patient was assessed today using the SOZO machine to determine the lymphedema index score. This was compared to her baseline score. It was determined that she is within the recommended range when compared to her baseline and no further action is needed at this time. She will continue  SOZO screenings. These are done every 3 months for 2 years post operatively followed by every 6 months for 2 years, and then annually.   L-DEX FLOWSHEETS - 10/27/23 1500       L-DEX LYMPHEDEMA SCREENING   Measurement Type Unilateral    L-DEX MEASUREMENT EXTREMITY Upper Extremity    POSITION  Standing    DOMINANT SIDE Right    At Risk Side Left    BASELINE SCORE (UNILATERAL) -0.1    L-DEX SCORE (UNILATERAL) -1.1    VALUE CHANGE (UNILAT) -1         Berwyn Knights, PTA 10/27/23 3:11 PM

## 2023-12-02 ENCOUNTER — Telehealth: Payer: Self-pay | Admitting: Family Medicine

## 2023-12-02 NOTE — Telephone Encounter (Signed)
 Copied from CRM #[id number]. Topic: Medicare AWV >> Dec 02, 2023 11:33 AM Nathanel DEL wrote: Reason for CRM: Called 12/02/2023 to sched AWV - NO VOICEMAIL  Nathanel Paschal; Care Guide Ambulatory Clinical Support [place] [facility] Direct Dial: [phone]

## 2023-12-09 ENCOUNTER — Other Ambulatory Visit: Payer: Self-pay | Admitting: Family Medicine

## 2023-12-09 DIAGNOSIS — I10 Essential (primary) hypertension: Secondary | ICD-10-CM

## 2023-12-12 ENCOUNTER — Encounter: Payer: Self-pay | Admitting: Family Medicine

## 2023-12-12 ENCOUNTER — Ambulatory Visit (INDEPENDENT_AMBULATORY_CARE_PROVIDER_SITE_OTHER): Admitting: Family Medicine

## 2023-12-12 ENCOUNTER — Other Ambulatory Visit: Payer: Self-pay | Admitting: Family Medicine

## 2023-12-12 VITALS — BP 102/68 | HR 91 | Temp 98.3°F | Resp 18 | Ht 65.0 in | Wt 193.0 lb

## 2023-12-12 DIAGNOSIS — E1169 Type 2 diabetes mellitus with other specified complication: Secondary | ICD-10-CM | POA: Diagnosis not present

## 2023-12-12 DIAGNOSIS — E559 Vitamin D deficiency, unspecified: Secondary | ICD-10-CM | POA: Diagnosis not present

## 2023-12-12 DIAGNOSIS — E1165 Type 2 diabetes mellitus with hyperglycemia: Secondary | ICD-10-CM

## 2023-12-12 DIAGNOSIS — E785 Hyperlipidemia, unspecified: Secondary | ICD-10-CM

## 2023-12-12 DIAGNOSIS — I10 Essential (primary) hypertension: Secondary | ICD-10-CM | POA: Diagnosis not present

## 2023-12-12 DIAGNOSIS — E039 Hypothyroidism, unspecified: Secondary | ICD-10-CM

## 2023-12-12 DIAGNOSIS — Z7984 Long term (current) use of oral hypoglycemic drugs: Secondary | ICD-10-CM | POA: Diagnosis not present

## 2023-12-12 DIAGNOSIS — Z1211 Encounter for screening for malignant neoplasm of colon: Secondary | ICD-10-CM

## 2023-12-12 LAB — CBC WITH DIFFERENTIAL/PLATELET
Basophils Absolute: 0 K/uL (ref 0.0–0.1)
Basophils Relative: 0.6 % (ref 0.0–3.0)
Eosinophils Absolute: 0.2 K/uL (ref 0.0–0.7)
Eosinophils Relative: 4.5 % (ref 0.0–5.0)
HCT: 39.2 % (ref 36.0–46.0)
Hemoglobin: 13.1 g/dL (ref 12.0–15.0)
Lymphocytes Relative: 32.9 % (ref 12.0–46.0)
Lymphs Abs: 1.5 K/uL (ref 0.7–4.0)
MCHC: 33.5 g/dL (ref 30.0–36.0)
MCV: 91.7 fl (ref 78.0–100.0)
Monocytes Absolute: 0.3 K/uL (ref 0.1–1.0)
Monocytes Relative: 7.4 % (ref 3.0–12.0)
Neutro Abs: 2.5 K/uL (ref 1.4–7.7)
Neutrophils Relative %: 54.6 % (ref 43.0–77.0)
Platelets: 179 K/uL (ref 150.0–400.0)
RBC: 4.28 Mil/uL (ref 3.87–5.11)
RDW: 13.2 % (ref 11.5–15.5)
WBC: 4.5 K/uL (ref 4.0–10.5)

## 2023-12-12 LAB — HEMOGLOBIN A1C: Hgb A1c MFr Bld: 6.6 % — ABNORMAL HIGH (ref 4.6–6.5)

## 2023-12-12 LAB — LIPID PANEL
Cholesterol: 113 mg/dL (ref 0–200)
HDL: 50.3 mg/dL (ref >39.00)
LDL Cholesterol: 36 mg/dL (ref 0–99)
NonHDL: 63.03
Total CHOL/HDL Ratio: 2
Triglycerides: 136 mg/dL (ref 0.0–149.0)
VLDL: 27.2 mg/dL (ref 0.0–40.0)

## 2023-12-12 LAB — COMPREHENSIVE METABOLIC PANEL WITH GFR
ALT: 13 U/L (ref 0–35)
AST: 16 U/L (ref 0–37)
Albumin: 4.6 g/dL (ref 3.5–5.2)
Alkaline Phosphatase: 62 U/L (ref 39–117)
BUN: 9 mg/dL (ref 6–23)
CO2: 29 meq/L (ref 19–32)
Calcium: 9.7 mg/dL (ref 8.4–10.5)
Chloride: 105 meq/L (ref 96–112)
Creatinine, Ser: 0.62 mg/dL (ref 0.40–1.20)
GFR: 90.65 mL/min (ref >60.00)
Glucose, Bld: 117 mg/dL — ABNORMAL HIGH (ref 70–99)
Potassium: 4.1 meq/L (ref 3.5–5.1)
Sodium: 142 meq/L (ref 135–145)
Total Bilirubin: 0.4 mg/dL (ref 0.2–1.2)
Total Protein: 6.8 g/dL (ref 6.0–8.3)

## 2023-12-12 LAB — MICROALBUMIN / CREATININE URINE RATIO
Creatinine,U: 83 mg/dL
Microalb Creat Ratio: 15.4 mg/g (ref 0.0–30.0)
Microalb, Ur: 1.3 mg/dL (ref 0.0–1.9)

## 2023-12-12 LAB — VITAMIN D 25 HYDROXY (VIT D DEFICIENCY, FRACTURES): VITD: 42.99 ng/mL (ref 30.00–100.00)

## 2023-12-12 MED ORDER — CARVEDILOL 6.25 MG PO TABS: 6.2500 mg | ORAL_TABLET | Freq: Two times a day (BID) | ORAL | 1 refills | Status: AC

## 2023-12-12 MED ORDER — FENOFIBRATE 54 MG PO TABS: 54.0000 mg | ORAL_TABLET | Freq: Every day | ORAL | 1 refills | Status: AC

## 2023-12-12 MED ORDER — ROSUVASTATIN CALCIUM 10 MG PO TABS: 10.0000 mg | ORAL_TABLET | Freq: Every day | ORAL | 1 refills | Status: AC

## 2023-12-12 NOTE — Progress Notes (Signed)
 Subjective:    Patient ID: Diane Hodges, female    DOB: 08/21/1955, 68 y.o.   MRN: [id number]  Chief Complaint  Patient presents with   Hypertension   Hyperlipidemia   Diabetes   Hypothyroidism   Follow-up    HPI Patient is in today for f/u dm, thyroid , chol and bp.  Discussed the use of AI scribe software for clinical note transcription with the patient, who gave verbal consent to proceed.  History of Present Illness Diane Hodges is a 68 year old female with a history of breast cancer who presents for a follow-up visit.  She experiences diarrhea, which she attributes to anastrozole , and this has previously led to low vitamin D  levels. She has considered switching medications but found alternatives had similar side effects and were more expensive. She manages the diarrhea with collagen supplements and is considering increasing her fiber intake.  She also experiences hot flashes, which she attributes to anastrozole , and is exploring non-hormonal options to manage these symptoms. She has not had a hysterectomy, only a mastectomy, and remains estrogen positive. She is unclear about the relationship between her estrogen status and the hot flashes she experiences.  She does not currently need any medication refills. She recently picked up her medications, including levothyroxine , Crestor , metformin , and fenofibrate , and is monitoring her medication supply closely.  She is vigilant about her diet, particularly in reducing sugar and carbohydrate intake, and has noticed weight loss since her breast cancer diagnosis. She does not regularly check her blood sugars but feels that her blood sugar levels are not problematic.  She has not yet scheduled an eye doctor appointment but plans to do so. She has not completed a colonoscopy but is interested in using Cologuard, although she is concerned about the sample requirements due to her diarrhea. She is not in favor of receiving the shingles  vaccine.  She reports dry heels but no other issues with her feet.    Past Medical History:  Diagnosis Date   Atypical nevus 04/12/2014   mid back -mild   DM (diabetes mellitus), type 2 (HCC)    Hyperlipemia    Hypertension    PONV (postoperative nausea and vomiting)    with one surgery   Thyroid  disease    Hypothyridism    Past Surgical History:  Procedure Laterality Date   BREAST BIOPSY Left 04/08/2022   MM LT BREAST BX W LOC DEV EA AD LESION IMG BX SPEC STEREO GUIDE 04/08/2022 [facility]   BREAST BIOPSY Left 04/08/2022   MM LT BREAST BX W LOC DEV 1ST LESION IMAGE BX SPEC STEREO GUIDE 04/08/2022 [facility]   BREAST BIOPSY Left 06/21/2022   US  LT RADIOACTIVE SEED LOC 06/21/2022 [facility]   BREAST RECONSTRUCTION WITH PLACEMENT OF TISSUE EXPANDER AND FLEX HD (ACELLULAR HYDRATED DERMIS) Left 06/24/2022   Procedure: IMMEDIATE LEFT BREAST RECONSTRUCTION WITH PLACEMENT OF TISSUE EXPANDER AND FLEX HD (ACELLULAR HYDRATED DERMIS);  Surgeon: Lowery Estefana RAMAN, DO;  Location: [facility];  Service: Plastics;  Laterality: Left;   CATARACT EXTRACTION Right 04/01/2013   MASTECTOMY Left 06/24/2022   MASTECTOMY W/ SENTINEL NODE BIOPSY Left 06/24/2022   Procedure: LEFT MASTECTOMY WITH SENTINEL LYMPH NODE BIOPSY;  Surgeon: Curvin Deward MOULD, MD;  Location: [facility];  Service: General;  Laterality: Left;   OOPHORECTOMY  04/01/1978   RADIOACTIVE SEED GUIDED AXILLARY SENTINEL LYMPH NODE Left 06/24/2022   Procedure: RADIOACTIVE SEED GUIDED LEFT AXILLARY SENTINEL LYMPH NODE DISSECTION;  Surgeon: Curvin Deward MOULD, MD;  Location: [facility];  Service: General;  Laterality: Left;   REMOVAL OF TISSUE EXPANDER AND PLACEMENT OF IMPLANT Left 08/12/2022   Procedure: REMOVAL OF TISSUE EXPANDER AND PLACEMENT OF IMPLANT;  Surgeon: Lowery Estefana RAMAN, DO;  Location: [facility];  Service: Plastics;  Laterality: Left;    Family History  Problem  Relation Age of Onset   Breast cancer Mother    Cancer Mother        breast cancer or precancer   Hypertension Mother    Heart failure Mother    Hypertension Father    Melanoma Father 59   Thyroid  disease Other     Social History   Socioeconomic History   Marital status: Married    Spouse name: Not on file   Number of children: 2   Years of education: Not on file   Highest education level: 12th grade  Occupational History   Occupation: credit reporting    Employer: [employer]    End: 01/19/2019    Comment: retired since 12/2018  Tobacco Use   Smoking status: Never   Smokeless tobacco: Never  Vaping Use   Vaping status: Never Used  Substance and Sexual Activity   Alcohol use: No   Drug use: No   Sexual activity: Yes    Partners: Male    Birth control/protection: Post-menopausal  Other Topics Concern   Not on file  Social History Narrative   Exercise-- walking    Social Drivers of Health   Financial Resource Strain: Low Risk  (12/11/2023)   Overall Financial Resource Strain (CARDIA)    Difficulty of Paying Living Expenses: Not hard at all  Food Insecurity: No Food Insecurity (12/11/2023)   Hunger Vital Sign    Worried About Running Out of Food in the Last Year: Never true    Ran Out of Food in the Last Year: Never true  Transportation Needs: No Transportation Needs (12/11/2023)   PRAPARE - Administrator, Civil Service (Medical): No    Lack of Transportation (Non-Medical): No  Physical Activity: Insufficiently Active (12/11/2023)   Exercise Vital Sign    Days of Exercise per Week: 2 days    Minutes of Exercise per Session: 20 min  Stress: No Stress Concern Present (12/11/2023)   [employer] of Occupational Health - Occupational Stress Questionnaire    Feeling of Stress: Not at all  Social Connections: Socially Integrated (12/11/2023)   Social Connection and Isolation Panel    Frequency of Communication with Friends and Family: More than  three times a week    Frequency of Social Gatherings with Friends and Family: Three times a week    Attends Religious Services: More than 4 times per year    Active Member of Clubs or Organizations: Yes    Attends Banker Meetings: 1 to 4 times per year    Marital Status: Married  Catering manager Violence: Not At Risk (02/13/2022)   Humiliation, Afraid, Rape, and Kick questionnaire    Fear of Current or Ex-Partner: No    Emotionally Abused: No    Physically Abused: No    Sexually Abused: No    Outpatient Medications Prior to Visit  Medication Sig Dispense Refill   anastrozole  (ARIMIDEX ) 1 MG tablet Take 1 tablet (1 mg total) by mouth daily. 90 tablet 1   ascorbic acid (VITAMIN C) 500 MG tablet Take 500 mg by mouth daily.     blood glucose  meter kit and supplies KIT Dispense based on patient and insurance preference. Use up to four times daily as directed. (FOR ICD-9 250.00, 250.01). 1 each 0   Cholecalciferol (VITAMIN D ) 50 MCG (2000 UT) tablet Take 2,000 Units by mouth daily.     glucose blood test strip Onetouch verio test strips. Check blood sugar once a day 100 each 0   levothyroxine  (SYNTHROID ) 112 MCG tablet Take 1 tablet (112 mcg total) by mouth daily before breakfast. Pt needs office visit or labs for further refills 90 tablet 3   metFORMIN  (GLUCOPHAGE ) 500 MG tablet TAKE 1 TABLET BY MOUTH 2 TIMES DAILY WITH A MEAL. 180 tablet 1   Multiple Vitamin (MULTIVITAMIN ADULT) TABS Take 1 tablet by mouth daily.     ONETOUCH DELICA LANCETS FINE MISC 1 Device by Does not apply route daily. 100 each 0   triamcinolone  cream (KENALOG ) 0.1 % Apply 1 Application topically daily as needed. 30 g 4   Vitamin D , Ergocalciferol , (DRISDOL ) 1.25 MG (50000 UNIT) CAPS capsule Take 1 capsule (50,000 Units total) by mouth every 7 (seven) days. 12 capsule 1   carvedilol  (COREG ) 6.25 MG tablet TAKE 1 TABLET BY MOUTH 2 TIMES DAILY WITH A MEAL. 180 tablet 0   fenofibrate  54 MG tablet TAKE 1 TABLET  BY MOUTH DAILY 90 tablet 1   rosuvastatin  (CRESTOR ) 10 MG tablet TAKE 1 TABLET BY MOUTH EVERY DAY 90 tablet 1   No facility-administered medications prior to visit.    Allergies  Allergen Reactions   Other Other (See Comments)    Adhesive Strips causes blisters   Azithromycin Rash   Latex Rash    Review of Systems  Constitutional:  Negative for fever and malaise/fatigue.  HENT:  Negative for congestion.   Eyes:  Negative for blurred vision.  Respiratory:  Negative for cough and shortness of breath.   Cardiovascular:  Negative for chest pain, palpitations and leg swelling.  Gastrointestinal:  Negative for abdominal pain, blood in stool, nausea and vomiting.  Genitourinary:  Negative for dysuria and frequency.  Musculoskeletal:  Negative for back pain and falls.  Skin:  Negative for rash.  Neurological:  Negative for dizziness, loss of consciousness and headaches.  Endo/Heme/Allergies:  Negative for environmental allergies.  Psychiatric/Behavioral:  Negative for depression. The patient is not nervous/anxious.        Objective:    Physical Exam Vitals and nursing note reviewed.  Constitutional:      General: She is not in acute distress.    Appearance: Normal appearance. She is well-developed.  HENT:     Head: Normocephalic and atraumatic.  Eyes:     General: No scleral icterus.       Right eye: No discharge.        Left eye: No discharge.  Cardiovascular:     Rate and Rhythm: Normal rate and regular rhythm.     Heart sounds: No murmur heard. Pulmonary:     Effort: Pulmonary effort is normal. No respiratory distress.     Breath sounds: Normal breath sounds.  Musculoskeletal:        General: Normal range of motion.     Cervical back: Normal range of motion and neck supple.     Right lower leg: No edema.     Left lower leg: No edema.  Skin:    General: Skin is warm and dry.  Neurological:     Mental Status: She is alert and oriented to person, place, and time.  Psychiatric:        Mood and Affect: Mood normal.        Behavior: Behavior normal.        Thought Content: Thought content normal.        Judgment: Judgment normal.     BP 102/68 (BP Location: Right Arm, Patient Position: Sitting, Cuff Size: Large)   Pulse 91   Temp 98.3 F (36.8 C) (Oral)   Resp 18   Ht 5' 5 (1.651 m)   Wt 193 lb (87.5 kg)   SpO2 96%   BMI 32.12 kg/m  Wt Readings from Last 3 Encounters:  12/12/23 193 lb (87.5 kg)  10/27/23 191 lb (86.6 kg)  10/14/23 192 lb 9.6 oz (87.4 kg)    Diabetic Foot Exam - Simple   Simple Foot Form Diabetic Foot exam was performed with the following findings: Yes 12/12/2023 12:57 PM  Visual Inspection No deformities, no ulcerations, no other skin breakdown bilaterally: Yes Sensation Testing Intact to touch and monofilament testing bilaterally: Yes Pulse Check Posterior Tibialis and Dorsalis pulse intact bilaterally: Yes Comments    Lab Results  Component Value Date   WBC 5.0 10/14/2023   HGB 12.9 10/14/2023   HCT 38.4 10/14/2023   PLT 187 10/14/2023   GLUCOSE 115 (H) 07/11/2023   CHOL 114 07/11/2023   TRIG 125.0 07/11/2023   HDL 52.10 07/11/2023   LDLDIRECT 47.0 02/07/2021   LDLCALC 37 07/11/2023   ALT 13 07/11/2023   AST 16 07/11/2023   NA 144 07/11/2023   K 4.4 07/11/2023   CL 106 07/11/2023   CREATININE 0.56 07/11/2023   BUN 11 07/11/2023   CO2 30 07/11/2023   TSH 0.43 07/11/2023   HGBA1C 6.2 07/11/2023   MICROALBUR 0.7 07/11/2023    Lab Results  Component Value Date   TSH 0.43 07/11/2023   Lab Results  Component Value Date   WBC 5.0 10/14/2023   HGB 12.9 10/14/2023   HCT 38.4 10/14/2023   MCV 91.9 10/14/2023   PLT 187 10/14/2023   Lab Results  Component Value Date   NA 144 07/11/2023   K 4.4 07/11/2023   CO2 30 07/11/2023   GLUCOSE 115 (H) 07/11/2023   BUN 11 07/11/2023   CREATININE 0.56 07/11/2023   BILITOT 0.5 07/11/2023   ALKPHOS 57 07/11/2023   AST 16 07/11/2023   ALT 13 07/11/2023    PROT 6.8 07/11/2023   ALBUMIN 4.9 07/11/2023   CALCIUM  9.5 07/11/2023   ANIONGAP 7 04/08/2023   GFR 93.17 07/11/2023   Lab Results  Component Value Date   CHOL 114 07/11/2023   Lab Results  Component Value Date   HDL 52.10 07/11/2023   Lab Results  Component Value Date   LDLCALC 37 07/11/2023   Lab Results  Component Value Date   TRIG 125.0 07/11/2023   Lab Results  Component Value Date   CHOLHDL 2 07/11/2023   Lab Results  Component Value Date   HGBA1C 6.2 07/11/2023       Assessment & Plan:  Type 2 diabetes mellitus with hyperglycemia, without long-term current use of insulin  (HCC) -     Hemoglobin A1c -     Microalbumin / creatinine urine ratio  Essential hypertension Assessment & Plan: Well controlled, no changes to meds. Encouraged heart healthy diet such as the DASH diet and exercise as tolerated.    Orders: -     Carvedilol ; Take 1 tablet (6.25 mg total) by mouth 2 (two) times daily with  a meal.  Dispense: 180 tablet; Refill: 1 -     CBC with Differential/Platelet -     Comprehensive metabolic panel with GFR  Dyslipidemia -     Fenofibrate ; Take 1 tablet (54 mg total) by mouth daily.  Dispense: 90 tablet; Refill: 1 -     Rosuvastatin  Calcium ; Take 1 tablet (10 mg total) by mouth daily.  Dispense: 90 tablet; Refill: 1 -     Lipid panel  Hyperlipidemia associated with type 2 diabetes mellitus (HCC) -     CBC with Differential/Platelet  Vitamin D  deficiency -     VITAMIN D  25 Hydroxy (Vit-D Deficiency, Fractures)  Colon cancer screening -     Cologuard  Uncontrolled type 2 diabetes mellitus with hyperglycemia (HCC) Assessment & Plan: hgba1c to be checked ,  minimize simple carbs. Increase exercise as tolerated. Continue current meds    Hypothyroidism, unspecified type Assessment & Plan: Check labs  Con't synthroid    Hyperlipidemia, unspecified hyperlipidemia type Assessment & Plan: Encourage heart healthy diet such as MIND or DASH diet,  increase exercise, avoid trans fats, simple carbohydrates and processed foods, consider a krill or fish or flaxseed oil cap daily.     Assessment and Plan Assessment & Plan Breast cancer, estrogen receptor positive, post-mastectomy, on anastrozole  with anastrozole -induced diarrhea and menopausal symptoms   Breast cancer is estrogen receptor positive and post-mastectomy, managed with anastrozole . She experiences intermittent, non-severe anastrozole -induced diarrhea and menopausal symptoms, including hot flashes. Alternatives to anastrozole  have similar side effects and are more expensive, so she will continue anastrozole . Consider fiber supplementation and probiotics for diarrhea management. Discuss non-hormonal options for hot flashes with her oncologist and encourage natural remedies.  Type 2 diabetes mellitus   Type 2 diabetes mellitus is managed with metformin . She is vigilant about sugar and carbohydrate intake and has lost weight since her breast cancer diagnosis. She does not regularly check blood sugars but reports no issues with blood sugar control. Continue metformin  and encourage continued dietary vigilance and weight management.  Essential hypertension   Essential hypertension is well-controlled.  Hyperlipidemia   Hyperlipidemia is managed with Crestor  and fenofibrate . She recently refilled fenofibrate  and is due for a Crestor  refill in November. Ensure timely refill of Crestor .  Vitamin D  deficiency   Vitamin D  deficiency was noted previously, possibly exacerbated by anastrozole -induced diarrhea.  General Health Maintenance   General health maintenance includes the need for an eye exam and consideration of the shingles vaccine. She is not in favor of vaccines. Schedule bone density screening in December and a mammogram in January. Contact Cologuard regarding sample requirements due to diarrhea.    Samarth Ogle R Lowne Chase, DO

## 2023-12-12 NOTE — Assessment & Plan Note (Signed)
 Encourage heart healthy diet such as MIND or DASH diet, increase exercise, avoid trans fats, simple carbohydrates and processed foods, consider a krill or fish or flaxseed oil cap daily.

## 2023-12-12 NOTE — Assessment & Plan Note (Signed)
 Well controlled, no changes to meds. Encouraged heart healthy diet such as the DASH diet and exercise as tolerated.

## 2023-12-12 NOTE — Assessment & Plan Note (Signed)
Check labs  Con't synthroid 

## 2023-12-12 NOTE — Assessment & Plan Note (Signed)
 hgba1c to be checked, minimize simple carbs. Increase exercise as tolerated. Continue current meds

## 2023-12-19 ENCOUNTER — Ambulatory Visit: Payer: Self-pay | Admitting: Family Medicine

## 2023-12-30 ENCOUNTER — Other Ambulatory Visit: Payer: Self-pay | Admitting: Family Medicine

## 2023-12-30 DIAGNOSIS — Z1211 Encounter for screening for malignant neoplasm of colon: Secondary | ICD-10-CM | POA: Diagnosis not present

## 2024-01-03 LAB — COLOGUARD: COLOGUARD: NEGATIVE

## 2024-01-12 ENCOUNTER — Other Ambulatory Visit: Payer: Self-pay | Admitting: Family Medicine

## 2024-01-26 ENCOUNTER — Ambulatory Visit: Attending: General Surgery

## 2024-01-26 VITALS — Wt 191.1 lb

## 2024-01-26 DIAGNOSIS — Z483 Aftercare following surgery for neoplasm: Secondary | ICD-10-CM | POA: Insufficient documentation

## 2024-01-26 NOTE — Therapy (Signed)
 OUTPATIENT PHYSICAL THERAPY SOZO SCREENING NOTE   Patient Name: Diane Hodges MRN: [id number] DOB:Dec 24, 1955, 67 y.o., female Today's Date: 01/26/2024  PCP: Antonio Cyndee Jamee JONELLE, DO REFERRING PROVIDER: Curvin Deward MOULD, MD   PT End of Session - 01/26/24 1522     Visit Number 4   # unchanged due to screen only   PT Start Time 1520    PT Stop Time 1524    PT Time Calculation (min) 4 min    Activity Tolerance Patient tolerated treatment well    Behavior During Therapy WFL for tasks assessed/performed          Past Medical History:  Diagnosis Date   Atypical nevus 04/12/2014   mid back -mild   DM (diabetes mellitus), type 2 (HCC)    Hyperlipemia    Hypertension    PONV (postoperative nausea and vomiting)    with one surgery   Thyroid  disease    Hypothyridism   Past Surgical History:  Procedure Laterality Date   BREAST BIOPSY Left 04/08/2022   MM LT BREAST BX W LOC DEV EA AD LESION IMG BX SPEC STEREO GUIDE 04/08/2022 [facility]   BREAST BIOPSY Left 04/08/2022   MM LT BREAST BX W LOC DEV 1ST LESION IMAGE BX SPEC STEREO GUIDE 04/08/2022 [facility]   BREAST BIOPSY Left 06/21/2022   US  LT RADIOACTIVE SEED LOC 06/21/2022 [facility]   BREAST RECONSTRUCTION WITH PLACEMENT OF TISSUE EXPANDER AND FLEX HD (ACELLULAR HYDRATED DERMIS) Left 06/24/2022   Procedure: IMMEDIATE LEFT BREAST RECONSTRUCTION WITH PLACEMENT OF TISSUE EXPANDER AND FLEX HD (ACELLULAR HYDRATED DERMIS);  Surgeon: Lowery Estefana RAMAN, DO;  Location: [facility];  Service: Plastics;  Laterality: Left;   CATARACT EXTRACTION Right 04/01/2013   MASTECTOMY Left 06/24/2022   MASTECTOMY W/ SENTINEL NODE BIOPSY Left 06/24/2022   Procedure: LEFT MASTECTOMY WITH SENTINEL LYMPH NODE BIOPSY;  Surgeon: Curvin Deward MOULD, MD;  Location: [facility];  Service: General;  Laterality: Left;   OOPHORECTOMY  04/01/1978   RADIOACTIVE SEED GUIDED AXILLARY SENTINEL LYMPH NODE Left  06/24/2022   Procedure: RADIOACTIVE SEED GUIDED LEFT AXILLARY SENTINEL LYMPH NODE DISSECTION;  Surgeon: Curvin Deward MOULD, MD;  Location: [facility];  Service: General;  Laterality: Left;   REMOVAL OF TISSUE EXPANDER AND PLACEMENT OF IMPLANT Left 08/12/2022   Procedure: REMOVAL OF TISSUE EXPANDER AND PLACEMENT OF IMPLANT;  Surgeon: Lowery Estefana RAMAN, DO;  Location: [facility];  Service: Plastics;  Laterality: Left;   Patient Active Problem List   Diagnosis Date Noted   Breast cancer (HCC) 06/24/2022   Carcinoma of central portion of left breast in female, estrogen receptor positive (HCC) 04/17/2022   Malignant neoplasm of overlapping sites of left breast in female, estrogen receptor positive (HCC) 04/16/2022   Preventative health care 07/30/2021   Uncontrolled type 2 diabetes mellitus with hyperglycemia (HCC) 06/26/2020   Anxiety 02/20/2013   Obesity (BMI 30-39.9) 02/19/2013   Hyperlipidemia 10/16/2009   Essential hypertension 12/26/2008   ALLERGIC RHINITIS 11/12/2007   COMMON MIGRAINE 12/08/2006   SKIN RASH 12/08/2006   Hypothyroidism 10/20/2006   BRONCHITIS, ACUTE 10/20/2006    REFERRING DIAG: left breast cancer at risk for lymphedema  THERAPY DIAG: Aftercare following surgery for neoplasm  PERTINENT HISTORY: Patient was diagnosed on 03/05/2022 with left grade 1 invasive lobular carcinoma breast cancer. She underwent a left mastectomy and axillary lymph node dissection on 06/24/2022. It is ER/PR positive and HER2 negative with a Ki67 of 2%. 1/4 LN's  PRECAUTIONS: left UE Lymphedema risk  SUBJECTIVE: Pt returns for her 3 month L-Dex screen.   PAIN:  Are you having pain? No  SOZO SCREENING: Patient was assessed today using the SOZO machine to determine the lymphedema index score. This was compared to her baseline score. It was determined that she is within the recommended range when compared to her baseline and no further action is needed at this time. She will continue  SOZO screenings. These are done every 3 months for 2 years post operatively followed by every 6 months for 2 years, and then annually.   L-DEX FLOWSHEETS - 01/26/24 1500       L-DEX LYMPHEDEMA SCREENING   Measurement Type Unilateral    L-DEX MEASUREMENT EXTREMITY Upper Extremity    POSITION  Standing    DOMINANT SIDE Right    At Risk Side Left    BASELINE SCORE (UNILATERAL) -0.1    L-DEX SCORE (UNILATERAL) -1.2    VALUE CHANGE (UNILAT) -1.1         Berwyn Knights, PTA 01/26/24 3:23 PM

## 2024-02-05 ENCOUNTER — Ambulatory Visit

## 2024-02-05 VITALS — Ht 66.0 in | Wt 190.0 lb

## 2024-02-05 DIAGNOSIS — Z780 Asymptomatic menopausal state: Secondary | ICD-10-CM | POA: Diagnosis not present

## 2024-02-05 DIAGNOSIS — Z1231 Encounter for screening mammogram for malignant neoplasm of breast: Secondary | ICD-10-CM

## 2024-02-05 DIAGNOSIS — Z0000 Encounter for general adult medical examination without abnormal findings: Secondary | ICD-10-CM | POA: Diagnosis not present

## 2024-02-05 NOTE — Patient Instructions (Signed)
 Ms. Teagle,  Thank you for taking the time for your Medicare Wellness Visit. I appreciate your continued commitment to your health goals. Please review the care plan we discussed, and feel free to reach out if I can assist you further.  Please note that Annual Wellness Visits do not include a physical exam. Some assessments may be limited, especially if the visit was conducted virtually. If needed, we may recommend an in-person follow-up with your provider.  Ongoing Care Seeing your primary care provider every 3 to 6 months helps us  monitor your health and provide consistent, personalized care.   Referrals If a referral was made during today's visit and you haven't received any updates within two weeks, please contact the referred provider directly to check on the status.  Recommended Screenings:  Health Maintenance  Topic Date Due   COVID-19 Vaccine (1) Never done   Pneumococcal Vaccine for age over 34 (1 of 2 - PCV) Never done   Zoster (Shingles) Vaccine (1 of 2) Never done   Eye exam for diabetics  04/17/2017   Medicare Annual Wellness Visit  02/14/2023   Flu Shot  06/29/2024*   DEXA scan (bone density measurement)  03/05/2024   Breast Cancer Screening  04/13/2024   Hemoglobin A1C  06/10/2024   Yearly kidney function blood test for diabetes  12/11/2024   Yearly kidney health urinalysis for diabetes  12/11/2024   Complete foot exam   12/11/2024   Cologuard (Stool DNA test)  12/30/2026   DTaP/Tdap/Td vaccine (4 - Td or Tdap) 02/29/2032   Hepatitis C Screening  Completed   Meningitis B Vaccine  Aged Out   Colon Cancer Screening  Discontinued  *Topic was postponed. The date shown is not the original due date.       01/29/2024   10:44 AM  Advanced Directives  Does Patient Have a Medical Advance Directive? No  Would patient like information on creating a medical advance directive? No - Patient declined    Vision: Annual vision screenings are recommended for early detection of  glaucoma, cataracts, and diabetic retinopathy. These exams can also reveal signs of chronic conditions such as diabetes and high blood pressure.  Dental: Annual dental screenings help detect early signs of oral cancer, gum disease, and other conditions linked to overall health, including heart disease and diabetes.  Please see the attached documents for additional preventive care recommendations.

## 2024-02-05 NOTE — Progress Notes (Addendum)
 Persons participating in visit: patient  I connected with  Diane Hodges on 02/05/24 by a  audio enabled telemedicine application and verified that I am speaking with the correct person using two identifiers.  Patient Location: Home  Provider Location: Office/Clinic  I discussed the limitations of evaluation and management by telemedicine. The patient expressed understanding and agreed to proceed.  Subjective:   Diane Hodges is a 68 y.o. female who presents for a Medicare Annual Wellness Visit.  Allergies (verified) Other, Azithromycin, and Latex   History: Past Medical History:  Diagnosis Date   Allergy    Seasonal   Atypical nevus 04/12/2014   mid back -mild   Cancer (HCC) 12.24   Cataract    DM (diabetes mellitus), type 2 (HCC)    Hyperlipemia    Hypertension    PONV (postoperative nausea and vomiting)    with one surgery   Thyroid  disease    Hypothyridism   Past Surgical History:  Procedure Laterality Date   BREAST BIOPSY Left 04/08/2022   MM LT BREAST BX W LOC DEV EA AD LESION IMG BX SPEC STEREO GUIDE 04/08/2022 [facility]   BREAST BIOPSY Left 04/08/2022   MM LT BREAST BX W LOC DEV 1ST LESION IMAGE BX SPEC STEREO GUIDE 04/08/2022 [facility]   BREAST BIOPSY Left 06/21/2022   US  LT RADIOACTIVE SEED LOC 06/21/2022 [facility]   BREAST RECONSTRUCTION WITH PLACEMENT OF TISSUE EXPANDER AND FLEX HD (ACELLULAR HYDRATED DERMIS) Left 06/24/2022   Procedure: IMMEDIATE LEFT BREAST RECONSTRUCTION WITH PLACEMENT OF TISSUE EXPANDER AND FLEX HD (ACELLULAR HYDRATED DERMIS);  Surgeon: Lowery Estefana RAMAN, DO;  Location: [facility];  Service: Plastics;  Laterality: Left;   CATARACT EXTRACTION Right 04/01/2013   EYE SURGERY  11/30/2013   Cataract Lens Implant /MD Lamarr Burkitt   MASTECTOMY Left 06/24/2022   MASTECTOMY W/ SENTINEL NODE BIOPSY Left 06/24/2022   Procedure: LEFT MASTECTOMY WITH SENTINEL LYMPH NODE BIOPSY;  Surgeon: Curvin Deward MOULD, MD;  Location: [facility];  Service: General;  Laterality: Left;   OOPHORECTOMY  04/01/1978   RADIOACTIVE SEED GUIDED AXILLARY SENTINEL LYMPH NODE Left 06/24/2022   Procedure: RADIOACTIVE SEED GUIDED LEFT AXILLARY SENTINEL LYMPH NODE DISSECTION;  Surgeon: Curvin Deward MOULD, MD;  Location: [facility];  Service: General;  Laterality: Left;   REMOVAL OF TISSUE EXPANDER AND PLACEMENT OF IMPLANT Left 08/12/2022   Procedure: REMOVAL OF TISSUE EXPANDER AND PLACEMENT OF IMPLANT;  Surgeon: Lowery Estefana RAMAN, DO;  Location: [facility];  Service: Plastics;  Laterality: Left;   Family History  Problem Relation Age of Onset   Breast cancer Mother    Cancer Mother        breast cancer or precancer   Hypertension Mother    Heart failure Mother    Hypertension Father    Melanoma Father 17   Thyroid  disease Other    Social History   Occupational History   Occupation: credit reporting    Employer: [employer]    End: 01/19/2019    Comment: retired since 12/2018  Tobacco Use   Smoking status: Never   Smokeless tobacco: Never  Vaping Use   Vaping status: Never Used  Substance and Sexual Activity   Alcohol use: No   Drug use: No   Sexual activity: Yes    Partners: Male    Birth control/protection: Post-menopausal   Tobacco Counseling Counseling given: Yes  SDOH Screenings   Food Insecurity: No Food Insecurity (01/29/2024)  Housing: Low Risk  (01/29/2024)  Transportation Needs: No Transportation Needs (01/29/2024)  Utilities: Not At Risk (02/05/2024)  Alcohol Screen: Low Risk  (02/08/2021)  Depression (PHQ2-9): Low Risk  (02/05/2024)  Financial Resource Strain: Low Risk  (01/29/2024)  Physical Activity: Insufficiently Active (01/29/2024)  Social Connections: Socially Integrated (01/29/2024)  Stress: No Stress Concern Present (01/29/2024)  Tobacco Use: Low Risk  (02/05/2024)  Health Literacy: Adequate Health Literacy (02/05/2024)   Depression  Screen    02/05/2024   11:33 AM 10/14/2023   10:52 AM 02/13/2022   11:03 AM 07/30/2021    9:47 AM 02/08/2021   11:19 AM 10/21/2019   10:29 AM 02/23/2018   11:21 AM  PHQ 2/9 Scores  PHQ - 2 Score 0 0 0 0 0 0 0  PHQ- 9 Score       2      Data saved with a previous flowsheet row definition     Goals Addressed               This Visit's Progress     To be as healthy as I can be in 2026 (pt-stated)         Visit info / Clinical Intake: Medicare Wellness Visit Type:: Subsequent Annual Wellness Visit Medicare Wellness Visit Mode:: Telephone If telephone or video:: pt reported vitals Interpreter Needed?: No Pre-visit prep was completed: yes AWV questionnaire completed by patient prior to visit?: yes Date:: 01/29/24 Living arrangements:: lives with spouse/significant other Typical amount of pain: none Does pain affect daily life?: no Are you currently prescribed opioids?: no  Dietary Habits and Nutritional Risks How many meals a day?: 3 Eats fruit and vegetables daily?: yes Most meals are obtained by: preparing own meals Diabetic:: (!) yes Any non-healing wounds?: no How often do you check your BS?: 0 Would you like to be referred to a Nutritionist or for Diabetic Management? : no  Functional Status Activities of Daily Living (to include ambulation/medication): (Patient-Rptd) Independent Ambulation: (Patient-Rptd) Independent Medication Administration: Independent Home Management: (Patient-Rptd) Independent Manage your own finances?: yes Primary transportation is: driving Concerns about vision?: no *vision screening is required for WTM* Concerns about hearing?: no  Fall Screening Falls in the past year?: (Patient-Rptd) 0 Number of falls in past year: (Patient-Rptd) 0 Was there an injury with Fall?: (Patient-Rptd) 0 Fall Risk Category Calculator: (Patient-Rptd) 0 Patient Fall Risk Level: (Patient-Rptd) Low Fall Risk  Fall Risk Patient at Risk for Falls Due to: No  Fall Risks Fall risk Follow up: Falls evaluation completed; Education provided; Falls prevention discussed  Home and Transportation Safety: All rugs have non-skid backing?: yes All stairs or steps have railings?: yes Grab bars in the bathtub or shower?: yes Have non-skid surface in bathtub or shower?: (!) no Good home lighting?: yes Regular seat belt use?: yes Hospital stays in the last year:: no  Cognitive Assessment Difficulty concentrating, remembering, or making decisions? : no Will 6CIT or Mini Cog be Completed: no 6CIT or Mini Cog Declined: patient alert, oriented, able to answer questions appropriately and recall recent events  Advance Directives (For Healthcare) Does Patient Have a Medical Advance Directive?: No Would patient like information on creating a medical advance directive?: No - Patient declined  Reviewed/Updated  Reviewed/Updated: All        Objective:    Today's Vitals   02/05/24 1124  Weight: 190 lb (86.2 kg)  Height: 5' 6 (1.676 m)   Body mass index is 30.67 kg/m.  Current Medications (verified) Outpatient Encounter Medications  as of 02/05/2024  Medication Sig   anastrozole  (ARIMIDEX ) 1 MG tablet Take 1 tablet (1 mg total) by mouth daily.   ascorbic acid (VITAMIN C) 500 MG tablet Take 500 mg by mouth daily.   blood glucose meter kit and supplies KIT Dispense based on patient and insurance preference. Use up to four times daily as directed. (FOR ICD-9 250.00, 250.01).   carvedilol  (COREG ) 6.25 MG tablet Take 1 tablet (6.25 mg total) by mouth 2 (two) times daily with a meal.   Cholecalciferol (VITAMIN D ) 50 MCG (2000 UT) tablet Take 2,000 Units by mouth daily.   fenofibrate  54 MG tablet Take 1 tablet (54 mg total) by mouth daily.   glucose blood test strip Onetouch verio test strips. Check blood sugar once a day   levothyroxine  (SYNTHROID ) 112 MCG tablet Take 1 tablet (112 mcg total) by mouth daily before breakfast. Pt needs office visit or labs for  further refills   metFORMIN  (GLUCOPHAGE ) 500 MG tablet TAKE 1 TABLET BY MOUTH TWICE A DAY WITH FOOD   Multiple Vitamin (MULTIVITAMIN ADULT) TABS Take 1 tablet by mouth daily.   ONETOUCH DELICA LANCETS FINE MISC 1 Device by Does not apply route daily.   rosuvastatin  (CRESTOR ) 10 MG tablet Take 1 tablet (10 mg total) by mouth daily.   triamcinolone  cream (KENALOG ) 0.1 % Apply 1 Application topically daily as needed.   Vitamin D , Ergocalciferol , (DRISDOL ) 1.25 MG (50000 UNIT) CAPS capsule TAKE 1 CAPSULE (50,000 UNITS TOTAL) BY MOUTH EVERY 7 (SEVEN) DAYS   No facility-administered encounter medications on file as of 02/05/2024.   Hearing/Vision screen Hearing Screening - Comments:: Patient denies any hearing difficulties.   Vision Screening - Comments:: Wears rx glasses - up to date with routine eye exams with  [facility] Immunizations and Health Maintenance Health Maintenance  Topic Date Due   COVID-19 Vaccine (1) Never done   Pneumococcal Vaccine: 50+ Years (1 of 2 - PCV) Never done   Zoster Vaccines- Shingrix (1 of 2) Never done   OPHTHALMOLOGY EXAM  04/17/2017   Medicare Annual Wellness (AWV)  02/14/2023   Influenza Vaccine  06/29/2024 (Originally 10/31/2023)   DEXA SCAN  03/05/2024   Mammogram  04/13/2024   HEMOGLOBIN A1C  06/10/2024   Diabetic kidney evaluation - eGFR measurement  12/11/2024   Diabetic kidney evaluation - Urine ACR  12/11/2024   FOOT EXAM  12/11/2024   Fecal DNA (Cologuard)  12/30/2026   DTaP/Tdap/Td (4 - Td or Tdap) 02/29/2032   Hepatitis C Screening  Completed   Meningococcal B Vaccine  Aged Out   Colonoscopy  Discontinued        Assessment/Plan:  This is a routine wellness examination for [facility]  Patient Care Team: Antonio Meth, Jamee SAUNDERS, DO as PCP - General Heide Ingle, MD as Consulting Physician (Orthopedic Surgery) Tyree Nanetta SAILOR, RN as Oncology Nurse Navigator Lanny Callander, MD as Consulting Physician  (Hematology) Curvin Deward MOULD, MD as Consulting Physician (General Surgery) Burton, Lacie K, NP as Nurse Practitioner (Nurse Practitioner)  I have personally reviewed and noted the following in the patient's chart:   Medical and social history Use of alcohol, tobacco or illicit drugs  Current medications and supplements including opioid prescriptions. Functional ability and status Nutritional status Physical activity Advanced directives List of other physicians Hospitalizations, surgeries, and ER visits in previous 12 months Vitals Screenings to include cognitive, depression, and falls Referrals and appointments  No orders of the defined types were  placed in this encounter.  In addition, I have reviewed and discussed with patient certain preventive protocols, quality metrics, and best practice recommendations. A written personalized care plan for preventive services as well as general preventive health recommendations were provided to patient.   Keyairra Kolinski, CMA   02/05/2024   No follow-ups on file.  After Visit Summary: (MyChart) Due to this being a telephonic visit, the after visit summary with patients personalized plan was offered to patient via MyChart   Nurse Notes: mammogram and bone density ordered. Patient provided with the phone number to call and schedule.

## 2024-03-30 NOTE — Progress Notes (Signed)
 Diane Hodges                                          MRN: [id number]   03/30/2024   The [facility] Quality Team Specialist reviewed this patient medical record for the purposes of chart review for care gap closure. The following were reviewed: chart review for care gap closure-diabetic eye exam.    [facility] Quality Team

## 2024-04-04 ENCOUNTER — Other Ambulatory Visit: Payer: Self-pay | Admitting: Nurse Practitioner

## 2024-04-04 DIAGNOSIS — C50812 Malignant neoplasm of overlapping sites of left female breast: Secondary | ICD-10-CM

## 2024-04-04 NOTE — Assessment & Plan Note (Addendum)
 invasive lobular carcinoma, cT3N1M0, stage IIA, ER+/PR+/HER2-, G1  -diagnosed in 04/2022 -Given the lobular histology, she underwent breast MRI, which showed 6.1 cm mass in the central of left breast, with 1 abnormal axillary lymph node. -Staging bone scan showed abnormal uptake in L2 and L3, we obtained spinal MRI to evaluate the bone metastasis which was negative. -CT chest abdomen pelvis with contrast was negative for metastatic disease. -She underwent left breast mastectomy and tolerated lymph node biopsy in March 2024.  I reviewed her surgical pathology findings with her in detail -Her Oncotype showed recurrence score 2, predicts her risk of distant recurrence in the next 90 days 9%, no adjuvant chemotherapy is recommended.  -She completed adjuvant radiation -She started adjuvant anastrozole  in August 2024, tolerating well overall  - Trial of exemestane  after visit in July 2025 plan she went back to anastrozole  due to concern of worsening side effects.  Does have mild joint pain, diarrhea, and hot flashes due to anastrozole .  All side effects are becoming more manageable. - Screening mammogram and DEXA scan both scheduled for February 2026.

## 2024-04-04 NOTE — Progress Notes (Signed)
 " Patient Care Team: Antonio Meth, Jamee SAUNDERS, DO as PCP - General Heide Ingle, MD as Consulting Physician (Orthopedic Surgery) Tyree Nanetta SAILOR, RN as Oncology Nurse Navigator Lanny Callander, MD as Consulting Physician (Hematology) Curvin Deward MOULD, MD as Consulting Physician (General Surgery) Burton, Lacie K, NP as Nurse Practitioner (Nurse Practitioner)  Clinic Day:  04/05/2024  Referring physician: Antonio Meth, Jamee SAUNDERS, DO  ASSESSMENT & PLAN:   Assessment & Plan: Malignant neoplasm of overlapping sites of left breast in female, estrogen receptor positive (HCC) invasive lobular carcinoma, cT3N1M0, stage IIA, ER+/PR+/HER2-, G1  -diagnosed in 04/2022 -Given the lobular histology, she underwent breast MRI, which showed 6.1 cm mass in the central of left breast, with 1 abnormal axillary lymph node. -Staging bone scan showed abnormal uptake in L2 and L3, we obtained spinal MRI to evaluate the bone metastasis which was negative. -CT chest abdomen pelvis with contrast was negative for metastatic disease. -She underwent left breast mastectomy and tolerated lymph node biopsy in March 2024.  I reviewed her surgical pathology findings with her in detail -Her Oncotype showed recurrence score 2, predicts her risk of distant recurrence in the next 90 days 9%, no adjuvant chemotherapy is recommended.  -She completed adjuvant radiation -She started adjuvant anastrozole  in August 2024, tolerating well overall  - Trial of exemestane  after visit in July 2025 plan she went back to anastrozole  due to concern of worsening side effects.  Does have mild joint pain, diarrhea, and hot flashes due to anastrozole .  All side effects are becoming more manageable. - Screening mammogram and DEXA scan both scheduled for February 2026.   Hot flashes due to anastrozole  Patient did trial of exemestane  for several weeks after her last visit.  Became concerned about worsening side effects on exemestane  and went back to  anastrozole .  She reports that some hot flashes continue.  She is managing these much better.  We discussed nonmedicinal treatments for hot flashes but she does not want to add additional medications.   Joint pain due to anastrozole  Joint pain, particularly in fingers, likely due to anastrozole . Reports stiffness and difficulty bending fingers, which improves with collagen supplementation. Joint pain is a common side effect of anastrozole  and may persist even after discontinuation.  -Trial of exemestane  after her most recent visit.  Stop taking this and went back to anastrozole  after a few weeks.  Was more concerned about negative side effects associated with the medication.  She states that her joint pain is manageable and improved since summer.  And would like to continue on anastrozole  daily. - Encourage continuation of collagen supplementation. - Recommend exercise, particularly strength training and core exercises, to help alleviate joint pain.   Diarrhea possibly due to anastrozole  Diarrhea occurring multiple times daily, likely related to anastrozole . Diarrhea is not a common side effect of anastrozole , but symptoms began after starting the medication.  -She did trial of exemestane , but is back on anastrozole  every day due to concerns of worsening side effects on exemestane . -She reports improved diarrhea.  Will use Imodium for symptoms when indicated.  Plan Labs reviewed. - Unremarkable CBC and CMP. 3D screening mammogram scheduled for February 2026. DEXA scan scheduled for February 2026. Continue anastrozole  daily. Labs and follow-up in 6 months, sooner if needed.  The patient understands the plans discussed today and is in agreement with them.  She knows to contact our office if she develops concerns prior to her next appointment.  I provided 25 minutes of face-to-face time during  this encounter and > 50% was spent counseling as documented under my assessment and plan.    Powell FORBES Lessen, NP  [facility] - A DEPT OF [facility] [address] [place] [address] Dept: [phone] Dept Fax: [phone]   No orders of the defined types were placed in this encounter.     CHIEF COMPLAINT:  CC: Left breast cancer, ER +  Current Treatment: Adjuvant anastrozole  1 mg daily  INTERVAL HISTORY:  Winefred is here today for repeat clinical assessment.  She last saw Dr. Lanny on 10/14/2023.  She is scheduled for both 3D screening mammogram and bone density in February 2026.  She continues to tolerate anastrozole  well.  Has some hot flashes however they are improving.  She does have some joint pain and stiffness in her fingers.  She has started collagen for this.  She does have history of osteoarthritis.  She denies any changes or masses in either breast.  She denies chest pain, chest pressure, or shortness of breath. She denies headaches or visual disturbances. She denies abdominal pain, nausea, vomiting, or changes in bowel or bladder habits.  She denies fevers or chills. She denies pain. Her appetite is good. Her weight has increased 4 pounds over last 3 monhts.  I have reviewed the past medical history, past surgical history, social history and family history with the patient and they are unchanged from previous note.  ALLERGIES:  is allergic to other, azithromycin, and latex.  MEDICATIONS:  Current Outpatient Medications  Medication Sig Dispense Refill   anastrozole  (ARIMIDEX ) 1 MG tablet Take 1 tablet (1 mg total) by mouth daily. 90 tablet 1   ascorbic acid (VITAMIN C) 500 MG tablet Take 500 mg by mouth daily.     blood glucose meter kit and supplies KIT Dispense based on patient and insurance preference. Use up to four times daily as directed. (FOR ICD-9 250.00, 250.01). 1 each 0   carvedilol  (COREG ) 6.25 MG tablet Take 1 tablet (6.25 mg total) by mouth 2 (two) times daily with a meal. 180 tablet 1    Cholecalciferol (VITAMIN D ) 50 MCG (2000 UT) tablet Take 2,000 Units by mouth daily.     fenofibrate  54 MG tablet Take 1 tablet (54 mg total) by mouth daily. 90 tablet 1   glucose blood test strip Onetouch verio test strips. Check blood sugar once a day 100 each 0   levothyroxine  (SYNTHROID ) 112 MCG tablet Take 1 tablet (112 mcg total) by mouth daily before breakfast. Pt needs office visit or labs for further refills 90 tablet 3   metFORMIN  (GLUCOPHAGE ) 500 MG tablet TAKE 1 TABLET BY MOUTH TWICE A DAY WITH FOOD 180 tablet 1   Multiple Vitamin (MULTIVITAMIN ADULT) TABS Take 1 tablet by mouth daily.     ONETOUCH DELICA LANCETS FINE MISC 1 Device by Does not apply route daily. 100 each 0   rosuvastatin  (CRESTOR ) 10 MG tablet Take 1 tablet (10 mg total) by mouth daily. 90 tablet 1   triamcinolone  cream (KENALOG ) 0.1 % Apply 1 Application topically daily as needed. 30 g 4   Vitamin D , Ergocalciferol , (DRISDOL ) 1.25 MG (50000 UNIT) CAPS capsule TAKE 1 CAPSULE (50,000 UNITS TOTAL) BY MOUTH EVERY 7 (SEVEN) DAYS 12 capsule 1   No current facility-administered medications for this visit.    HISTORY OF PRESENT ILLNESS:   Oncology History Overview Note   Cancer Staging  Malignant neoplasm of  overlapping sites of left breast in female, estrogen receptor positive (HCC) Staging form: Breast, AJCC 8th Edition - Clinical stage from 04/08/2022: Stage Unknown (cTX, cN0, cM0, G1, ER+, PR+, HER2-) - Signed by Lanny Callander, MD on 04/16/2022 Stage prefix: Initial diagnosis Histologic grading system: 3 grade system     Malignant neoplasm of overlapping sites of left breast in female, estrogen receptor positive (HCC)  03/20/2022 Imaging    IMPRESSION: 1. Suspicious architectural distortion within the central/retroareolar LEFT breast, best seen on spot compression CC slice 29, without sonographic correlate. Stereotactic biopsy is recommended. 2. Additional possible subtle architectural distortion within the outer  LEFT breast, best seen on spot compression CC slice 46, without sonographic correlate. Stereotactic biopsy is recommended.     04/08/2022 Procedure    FINDINGS: 3D Mammographic images were obtained following stereotactic core biopsies of left breast distortion. There is a ribbon and coil shaped clip in the upper aspect of the breast in appropriate position.     04/16/2022 Initial Diagnosis   Malignant neoplasm of overlapping sites of left breast in female, estrogen receptor positive (HCC)   06/24/2022 Cancer Staging   Staging form: Breast, AJCC 8th Edition - Pathologic stage from 06/24/2022: Stage IB (pT3, pN1a, cM0, G2, ER+, PR+, HER2-) - Signed by Lanny Callander, MD on 10/17/2022 Histologic grading system: 3 grade system Residual tumor (R): R0 - None       REVIEW OF SYSTEMS:   Constitutional: Denies fevers, chills or abnormal weight loss Eyes: Denies blurriness of vision Ears, nose, mouth, throat, and face: Denies mucositis or sore throat Respiratory: Denies cough, dyspnea or wheezes Cardiovascular: Denies palpitation, chest discomfort or lower extremity swelling Gastrointestinal:  Denies nausea, heartburn or change in bowel habits Skin: Denies abnormal skin rashes Lymphatics: Denies new lymphadenopathy or easy bruising Neurological:Denies numbness, tingling or new weaknesses Behavioral/Psych: Mood is stable, no new changes  All other systems were reviewed with the patient and are negative.   VITALS:   Today's Vitals   04/05/24 1120 04/05/24 1122  BP: 138/78   Pulse: 95   Resp: 17   Temp: 97.8 F (36.6 C)   SpO2: 96%   Weight: 194 lb 1.6 oz (88 kg)   PainSc:  0-No pain   Body mass index is 31.33 kg/m.    Wt Readings from Last 3 Encounters:  04/12/24 192 lb 9.6 oz (87.4 kg)  04/05/24 194 lb 1.6 oz (88 kg)  02/05/24 190 lb (86.2 kg)    Body mass index is 31.33 kg/m.  Performance status (ECOG): 1 - Symptomatic but completely ambulatory  PHYSICAL EXAM:    GENERAL:alert, no distress and comfortable SKIN: skin color, texture, turgor are normal, no rashes or significant lesions EYES: normal, Conjunctiva are pink and non-injected, sclera clear OROPHARYNX:no exudate, no erythema and lips, buccal mucosa, and tongue normal  NECK: supple, thyroid  normal size, non-tender, without nodularity LYMPH:  no palpable lymphadenopathy in the cervical, axillary or inguinal LUNGS: clear to auscultation and percussion with normal breathing effort HEART: regular rate & rhythm and no murmurs and no lower extremity edema ABDOMEN:abdomen soft, non-tender and normal bowel sounds Musculoskeletal:no cyanosis of digits and no clubbing  NEURO: alert & oriented x 3 with fluent speech, no focal motor/sensory deficits BREAST: There is well-healed lumpectomy scar on the left breast.  There is no palpable nodes or masses noted.  There is no nipple inversion or nipple discharge.  There is no axillary lymphadenopathy on the left.  There are no palpable masses or lumps  in the right breast.  There is no nipple inversion or nipple discharge.  There is no axillary adenopathy on the right.  LABORATORY DATA:  I have reviewed the data as listed    Component Value Date/Time   NA 140 04/12/2024 1148   K 3.9 04/12/2024 1148   CL 104 04/12/2024 1148   CO2 26 04/12/2024 1148   GLUCOSE 114 (H) 04/12/2024 1148   BUN 9 04/12/2024 1148   CREATININE 0.59 04/12/2024 1148   CREATININE 0.71 04/05/2024 1101   CREATININE 0.69 01/27/2020 1013   CALCIUM  9.6 04/12/2024 1148   PROT 7.2 04/12/2024 1148   ALBUMIN 4.7 04/12/2024 1148   AST 15 04/12/2024 1148   AST 22 04/05/2024 1101   ALT 14 04/12/2024 1148   ALT 18 04/05/2024 1101   ALKPHOS 65 04/12/2024 1148   BILITOT 0.5 04/12/2024 1148   BILITOT 0.5 04/05/2024 1101   GFRNONAA >60 04/05/2024 1101   GFRAA 97 12/08/2006 1058     Lab Results  Component Value Date   WBC 5.0 04/12/2024   NEUTROABS 2.7 04/12/2024   HGB 13.2 04/12/2024    HCT 39.2 04/12/2024   MCV 91.9 04/12/2024   PLT 193.0 04/12/2024    "

## 2024-04-05 ENCOUNTER — Inpatient Hospital Stay (HOSPITAL_BASED_OUTPATIENT_CLINIC_OR_DEPARTMENT_OTHER): Admitting: Nurse Practitioner

## 2024-04-05 ENCOUNTER — Inpatient Hospital Stay: Attending: Nurse Practitioner

## 2024-04-05 VITALS — BP 138/78 | HR 95 | Temp 97.8°F | Resp 17 | Wt 194.1 lb

## 2024-04-05 DIAGNOSIS — C50812 Malignant neoplasm of overlapping sites of left female breast: Secondary | ICD-10-CM | POA: Diagnosis not present

## 2024-04-05 DIAGNOSIS — Z170 Estrogen receptor positive status [ER+]: Secondary | ICD-10-CM

## 2024-04-05 LAB — CBC WITH DIFFERENTIAL (CANCER CENTER ONLY)
Abs Immature Granulocytes: 0.01 K/uL (ref 0.00–0.07)
Basophils Absolute: 0 K/uL (ref 0.0–0.1)
Basophils Relative: 1 %
Eosinophils Absolute: 0.1 K/uL (ref 0.0–0.5)
Eosinophils Relative: 3 %
HCT: 39.7 % (ref 36.0–46.0)
Hemoglobin: 13.3 g/dL (ref 12.0–15.0)
Immature Granulocytes: 0 %
Lymphocytes Relative: 33 %
Lymphs Abs: 1.4 K/uL (ref 0.7–4.0)
MCH: 30.8 pg (ref 26.0–34.0)
MCHC: 33.5 g/dL (ref 30.0–36.0)
MCV: 91.9 fL (ref 80.0–100.0)
Monocytes Absolute: 0.4 K/uL (ref 0.1–1.0)
Monocytes Relative: 9 %
Neutro Abs: 2.3 K/uL (ref 1.7–7.7)
Neutrophils Relative %: 54 %
Platelet Count: 189 K/uL (ref 150–400)
RBC: 4.32 MIL/uL (ref 3.87–5.11)
RDW: 12.5 % (ref 11.5–15.5)
WBC Count: 4.2 K/uL (ref 4.0–10.5)
nRBC: 0 % (ref 0.0–0.2)

## 2024-04-05 LAB — CMP (CANCER CENTER ONLY)
ALT: 18 U/L (ref 0–44)
AST: 22 U/L (ref 15–41)
Albumin: 4.9 g/dL (ref 3.5–5.0)
Alkaline Phosphatase: 78 U/L (ref 38–126)
Anion gap: 12 (ref 5–15)
BUN: 8 mg/dL (ref 8–23)
CO2: 27 mmol/L (ref 22–32)
Calcium: 10 mg/dL (ref 8.9–10.3)
Chloride: 103 mmol/L (ref 98–111)
Creatinine: 0.71 mg/dL (ref 0.44–1.00)
GFR, Estimated: >60 mL/min
Glucose, Bld: 130 mg/dL — ABNORMAL HIGH (ref 70–99)
Potassium: 4 mmol/L (ref 3.5–5.1)
Sodium: 142 mmol/L (ref 135–145)
Total Bilirubin: 0.5 mg/dL (ref 0.0–1.2)
Total Protein: 7.5 g/dL (ref 6.5–8.1)

## 2024-04-12 ENCOUNTER — Ambulatory Visit: Admitting: Family Medicine

## 2024-04-12 ENCOUNTER — Encounter: Payer: Self-pay | Admitting: Family Medicine

## 2024-04-12 VITALS — BP 118/78 | HR 81 | Temp 97.9°F | Resp 18 | Ht 66.0 in | Wt 192.6 lb

## 2024-04-12 DIAGNOSIS — I10 Essential (primary) hypertension: Secondary | ICD-10-CM | POA: Diagnosis not present

## 2024-04-12 DIAGNOSIS — E559 Vitamin D deficiency, unspecified: Secondary | ICD-10-CM | POA: Diagnosis not present

## 2024-04-12 DIAGNOSIS — E039 Hypothyroidism, unspecified: Secondary | ICD-10-CM

## 2024-04-12 DIAGNOSIS — C50812 Malignant neoplasm of overlapping sites of left female breast: Secondary | ICD-10-CM | POA: Diagnosis not present

## 2024-04-12 DIAGNOSIS — Z170 Estrogen receptor positive status [ER+]: Secondary | ICD-10-CM

## 2024-04-12 DIAGNOSIS — E785 Hyperlipidemia, unspecified: Secondary | ICD-10-CM

## 2024-04-12 DIAGNOSIS — Z7984 Long term (current) use of oral hypoglycemic drugs: Secondary | ICD-10-CM | POA: Diagnosis not present

## 2024-04-12 DIAGNOSIS — E1165 Type 2 diabetes mellitus with hyperglycemia: Secondary | ICD-10-CM | POA: Diagnosis not present

## 2024-04-12 LAB — CBC WITH DIFFERENTIAL/PLATELET
Basophils Absolute: 0 K/uL (ref 0.0–0.1)
Basophils Relative: 0.9 % (ref 0.0–3.0)
Eosinophils Absolute: 0.1 K/uL (ref 0.0–0.7)
Eosinophils Relative: 2.8 % (ref 0.0–5.0)
HCT: 39.2 % (ref 36.0–46.0)
Hemoglobin: 13.2 g/dL (ref 12.0–15.0)
Lymphocytes Relative: 34.2 % (ref 12.0–46.0)
Lymphs Abs: 1.7 K/uL (ref 0.7–4.0)
MCHC: 33.6 g/dL (ref 30.0–36.0)
MCV: 91.9 fl (ref 78.0–100.0)
Monocytes Absolute: 0.4 K/uL (ref 0.1–1.0)
Monocytes Relative: 7.6 % (ref 3.0–12.0)
Neutro Abs: 2.7 K/uL (ref 1.4–7.7)
Neutrophils Relative %: 54.5 % (ref 43.0–77.0)
Platelets: 193 K/uL (ref 150.0–400.0)
RBC: 4.27 Mil/uL (ref 3.87–5.11)
RDW: 13 % (ref 11.5–15.5)
WBC: 5 K/uL (ref 4.0–10.5)

## 2024-04-12 LAB — COMPREHENSIVE METABOLIC PANEL WITH GFR
ALT: 14 U/L (ref 3–35)
AST: 15 U/L (ref 5–37)
Albumin: 4.7 g/dL (ref 3.5–5.2)
Alkaline Phosphatase: 65 U/L (ref 39–117)
BUN: 9 mg/dL (ref 6–23)
CO2: 26 meq/L (ref 19–32)
Calcium: 9.6 mg/dL (ref 8.4–10.5)
Chloride: 104 meq/L (ref 96–112)
Creatinine, Ser: 0.59 mg/dL (ref 0.40–1.20)
GFR: 91.52 mL/min
Glucose, Bld: 114 mg/dL — ABNORMAL HIGH (ref 70–99)
Potassium: 3.9 meq/L (ref 3.5–5.1)
Sodium: 140 meq/L (ref 135–145)
Total Bilirubin: 0.5 mg/dL (ref 0.2–1.2)
Total Protein: 7.2 g/dL (ref 6.0–8.3)

## 2024-04-12 LAB — TSH: TSH: 0.13 u[IU]/mL — ABNORMAL LOW (ref 0.35–5.50)

## 2024-04-12 LAB — LIPID PANEL
Cholesterol: 98 mg/dL (ref 28–200)
HDL: 52.3 mg/dL
LDL Cholesterol: 17 mg/dL (ref 10–99)
NonHDL: 45.88
Total CHOL/HDL Ratio: 2
Triglycerides: 145 mg/dL (ref 10.0–149.0)
VLDL: 29 mg/dL (ref 0.0–40.0)

## 2024-04-12 LAB — HEMOGLOBIN A1C: Hgb A1c MFr Bld: 6.4 % (ref 4.6–6.5)

## 2024-04-12 LAB — VITAMIN D 25 HYDROXY (VIT D DEFICIENCY, FRACTURES): VITD: 34.8 ng/mL (ref 30.00–100.00)

## 2024-04-12 NOTE — Assessment & Plan Note (Signed)
 Well controlled, no changes to meds. Encouraged heart healthy diet such as the DASH diet and exercise as tolerated.

## 2024-04-12 NOTE — Assessment & Plan Note (Signed)
 Per oncology

## 2024-04-12 NOTE — Assessment & Plan Note (Signed)
Cont synthroid Check labs  

## 2024-04-12 NOTE — Assessment & Plan Note (Signed)
 Encourage heart healthy diet such as MIND or DASH diet, increase exercise, avoid trans fats, simple carbohydrates and processed foods, consider a krill or fish or flaxseed oil cap daily.

## 2024-04-12 NOTE — Progress Notes (Signed)
 "  Subjective:    Patient ID: Diane Hodges, female    DOB: May 04, 1955, 69 y.o.   MRN: [id number]  Chief Complaint  Patient presents with   Diabetes   Hypertension   Hyperlipidemia   Hypothyroidism    HPI Patient is in today for f/u.  Discussed the use of AI scribe software for clinical note transcription with the patient, who gave verbal consent to proceed.  History of Present Illness Diane Hodges is a 70 year old female who presents for a routine follow-up visit.  She does not regularly monitor her blood glucose levels, preferring to rely on her physical sensations. She has explored non-invasive glucose monitoring options but has not found a suitable one. She is currently on metformin , which initially caused diarrhea, but she manages this side effect with collagen supplements.  She experiences ankle swelling, particularly after prolonged sitting without elevating her feet, which she attributes to recent inactivity during the holidays.  She is taking Arimidex  as prescribed by her oncologist, along with vitamin D , vitamin E, and a multivitamin. She inquires about the necessity of taking vitamin C and fish oil and mentions her use of collagen supplements.  She has not had a recent eye examination.  She recently changed her Medicare provider to [facility], appreciating benefits such as a grocery allowance and over-the-counter benefits. Her husband remained with [employer] for better coverage of his heart condition.  -    Past Medical History:  Diagnosis Date   Allergy    Seasonal   Atypical nevus 04/12/2014   mid back -mild   Cancer (HCC) 12.24   Cataract    DM (diabetes mellitus), type 2 (HCC)    Hyperlipemia    Hypertension    PONV (postoperative nausea and vomiting)    with one surgery   Thyroid  disease    Hypothyridism    Past Surgical History:  Procedure Laterality Date   BREAST BIOPSY Left 04/08/2022   MM LT BREAST BX W LOC DEV EA AD LESION  IMG BX SPEC STEREO GUIDE 04/08/2022 [facility]   BREAST BIOPSY Left 04/08/2022   MM LT BREAST BX W LOC DEV 1ST LESION IMAGE BX SPEC STEREO GUIDE 04/08/2022 [facility]   BREAST BIOPSY Left 06/21/2022   US  LT RADIOACTIVE SEED LOC 06/21/2022 [facility]   BREAST RECONSTRUCTION WITH PLACEMENT OF TISSUE EXPANDER AND FLEX HD (ACELLULAR HYDRATED DERMIS) Left 06/24/2022   Procedure: IMMEDIATE LEFT BREAST RECONSTRUCTION WITH PLACEMENT OF TISSUE EXPANDER AND FLEX HD (ACELLULAR HYDRATED DERMIS);  Surgeon: Lowery Estefana RAMAN, DO;  Location: [facility];  Service: Plastics;  Laterality: Left;   CATARACT EXTRACTION Right 04/01/2013   EYE SURGERY  11/30/2013   Cataract Lens Implant /MD Lamarr Burkitt   MASTECTOMY Left 06/24/2022   MASTECTOMY W/ SENTINEL NODE BIOPSY Left 06/24/2022   Procedure: LEFT MASTECTOMY WITH SENTINEL LYMPH NODE BIOPSY;  Surgeon: Curvin Deward MOULD, MD;  Location: [facility];  Service: General;  Laterality: Left;   OOPHORECTOMY  04/01/1978   RADIOACTIVE SEED GUIDED AXILLARY SENTINEL LYMPH NODE Left 06/24/2022   Procedure: RADIOACTIVE SEED GUIDED LEFT AXILLARY SENTINEL LYMPH NODE DISSECTION;  Surgeon: Curvin Deward MOULD, MD;  Location: [facility];  Service: General;  Laterality: Left;   REMOVAL OF TISSUE EXPANDER AND PLACEMENT OF IMPLANT Left 08/12/2022   Procedure: REMOVAL OF TISSUE EXPANDER AND PLACEMENT OF IMPLANT;  Surgeon: Lowery Estefana RAMAN, DO;  Location: [facility];  Service: Plastics;  Laterality: Left;  Family History  Problem Relation Age of Onset   Breast cancer Mother    Cancer Mother        breast cancer or precancer   Hypertension Mother    Heart failure Mother    Hypertension Father    Melanoma Father 11   Thyroid  disease Other     Social History   Socioeconomic History   Marital status: Married    Spouse name: Not on file   Number of children: 2   Years of education: Not on file   Highest education  level: 12th grade  Occupational History   Occupation: credit reporting    Employer: [employer]    End: 01/19/2019    Comment: retired since 12/2018  Tobacco Use   Smoking status: Never   Smokeless tobacco: Never  Vaping Use   Vaping status: Never Used  Substance and Sexual Activity   Alcohol use: No   Drug use: No   Sexual activity: Yes    Partners: Male    Birth control/protection: Post-menopausal  Other Topics Concern   Not on file  Social History Narrative   Exercise-- walking    Social Drivers of Health   Tobacco Use: Low Risk (04/12/2024)   Patient History    Smoking Tobacco Use: Never    Smokeless Tobacco Use: Never    Passive Exposure: Not on file  Financial Resource Strain: Low Risk (01/29/2024)   Overall Financial Resource Strain (CARDIA)    Difficulty of Paying Living Expenses: Not hard at all  Food Insecurity: No Food Insecurity (01/29/2024)   Epic    Worried About Programme Researcher, Broadcasting/film/video in the Last Year: Never true    Ran Out of Food in the Last Year: Never true  Transportation Needs: No Transportation Needs (01/29/2024)   Epic    Lack of Transportation (Medical): No    Lack of Transportation (Non-Medical): No  Physical Activity: Insufficiently Active (01/29/2024)   Exercise Vital Sign    Days of Exercise per Week: 2 days    Minutes of Exercise per Session: 60 min  Stress: No Stress Concern Present (01/29/2024)   [employer] of Occupational Health - Occupational Stress Questionnaire    Feeling of Stress: Not at all  Social Connections: Socially Integrated (01/29/2024)   Social Connection and Isolation Panel    Frequency of Communication with Friends and Family: More than three times a week    Frequency of Social Gatherings with Friends and Family: More than three times a week    Attends Religious Services: More than 4 times per year    Active Member of [employer] or Organizations: Yes    Attends Banker Meetings: 1 to 4 times per year     Marital Status: Married  Catering Manager Violence: Not At Risk (02/05/2024)   Epic    Fear of Current or Ex-Partner: No    Emotionally Abused: No    Physically Abused: No    Sexually Abused: No  Depression (PHQ2-9): Low Risk (04/05/2024)   Depression (PHQ2-9)    PHQ-2 Score: 0  Alcohol Screen: Not on file  Housing: Low Risk (01/29/2024)   Epic    Unable to Pay for Housing in the Last Year: No    Number of Times Moved in the Last Year: 0    Homeless in the Last Year: No  Utilities: Not At Risk (02/05/2024)   Epic    Threatened with loss of utilities: No  Health Literacy: Adequate Health Literacy (  02/05/2024)   B1300 Health Literacy    Frequency of need for help with medical instructions: Never    Outpatient Medications Prior to Visit  Medication Sig Dispense Refill   anastrozole  (ARIMIDEX ) 1 MG tablet Take 1 tablet (1 mg total) by mouth daily. 90 tablet 1   ascorbic acid (VITAMIN C) 500 MG tablet Take 500 mg by mouth daily.     blood glucose meter kit and supplies KIT Dispense based on patient and insurance preference. Use up to four times daily as directed. (FOR ICD-9 250.00, 250.01). 1 each 0   carvedilol  (COREG ) 6.25 MG tablet Take 1 tablet (6.25 mg total) by mouth 2 (two) times daily with a meal. 180 tablet 1   Cholecalciferol (VITAMIN D ) 50 MCG (2000 UT) tablet Take 2,000 Units by mouth daily.     fenofibrate  54 MG tablet Take 1 tablet (54 mg total) by mouth daily. 90 tablet 1   glucose blood test strip Onetouch verio test strips. Check blood sugar once a day 100 each 0   levothyroxine  (SYNTHROID ) 112 MCG tablet Take 1 tablet (112 mcg total) by mouth daily before breakfast. Pt needs office visit or labs for further refills 90 tablet 3   metFORMIN  (GLUCOPHAGE ) 500 MG tablet TAKE 1 TABLET BY MOUTH TWICE A DAY WITH FOOD 180 tablet 1   Multiple Vitamin (MULTIVITAMIN ADULT) TABS Take 1 tablet by mouth daily.     ONETOUCH DELICA LANCETS FINE MISC 1 Device by Does not apply route daily.  100 each 0   rosuvastatin  (CRESTOR ) 10 MG tablet Take 1 tablet (10 mg total) by mouth daily. 90 tablet 1   triamcinolone  cream (KENALOG ) 0.1 % Apply 1 Application topically daily as needed. 30 g 4   Vitamin D , Ergocalciferol , (DRISDOL ) 1.25 MG (50000 UNIT) CAPS capsule TAKE 1 CAPSULE (50,000 UNITS TOTAL) BY MOUTH EVERY 7 (SEVEN) DAYS 12 capsule 1   No facility-administered medications prior to visit.    Allergies  Allergen Reactions   Other Other (See Comments)    Adhesive Strips causes blisters   Azithromycin Rash   Latex Rash    Review of Systems  Constitutional:  Negative for fever and malaise/fatigue.  HENT:  Negative for congestion.   Eyes:  Negative for blurred vision.  Respiratory:  Negative for shortness of breath.   Cardiovascular:  Negative for chest pain, palpitations and leg swelling.  Gastrointestinal:  Negative for abdominal pain, blood in stool and nausea.  Genitourinary:  Negative for dysuria and frequency.  Musculoskeletal:  Negative for falls.  Skin:  Negative for rash.  Neurological:  Negative for dizziness, loss of consciousness and headaches.  Endo/Heme/Allergies:  Negative for environmental allergies.  Psychiatric/Behavioral:  Negative for depression. The patient is not nervous/anxious.        Objective:    Physical Exam Vitals and nursing note reviewed.  Constitutional:      General: She is not in acute distress.    Appearance: Normal appearance. She is well-developed.  HENT:     Head: Normocephalic and atraumatic.  Eyes:     General: No scleral icterus.       Right eye: No discharge.        Left eye: No discharge.  Cardiovascular:     Rate and Rhythm: Normal rate and regular rhythm.     Heart sounds: No murmur heard. Pulmonary:     Effort: Pulmonary effort is normal. No respiratory distress.     Breath sounds: Normal breath sounds.  Musculoskeletal:  General: Normal range of motion.     Cervical back: Normal range of motion and neck  supple.     Right lower leg: No edema.     Left lower leg: No edema.  Skin:    General: Skin is warm and dry.  Neurological:     Mental Status: She is alert and oriented to person, place, and time.  Psychiatric:        Mood and Affect: Mood normal.        Behavior: Behavior normal.        Thought Content: Thought content normal.        Judgment: Judgment normal.     BP 118/78 (BP Location: Right Arm, Patient Position: Sitting, Cuff Size: Large)   Pulse 81   Temp 97.9 F (36.6 C) (Oral)   Resp 18   Ht 5' 6 (1.676 m)   Wt 192 lb 9.6 oz (87.4 kg)   SpO2 96%   BMI 31.09 kg/m  Wt Readings from Last 3 Encounters:  04/12/24 192 lb 9.6 oz (87.4 kg)  04/05/24 194 lb 1.6 oz (88 kg)  02/05/24 190 lb (86.2 kg)    Diabetic Foot Exam - Simple   No data filed    Lab Results  Component Value Date   WBC 4.2 04/05/2024   HGB 13.3 04/05/2024   HCT 39.7 04/05/2024   PLT 189 04/05/2024   GLUCOSE 130 (H) 04/05/2024   CHOL 113 12/12/2023   TRIG 136.0 12/12/2023   HDL 50.30 12/12/2023   LDLDIRECT 47.0 02/07/2021   LDLCALC 36 12/12/2023   ALT 18 04/05/2024   AST 22 04/05/2024   NA 142 04/05/2024   K 4.0 04/05/2024   CL 103 04/05/2024   CREATININE 0.71 04/05/2024   BUN 8 04/05/2024   CO2 27 04/05/2024   TSH 0.43 07/11/2023   HGBA1C 6.6 (H) 12/12/2023   MICROALBUR 1.3 12/12/2023    Lab Results  Component Value Date   TSH 0.43 07/11/2023   Lab Results  Component Value Date   WBC 4.2 04/05/2024   HGB 13.3 04/05/2024   HCT 39.7 04/05/2024   MCV 91.9 04/05/2024   PLT 189 04/05/2024   Lab Results  Component Value Date   NA 142 04/05/2024   K 4.0 04/05/2024   CO2 27 04/05/2024   GLUCOSE 130 (H) 04/05/2024   BUN 8 04/05/2024   CREATININE 0.71 04/05/2024   BILITOT 0.5 04/05/2024   ALKPHOS 78 04/05/2024   AST 22 04/05/2024   ALT 18 04/05/2024   PROT 7.5 04/05/2024   ALBUMIN 4.9 04/05/2024   CALCIUM  10.0 04/05/2024   ANIONGAP 12 04/05/2024   GFR 90.65 12/12/2023    Lab Results  Component Value Date   CHOL 113 12/12/2023   Lab Results  Component Value Date   HDL 50.30 12/12/2023   Lab Results  Component Value Date   LDLCALC 36 12/12/2023   Lab Results  Component Value Date   TRIG 136.0 12/12/2023   Lab Results  Component Value Date   CHOLHDL 2 12/12/2023   Lab Results  Component Value Date   HGBA1C 6.6 (H) 12/12/2023       Assessment & Plan:  Essential hypertension -     CBC with Differential/Platelet -     Comprehensive metabolic panel with GFR -     Lipid panel  Dyslipidemia -     CBC with Differential/Platelet -     Comprehensive metabolic panel with GFR -  Lipid panel  Type 2 diabetes mellitus with hyperglycemia, without long-term current use of insulin  (HCC) -     CBC with Differential/Platelet -     Comprehensive metabolic panel with GFR -     Lipid panel -     Hemoglobin A1c  Vitamin D  deficiency -     VITAMIN D  25 Hydroxy (Vit-D Deficiency, Fractures)  Hypothyroidism, unspecified type Assessment & Plan: Con't synthroid  Check labs   Orders: -     TSH  Malignant neoplasm of overlapping sites of left breast in female, estrogen receptor positive (HCC) Assessment & Plan: Per oncology   Hyperlipidemia, unspecified hyperlipidemia type Assessment & Plan: Encourage heart healthy diet such as MIND or DASH diet, increase exercise, avoid trans fats, simple carbohydrates and processed foods, consider a krill or fish or flaxseed oil cap daily.     Assessment and Plan Assessment & Plan Type 2 diabetes mellitus   Managed with metformin , she reports diarrhea possibly linked to its use. She is interested in non-invasive glucose monitoring but remains skeptical of current devices. Continue metformin  as prescribed. Consider non-invasive glucose monitoring options if reliable devices become available.  Dyslipidemia   Discussed the potential benefits of fish oil for cholesterol management. Consider fish oil  supplementation.  Vitamin D  deficiency   Managed with over-the-counter vitamin D3 supplementation. Continue current supplementation.    Natallie Ravenscroft R Lowne Chase, DO  "

## 2024-04-18 ENCOUNTER — Ambulatory Visit: Payer: Self-pay | Admitting: Family Medicine

## 2024-04-18 DIAGNOSIS — E039 Hypothyroidism, unspecified: Secondary | ICD-10-CM

## 2024-04-19 ENCOUNTER — Encounter: Payer: Self-pay | Admitting: Nurse Practitioner

## 2024-04-29 ENCOUNTER — Other Ambulatory Visit (HOSPITAL_BASED_OUTPATIENT_CLINIC_OR_DEPARTMENT_OTHER)

## 2024-05-10 ENCOUNTER — Inpatient Hospital Stay (HOSPITAL_BASED_OUTPATIENT_CLINIC_OR_DEPARTMENT_OTHER): Admission: RE | Admit: 2024-05-10 | Source: Ambulatory Visit

## 2024-05-10 ENCOUNTER — Other Ambulatory Visit (HOSPITAL_BASED_OUTPATIENT_CLINIC_OR_DEPARTMENT_OTHER)

## 2024-07-26 ENCOUNTER — Ambulatory Visit: Payer: Self-pay | Attending: General Surgery

## 2024-10-18 ENCOUNTER — Inpatient Hospital Stay

## 2024-10-18 ENCOUNTER — Inpatient Hospital Stay: Admitting: Nurse Practitioner

## 2025-02-07 ENCOUNTER — Ambulatory Visit
